# Patient Record
Sex: Female | Born: 1990 | Race: White | Hispanic: No | Marital: Married | Smoking: Former smoker
Health system: Southern US, Community
[De-identification: ages and names within clinical notes are randomized; demographics above are authoritative.]

## PROBLEM LIST (undated history)

## (undated) ENCOUNTER — Inpatient Hospital Stay (HOSPITAL_COMMUNITY): Payer: Self-pay

## (undated) DIAGNOSIS — D649 Anemia, unspecified: Secondary | ICD-10-CM

## (undated) DIAGNOSIS — R87629 Unspecified abnormal cytological findings in specimens from vagina: Secondary | ICD-10-CM

## (undated) DIAGNOSIS — N390 Urinary tract infection, site not specified: Secondary | ICD-10-CM

## (undated) DIAGNOSIS — B009 Herpesviral infection, unspecified: Secondary | ICD-10-CM

## (undated) DIAGNOSIS — O26899 Other specified pregnancy related conditions, unspecified trimester: Secondary | ICD-10-CM

## (undated) DIAGNOSIS — O039 Complete or unspecified spontaneous abortion without complication: Secondary | ICD-10-CM

## (undated) DIAGNOSIS — Z6791 Unspecified blood type, Rh negative: Secondary | ICD-10-CM

## (undated) HISTORY — DX: Complete or unspecified spontaneous abortion without complication: O03.9

## (undated) HISTORY — PX: TONSILLECTOMY: SUR1361

## (undated) HISTORY — DX: Herpesviral infection, unspecified: B00.9

## (undated) HISTORY — DX: Unspecified abnormal cytological findings in specimens from vagina: R87.629

---
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
## 2008-05-20 ENCOUNTER — Ambulatory Visit: Payer: Self-pay | Admitting: *Deleted

## 2008-05-20 ENCOUNTER — Inpatient Hospital Stay (HOSPITAL_COMMUNITY): Admission: AD | Admit: 2008-05-20 | Discharge: 2008-05-24 | Payer: Self-pay | Admitting: *Deleted

## 2008-08-12 ENCOUNTER — Inpatient Hospital Stay (HOSPITAL_COMMUNITY): Admission: AD | Admit: 2008-08-12 | Discharge: 2008-08-15 | Payer: Self-pay | Admitting: *Deleted

## 2008-08-12 ENCOUNTER — Other Ambulatory Visit: Payer: Self-pay | Admitting: *Deleted

## 2008-08-12 ENCOUNTER — Ambulatory Visit: Payer: Self-pay | Admitting: *Deleted

## 2010-06-18 LAB — URINALYSIS, ROUTINE W REFLEX MICROSCOPIC
Bilirubin Urine: NEGATIVE
Glucose, UA: NEGATIVE mg/dL
Hgb urine dipstick: NEGATIVE
Ketones, ur: NEGATIVE mg/dL
pH: 7.5 (ref 5.0–8.0)

## 2010-06-18 LAB — URINE MICROSCOPIC-ADD ON

## 2010-06-18 LAB — DIFFERENTIAL
Eosinophils Absolute: 0.1 10*3/uL (ref 0.0–0.7)
Eosinophils Relative: 1 % (ref 0–5)
Lymphs Abs: 2 10*3/uL (ref 0.7–4.0)
Monocytes Relative: 5 % (ref 3–12)

## 2010-06-18 LAB — RAPID URINE DRUG SCREEN, HOSP PERFORMED
Barbiturates: NOT DETECTED
Benzodiazepines: NOT DETECTED
Cocaine: NOT DETECTED

## 2010-06-18 LAB — CBC
HCT: 37.7 % (ref 36.0–46.0)
MCV: 79.1 fL (ref 78.0–100.0)
Platelets: 225 10*3/uL (ref 150–400)
RBC: 4.77 MIL/uL (ref 3.87–5.11)
WBC: 8.1 10*3/uL (ref 4.0–10.5)

## 2010-06-18 LAB — POCT I-STAT, CHEM 8
BUN: 12 mg/dL (ref 6–23)
Calcium, Ion: 1.23 mmol/L (ref 1.12–1.32)
Creatinine, Ser: 1 mg/dL (ref 0.4–1.2)
TCO2: 24 mmol/L (ref 0–100)

## 2010-06-21 LAB — DRUGS OF ABUSE SCREEN W/O ALC, ROUTINE URINE
Amphetamine Screen, Ur: NEGATIVE
Barbiturate Quant, Ur: NEGATIVE
Creatinine,U: 180.6 mg/dL
Marijuana Metabolite: NEGATIVE
Propoxyphene: NEGATIVE

## 2010-06-21 LAB — URINALYSIS, ROUTINE W REFLEX MICROSCOPIC
Glucose, UA: NEGATIVE mg/dL
Nitrite: NEGATIVE
Specific Gravity, Urine: 1.022 (ref 1.005–1.030)
pH: 6.5 (ref 5.0–8.0)

## 2010-06-21 LAB — CBC
HCT: 36 % (ref 36.0–46.0)
MCHC: 34.7 g/dL (ref 30.0–36.0)
MCV: 78.1 fL (ref 78.0–100.0)
RBC: 4.61 MIL/uL (ref 3.87–5.11)

## 2010-06-21 LAB — COMPREHENSIVE METABOLIC PANEL
AST: 25 U/L (ref 0–37)
BUN: 11 mg/dL (ref 6–23)
CO2: 25 mEq/L (ref 19–32)
Calcium: 9.5 mg/dL (ref 8.4–10.5)
Creatinine, Ser: 0.81 mg/dL (ref 0.4–1.2)
GFR calc Af Amer: >60 mL/min (ref >60)
GFR calc non Af Amer: >60 mL/min (ref >60)

## 2010-06-21 LAB — DIFFERENTIAL
Eosinophils Relative: 2 % (ref 0–5)
Lymphocytes Relative: 31 % (ref 12–46)
Lymphs Abs: 2.5 10*3/uL (ref 0.7–4.0)
Neutro Abs: 4.8 10*3/uL (ref 1.7–7.7)
Neutrophils Relative %: 60 % (ref 43–77)

## 2010-06-21 LAB — URINE MICROSCOPIC-ADD ON

## 2010-06-21 LAB — TSH: TSH: 2.243 u[IU]/mL (ref 0.350–4.500)

## 2010-07-24 NOTE — Discharge Summary (Signed)
[name], [name] NO.:  [id number]   MEDICAL RECORD NO.:  [id number]          PATIENT TYPE:  IPS   LOCATION:  0307                          FACILITY:  [facility]   PHYSICIAN:  Jasmine Pang, M.D. DATE OF BIRTH:  February 27, 1991   DATE OF ADMISSION:  08/12/2008  DATE OF DISCHARGE:  08/15/2008                               DISCHARGE SUMMARY   IDENTIFYING INFORMATION:  A 20 year old female, single.  This is a  voluntary admission.   HISTORY OF PRESENT ILLNESS:  This the second [facility] admission for this 34-  year-old mother of an 95-month-old daughter who took an intentional  overdose of 100 mg of Lexapro and about 25 tablets of Vistaril 25 mg.  She reports that she was agitated after 2-3 days of first arguing with  her mother, then her mother refusing to call her back and ignoring her.  She has had a conflictual relationship with her mother who she  recognizes is a substance abuser and has issues of manipulating the  relationship on a chronic basis.  Kristina [name] regrets her overdose.  Recognizes that her reaction was inappropriate.  She had several weeks  ago stopped going to counseling about the relationship with her mother  and had also stopped her Lexapro.   PAST PSYCHIATRIC HISTORY:  Second [facility] admission.  Previously here in  March 2010.  At that time, she was discharged on Lexapro 10 mg daily and  was referred for counseling.  She kept only a couple of appointments and  did not follow through.  She has a history of depression centering  around the chronically conflictual relationship with her mother.  She  also became pregnant; her boyfriend left her, and she is raising the 42-  month-old daughter with the support of her aunt and uncle with whom she  lives.  She is a Engineer, agricultural.   SOCIAL HISTORY:  Single Caucasian female, unemployed, raising an 20-month-  old daughter named Kristina [name], living with supportive aunt and uncle.   FAMILY HISTORY:  Mother with history of mood  problems and substance  abuse.   MEDICAL HISTORY:  Primary care [facility] is her OB/GYN, Dr. Tamela Oddi.  Medical problems are status post SSRI and anticholinergic  overdose.   CURRENT MEDICATIONS:  None.   DRUG ALLERGIES:  None.   PHYSICAL EXAMINATION:  GENERAL:  Physical exam done in the emergency  room as noted in the record.   DIAGNOSTIC STUDIES:  Remarkable for normal CBC and electrolytes,  acetaminophen level less than 10, salicylate level less than 4, alcohol  level less than 5 and routine urinalysis normal.  Urine drug screen was  negative for all substances.   ADMITTING MENTAL STATUS EXAMINATION:  Revealed a fully alert female in  full contact with reality, good insight, good eye contact, cooperative,  regretting her overdose, recognized that her reaction was inappropriate,  wanting to be home with her daughter, wanting to resume outpatient  counseling to cope better.  No evidence of psychosis or homicidal  thought.   ADMITTING DIAGNOSES:  AXIS I:  Depressive disorder,  not otherwise  specified.  AXIS II:  No diagnosis.  AXIS III:  Status post hydroxyzine and selective serotonin reuptake  inhibitor overdose.  AXIS IV:  Moderate chronic relationship stress.  AXIS V:  Current 54, past year 64.   COURSE OF HOSPITALIZATION:  The patient was admitted to our grief and  loss group.  We were able to hear from her family with whom our social  worker spoke.  She wants to follow up with counseling and felt that it  was not necessarily helpful to be on the Lexapro, was not sure that it  really helped her, felt that she got more benefit from the counseling  sessions.  We did restart her on some trazodone to help her sleep at  night.  She did have some post-overdose headache and was given ibuprofen  600 mg q.6 h. p.r.n. which controlled her symptoms.  By Monday, August 15, 2008, she was having no suicidal thoughts, up and about, participating  in all activities, headache  resolved, was appropriate with staff and  peers and good participation in groups, requesting to go home and follow  up with counseling.   DISCHARGE MENTAL STATUS EXAMINATION:  Fully alert, good eye contact,  good grooming.  Speech normal, giving a coherent history.  Insight  improved.  No dangerous thoughts, asking to go home to care for her  daughter.   DISCHARGE DIAGNOSES:  AXIS I:  Depressive disorder, not otherwise  specified.  AXIS II:  No diagnosis.  AXIS III:  Status post anticholinergic and selective serotonin reuptake  inhibitor overdose.  AXIS IV:  Moderate chronic relationship stressors and parenting  stressors with limited resources.  AXIS V:  Current 60, past year 74.   PLAN:  Discharge her today.  She is going home on no medications and  will follow up with [facility] [place] for counseling.  Their phone number is [phone].      Margaret A. Lorin Picket, N.P.      Jasmine Pang, M.D.  Electronically Signed    MAS/MEDQ  D:  08/15/2008  T:  08/15/2008  Job:  [id number]

## 2010-07-27 NOTE — Discharge Summary (Signed)
Kristina [name], CAUTHON NO.:  [id number]   MEDICAL RECORD NO.:  [id number]          PATIENT TYPE:  IPS   LOCATION:  0300                          FACILITY:  [facility]   PHYSICIAN:  Jasmine Pang, M.D. DATE OF BIRTH:  Jul 30, 1990   DATE OF ADMISSION:  05/20/2008  DATE OF DISCHARGE:  05/24/2008                               DISCHARGE SUMMARY   IDENTIFYING INFORMATION:  This was a 20 year old single white female who  was admitted on a voluntary basis on May 20, 2008.   HISTORY OF PRESENT ILLNESS:  The patient tried to overdose on an unknown  amount of pills 1 week ago.  She got no treatment for this.  This is the  first [facility] admission for her.  She reports being overwhelmed by multiple  stressors in her life.  She describes a lot of drama.  She states her  mother was using drugs.  So, the patient lives with her aunt.  She was  falsely accused of stealing from her step mother and accused her of  running away.  She had panic attacks 2 times a week.  She states she is  constantly anxious.  She has a 72-month-old daughter and baby's father  has abandoned home.   PAST PSYCHIATRIC HISTORY:  The patient previously took Adderall and  Ritalin.  She lost her appetite, was withdrawn though it helped with  concentration.  She states she has attempted to kill herself before.   FAMILY HISTORY:  Mother has a history of substance abuse.   ALCOHOL AND DRUG HISTORY:  The patient did cocaine for 3 months 1 year  ago.  She denies any alcohol now.  Two weeks ago, she did use THC.   MEDICAL PROBLEMS:  None.   MEDICATIONS:  Depo-Provera, due May 23, 2008.   ALLERGIES:  No known drug allergies.   PHYSICAL FINDINGS:  There were no acute physical or medical problems  noted on exam.   HOSPITAL COURSE:  Upon admission, the patient was started on Ambien 5 mg  p.o. q.h.s. p.r.n. and nicotine 14 mg patch daily per protocol.  On  May 21, 2008, the patient's CBC with differential was within  normal  limits.  Comprehensive metabolic panel was grossly within normal limits  except for an elevated glucose at 103.  TSH was within normal limits at  2.243 (0.35 to 4.5).  Urinalysis was negative for except for moderate  leukocytes, wbc's were 7-10 per high-power field.  Urine drug screen was  negative.  She was not having any symptoms of bladder infection and at  this point was not treated for one.  On May 23, 2008, she was started  on Depo-Provera, may use own supply.  In individual sessions, the  patient was friendly and cooperative.  She stated she had not been  sleeping well.  Prior to admission, she stated she has taken an overdose  for 5 times and randomly cuts herself.  On May 22, 2008, the patient  stated she had panic attack when her mother started to live, was  controlled with p.r.n.  medicines.  She states she got overwhelmed.  Lexapro 5 mg p.o. daily was begun along with Ativan 1 mg p.o. q.6 hours  p.r.n. anxiety.  She was tolerating these medications well.  She remains  concerned that she cannot handle all the stressors at home and multiple  demands and criticisms.  She worries about her mother's health.  On  May 23, 2008, sleep was good.  Mood was less depressed, less anxious.  There was no suicidal ideation.  Mother visited today and she enjoyed  this.  The patient will go home to live with her aunt and uncle.  It was  anticipated that she would be discharged tomorrow.  On May 24, 2008,  the patient's sleep was good, appetite was good.  Mood was less  depressed and less anxious.  Affect was consistent with mood.  There was  no suicidal or homicidal ideation.  No thoughts of self-injurious  behavior.  No auditory or visual hallucinations.  No paranoia or  delusions.  Thoughts were logical and goal-directed, thought content.  No predominant theme.  Cognitive was grossly intact.  Insight good,  judgment good, impulse control good.  It was felt the patient was safe   for discharge.  She was started on Vistaril 25 mg p.o. q.6 hours p.r.n.  anxiety (since she asked for medication for anxiety).   DISCHARGE DIAGNOSES:  Axis I:  Depressive disorder, not otherwise  specified.  Axis II:  None.  Axis III:  No diagnosis.  Axis IV:  Severe (family and social support issues, burden of  psychiatric illness).  Axis V:  Global assessment of functioning on discharge was 55.  GAF upon  admission was 48.  GAF highest past year was 65.   DISCHARGE PLANS:  There was no specific activity level or dietary  restrictions.   POSTHOSPITAL CARE PLANS:  The patient will go to [facility] on  May 31, 2008, at 10 a.m.  She will also go to [facility]  [place] for therapy.   DISCHARGE MEDICATIONS:  1. Lexapro was increased to 10 mg p.o. daily.  2. Ambien 5 mg at bedtime.  3. Vistaril 25 mg every 6 hours if needed for anxiety.      Jasmine Pang, M.D.  Electronically Signed     BHS/MEDQ  D:  05/25/2008  T:  05/26/2008  Job:  [id number]

## 2010-11-30 LAB — URINALYSIS, ROUTINE W REFLEX MICROSCOPIC
Bilirubin Urine: NEGATIVE
Hgb urine dipstick: NEGATIVE
Ketones, ur: NEGATIVE
Nitrite: NEGATIVE
Urobilinogen, UA: 0.2

## 2010-11-30 LAB — CBC
HCT: 34.3 — ABNORMAL LOW
Hemoglobin: 11.9 — ABNORMAL LOW
MCV: 83.6
Platelets: 196
WBC: 6.9

## 2010-11-30 LAB — RH IMMUNE GLOBULIN WORKUP (NOT WOMEN'S HOSP)
ABO/RH(D): A NEG
Antibody Screen: NEGATIVE

## 2010-11-30 LAB — WET PREP, GENITAL

## 2010-11-30 LAB — POCT PREGNANCY, URINE: Operator id: 27324

## 2010-12-06 LAB — URINALYSIS, ROUTINE W REFLEX MICROSCOPIC
Bilirubin Urine: NEGATIVE
Glucose, UA: 100 — AB
Hgb urine dipstick: NEGATIVE
Specific Gravity, Urine: 1.01
Urobilinogen, UA: 0.2

## 2010-12-06 LAB — RH IMMUNE GLOBULIN WORKUP (NOT WOMEN'S HOSP)
ABO/RH(D): A NEG
Antibody Screen: NEGATIVE

## 2010-12-06 LAB — URINE MICROSCOPIC-ADD ON

## 2010-12-10 LAB — RH IMMUNE GLOB WKUP(>/=20WKS)(NOT WOMEN'S HOSP): Fetal Screen: NEGATIVE

## 2010-12-10 LAB — CBC
HCT: 34 — ABNORMAL LOW
HCT: 25.5 — ABNORMAL LOW
Hemoglobin: 11.2 — ABNORMAL LOW
Hemoglobin: 13.2
Hemoglobin: 8.3 — ABNORMAL LOW
MCHC: 33.1
MCHC: 33.2
MCHC: 32.4
MCV: 84.7
MCV: 85.2
Platelets: 160
Platelets: 112 — ABNORMAL LOW
RBC: 4.01
RBC: 4.73
RDW: 15.8 — ABNORMAL HIGH
RDW: 16.2 — ABNORMAL HIGH
WBC: 12.1 — ABNORMAL HIGH
WBC: 9.5

## 2010-12-10 LAB — RPR: RPR Ser Ql: NONREACTIVE

## 2010-12-12 LAB — RH IMMUNE GLOBULIN WORKUP (NOT WOMEN'S HOSP)
ABO/RH(D): A NEG
Antibody Screen: POSITIVE
DAT, IgG: NEGATIVE

## 2010-12-26 LAB — POCT URINALYSIS DIP (DEVICE)
Bilirubin Urine: NEGATIVE
Glucose, UA: NEGATIVE
Hgb urine dipstick: NEGATIVE
Operator id: 239701
Specific Gravity, Urine: 1.01
Urobilinogen, UA: 0.2

## 2013-02-18 ENCOUNTER — Other Ambulatory Visit: Payer: Self-pay | Admitting: Obstetrics & Gynecology

## 2013-02-18 DIAGNOSIS — O3680X0 Pregnancy with inconclusive fetal viability, not applicable or unspecified: Secondary | ICD-10-CM

## 2013-02-22 ENCOUNTER — Ambulatory Visit (INDEPENDENT_AMBULATORY_CARE_PROVIDER_SITE_OTHER): Payer: Medicaid Other

## 2013-02-22 ENCOUNTER — Other Ambulatory Visit: Payer: Self-pay | Admitting: Obstetrics & Gynecology

## 2013-02-22 ENCOUNTER — Encounter: Payer: Self-pay | Admitting: Obstetrics & Gynecology

## 2013-02-22 DIAGNOSIS — O3680X0 Pregnancy with inconclusive fetal viability, not applicable or unspecified: Secondary | ICD-10-CM

## 2013-02-22 NOTE — Progress Notes (Signed)
U/S(10+2wks)-transvaginal u/s performed, retroverted uterus noted, single IUP with +FCA noted, FHR- 189bpm, cx long and closed, bilateral adnexa wnl with C.L. Noted on Lt-3.3 x 3.2cm, CRL c/w LMP dates

## 2013-02-24 ENCOUNTER — Other Ambulatory Visit: Payer: Self-pay | Admitting: Obstetrics & Gynecology

## 2013-02-24 DIAGNOSIS — Z36 Encounter for antenatal screening of mother: Secondary | ICD-10-CM

## 2013-03-02 ENCOUNTER — Encounter: Payer: Self-pay | Admitting: Women's Health

## 2013-03-02 ENCOUNTER — Telehealth: Payer: Self-pay | Admitting: Obstetrics & Gynecology

## 2013-03-02 ENCOUNTER — Ambulatory Visit (INDEPENDENT_AMBULATORY_CARE_PROVIDER_SITE_OTHER): Payer: Medicaid Other | Admitting: Women's Health

## 2013-03-02 VITALS — BP 128/60 | Ht 67.0 in | Wt 221.2 lb

## 2013-03-02 DIAGNOSIS — L52 Erythema nodosum: Secondary | ICD-10-CM

## 2013-03-02 DIAGNOSIS — O9989 Other specified diseases and conditions complicating pregnancy, childbirth and the puerperium: Secondary | ICD-10-CM

## 2013-03-02 NOTE — Telephone Encounter (Signed)
Pt states had raised, red knots, with heat on bilateral legs for several days. Pt states has only been seen in our office for ob u/s. Per Cyril Mourning, NP pt to be seen. Informed pt to come in now to be worked in to schedule. Pt verbalized understanding.

## 2013-03-02 NOTE — Progress Notes (Signed)
Patient ID: Kristina [name], female   DOB: [birth year], 22 y.o.   MRN: [id number]   [facility] Visit  Patient name: Kristina [name] MRN [id number]  Date of birth: 05-May-1990  CC & HPI:  Kristina [name] is a 22 y.o. G1P0 Caucasian female at [redacted]w[redacted]d presenting today w/ report of red painful bumps on bilateral shins x few weeks.  The redness increases at night. They do not itch, have not come to a head, denies fever/chills, any change in medications other than beginning diclegis for n/v, but bumps were there prior to beginning this. Has never had this before. States her family was concerned, so she saw her PCP yesterday who told her she didn't know what they were. She has not seen a dermatologist.  She is scheduled next week for her new ob visit.   Pertinent History Reviewed:  Medical & Surgical Hx:   History reviewed. No pertinent past medical history. Past Surgical History  Procedure Laterality Date  . Tonsilectomy, adenoidectomy, bilateral myringotomy and tubes     Medications: Reviewed & Updated - see associated section Social History: Reviewed -  reports that she has quit smoking. She has never used smokeless tobacco.  Objective Findings:  Vitals: BP 128/60  Ht 5\' 7"  (1.702 m)  Wt 221 lb 3.2 oz (100.336 kg)  BMI 34.64 kg/m2  LMP 12/12/2012  Physical Examination: General appearance - alert, well appearing, and in no distress Extremities: multiple large ~2cm raised tender erythematous bumps bilateral distal shins, not hot to touch, no heads. Co-exam w/ JAG, possibly erythema nodosum?   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 Margo Aye to make appt, but office closed  To elevate legs, can try zyrtec or claritin if she would like  Pt to call Dr. Margo Aye for appt  To notify us/go to hospital if areas worsen, she develops fever/chills, etc  F/U 1wk for new ob visit as scheduled   Marge Duncans CNM,  [facility] 03/02/2013 2:43 PM

## 2013-03-02 NOTE — Patient Instructions (Signed)
Erythema Nodosum Erythema nodosum is also called "painful red nodules on the legs". Symptoms can include sudden onset of fever, fatigue and joint pains. Red, deep, tender, raised, bruise-like bumps form on the shin, or sometimes on the arms or trunk. The skin over the bumps is usually shiny. The symptoms usually last 1-2 weeks. The symptoms usually improve once the cause is treated. This illness may be caused by strep or fungal infection, drug reaction, pregnancy, or other medical conditions. Treating the cause is important to early recovery. Erythema nodosum occurs at any age and in both sexes but more often in young adult women. Erythema nodosum is not a skin infection. It is a reaction to something internal. In most cases the illness and bumps go away with treatment. You should avoid any medicine that may have caused this reaction. Antihistamine drugs, anti-inflammatory drugs or cortisone medicine may be prescribed to relieve symptoms. SSKI drops ([place]) taken with juice at breakfast, lunch and dinner may have an anti-inflammatory effect and speed the healing. Bed rest and limiting vigorous exercise help to shorten the course of erythema nodosum. Elevation of the affected limb also helps in recovery. As the condition gets better, the bumps flatten and your skin may heal with temporary bruise marks. These dark marks will clear up in several months and they are a good sign that your skin is healing.  SEEK IMMEDIATE MEDICAL CARE IF:   Your condition worsens, or if you have more severe symptoms such a high fever, sore throat, or repeated vomiting. Document Released: 04/04/2004 Document Revised: 05/20/2011 Document Reviewed: 04/08/2008 [facility] Patient Information 2014 [place], [place].  Nausea & Vomiting  Have saltine crackers or pretzels by your bed and eat a few bites before you raise your head out of bed in the morning  Eat small frequent meals throughout the day instead of large meals  Drink plenty  of fluids throughout the day to stay hydrated, just don't drink a lot of fluids with your meals.  This can make your stomach fill up faster making you feel sick  Do not brush your teeth right after you eat  Products with real ginger are good for nausea, like ginger ale and ginger hard candy Make sure it says made with real ginger!  Sucking on sour candy like lemon heads is also good for nausea  If your prenatal vitamins make you nauseated, take them at night so you will sleep through the nausea  If you feel like you need medicine for the nausea & vomiting please let us know  If you are unable to keep any fluids or food down please let us know    Pregnancy - First Trimester During sexual intercourse, millions of sperm go into the vagina. Only 1 sperm will penetrate and fertilize the female egg while it is in the Fallopian tube. One week later, the fertilized egg implants into the wall of the uterus. An embryo begins to develop into a baby. At 6 to 8 weeks, the eyes and face are formed and the heartbeat can be seen on ultrasound. At the end of 12 weeks (first trimester), all the baby's organs are formed. Now that you are pregnant, you will want to do everything you can to have a healthy baby. Two of the most important things are to get good prenatal care and follow your caregiver's instructions. Prenatal care is all the medical care you receive before the baby's birth. It is given to prevent, find, and treat problems during the pregnancy  and childbirth. PRENATAL EXAMS  During prenatal visits, your weight, blood pressure, and urine are checked. This is done to make sure you are healthy and progressing normally during the pregnancy.  A pregnant woman should gain 25 to 35 pounds during the pregnancy. However, if you are overweight or underweight, your caregiver will advise you regarding your weight.  Your caregiver will ask and answer questions for you.  Blood work, cervical cultures, other  necessary tests, and a Pap test are done during your prenatal exams. These tests are done to check on your health and the probable health of your baby. Tests are strongly recommended and done for HIV with your permission. This is the virus that causes AIDS. These tests are done because medicines can be given to help prevent your baby from being born with this infection should you have been infected without knowing it. Blood work is also used to find out your blood type, previous infections, and follow your blood levels (hemoglobin).  Low hemoglobin (anemia) is common during pregnancy. Iron and vitamins are given to help prevent this. Later in the pregnancy, blood tests for diabetes will be done along with any other tests if any problems develop.  You may need other tests to make sure you and the baby are doing well. CHANGES DURING THE FIRST TRIMESTER  Your body goes through many changes during pregnancy. They vary from person to person. Talk to your caregiver about changes you notice and are concerned about. Changes can include:  Your menstrual period stops.  The egg and sperm carry the genes that determine what you look like. Genes from you and your partner are forming a baby. The female genes determine whether the baby is a boy or a girl.  Your body increases in girth and you may feel bloated.  Feeling sick to your stomach (nauseous) and throwing up (vomiting). If the vomiting is uncontrollable, call your caregiver.  Your breasts will begin to enlarge and become tender.  Your nipples may stick out more and become darker.  The need to urinate more. Painful urination may mean you have a bladder infection.  Tiring easily.  Loss of appetite.  Cravings for certain kinds of food.  At first, you may gain or lose a couple of pounds.  You may have changes in your emotions from day to day (excited to be pregnant or concerned something may go wrong with the pregnancy and baby).  You may have  more vivid and strange dreams. HOME CARE INSTRUCTIONS   It is very important to avoid all smoking, alcohol and non-prescribed drugs during your pregnancy. These affect the formation and growth of the baby. Avoid chemicals while pregnant to ensure the delivery of a healthy infant.  Start your prenatal visits by the 12th week of pregnancy. They are usually scheduled monthly at first, then more often in the last 2 months before delivery. Keep your caregiver's appointments. Follow your caregiver's instructions regarding medicine use, blood and lab tests, exercise, and diet.  During pregnancy, you are providing food for you and your baby. Eat regular, well-balanced meals. Choose foods such as meat, fish, milk and other low fat dairy products, vegetables, fruits, and whole-grain breads and cereals. Your caregiver will tell you of the ideal weight gain.  You can help morning sickness by keeping soda crackers at the bedside. Eat a couple before arising in the morning. You may want to use the crackers without salt on them.  Eating 4 to 5 small meals rather  than 3 large meals a day also may help the nausea and vomiting.  Drinking liquids between meals instead of during meals also seems to help nausea and vomiting.  A physical sexual relationship may be continued throughout pregnancy if there are no other problems. Problems may be early (premature) leaking of amniotic fluid from the membranes, vaginal bleeding, or belly (abdominal) pain.  Exercise regularly if there are no restrictions. Check with your caregiver or physical therapist if you are unsure of the safety of some of your exercises. Greater weight gain will occur in the last 2 trimesters of pregnancy. Exercising will help:  Control your weight.  Keep you in shape.  Prepare you for labor and delivery.  Help you lose your pregnancy weight after you deliver your baby.  Wear a good support or jogging bra for breast tenderness during pregnancy.  This may help if worn during sleep too.  Ask when prenatal classes are available. Begin classes when they are offered.  Do not use hot tubs, steam rooms, or saunas.  Wear your seat belt when driving. This protects you and your baby if you are in an accident.  Avoid raw meat, uncooked cheese, cat litter boxes, and soil used by cats throughout the pregnancy. These carry germs that can cause birth defects in the baby.  The first trimester is a good time to visit your dentist for your dental health. Getting your teeth cleaned is okay. Use a softer toothbrush and brush gently during pregnancy.  Ask for help if you have financial, counseling, or nutritional needs during pregnancy. Your caregiver will be able to offer counseling for these needs as well as refer you for other special needs.  Do not take any medicines or herbs unless told by your caregiver.  Inform your caregiver if there is any mental or physical domestic violence.  Make a list of emergency phone numbers of family, friends, hospital, and police and fire departments.  Write down your questions. Take them to your prenatal visit.  Do not douche.  Do not cross your legs.  If you have to stand for long periods of time, rotate you feet or take small steps in a circle.  You may have more vaginal secretions that may require a sanitary pad. Do not use tampons or scented sanitary pads. MEDICINES AND DRUG USE IN PREGNANCY  Take prenatal vitamins as directed. The vitamin should contain 1 milligram of folic acid. Keep all vitamins out of reach of children. Only a couple vitamins or tablets containing iron may be fatal to a baby or young child when ingested.  Avoid use of all medicines, including herbs, over-the-counter medicines, not prescribed or suggested by your caregiver. Only take over-the-counter or prescription medicines for pain, discomfort, or fever as directed by your caregiver. Do not use aspirin, ibuprofen, or naproxen unless  directed by your caregiver.  Let your caregiver also know about herbs you may be using.  Alcohol is related to a number of birth defects. This includes fetal alcohol syndrome. All alcohol, in any form, should be avoided completely. Smoking will cause low birth rate and premature babies.  Street or illegal drugs are very harmful to the baby. They are absolutely forbidden. A baby born to an addicted mother will be addicted at birth. The baby will go through the same withdrawal an adult does.  Let your caregiver know about any medicines that you have to take and for what reason you take them. SEEK MEDICAL CARE IF:  You have  any concerns or worries during your pregnancy. It is better to call with your questions if you feel they cannot wait, rather than worry about them. SEEK IMMEDIATE MEDICAL CARE IF:   An unexplained oral temperature above 102 F (38.9 C) develops, or as your caregiver suggests.  You have leaking of fluid from the vagina (birth canal). If leaking membranes are suspected, take your temperature and inform your caregiver of this when you call.  There is vaginal spotting or bleeding. Notify your caregiver of the amount and how many pads are used.  You develop a bad smelling vaginal discharge with a change in the color.  You continue to feel sick to your stomach (nauseated) and have no relief from remedies suggested. You vomit blood or coffee ground-like materials.  You lose more than 2 pounds of weight in 1 week.  You gain more than 2 pounds of weight in 1 week and you notice swelling of your face, hands, feet, or legs.  You gain 5 pounds or more in 1 week (even if you do not have swelling of your hands, face, legs, or feet).  You get exposed to [place] measles and have never had them.  You are exposed to fifth disease or chickenpox.  You develop belly (abdominal) pain. Round ligament discomfort is a common non-cancerous (benign) cause of abdominal pain in pregnancy. Your  caregiver still must evaluate this.  You develop headache, fever, diarrhea, pain with urination, or shortness of breath.  You fall or are in a car accident or have any kind of trauma.  There is mental or physical violence in your home. Document Released: 02/19/2001 Document Revised: 11/20/2011 Document Reviewed: 08/23/2008 [facility] Patient Information 2014 [place], [place].

## 2013-03-09 ENCOUNTER — Other Ambulatory Visit (HOSPITAL_COMMUNITY)
Admission: RE | Admit: 2013-03-09 | Discharge: 2013-03-09 | Disposition: A | Discharge status: Home / Self Care | Payer: Medicaid Other | Source: Ambulatory Visit | Attending: Obstetrics and Gynecology | Admitting: Obstetrics and Gynecology

## 2013-03-09 ENCOUNTER — Ambulatory Visit (INDEPENDENT_AMBULATORY_CARE_PROVIDER_SITE_OTHER): Payer: Medicaid Other

## 2013-03-09 ENCOUNTER — Encounter: Payer: Self-pay | Admitting: Advanced Practice Midwife

## 2013-03-09 ENCOUNTER — Other Ambulatory Visit: Payer: Self-pay | Admitting: Advanced Practice Midwife

## 2013-03-09 ENCOUNTER — Ambulatory Visit (INDEPENDENT_AMBULATORY_CARE_PROVIDER_SITE_OTHER): Payer: Medicaid Other | Admitting: Advanced Practice Midwife

## 2013-03-09 VITALS — BP 120/60 | Wt 217.0 lb

## 2013-03-09 DIAGNOSIS — Z331 Pregnant state, incidental: Secondary | ICD-10-CM

## 2013-03-09 DIAGNOSIS — Z36 Encounter for antenatal screening of mother: Secondary | ICD-10-CM

## 2013-03-09 DIAGNOSIS — Z1389 Encounter for screening for other disorder: Secondary | ICD-10-CM

## 2013-03-09 DIAGNOSIS — Z3480 Encounter for supervision of other normal pregnancy, unspecified trimester: Secondary | ICD-10-CM | POA: Insufficient documentation

## 2013-03-09 DIAGNOSIS — Z01419 Encounter for gynecological examination (general) (routine) without abnormal findings: Secondary | ICD-10-CM | POA: Insufficient documentation

## 2013-03-09 DIAGNOSIS — O360990 Maternal care for other rhesus isoimmunization, unspecified trimester, not applicable or unspecified: Secondary | ICD-10-CM

## 2013-03-09 DIAGNOSIS — Z3490 Encounter for supervision of normal pregnancy, unspecified, unspecified trimester: Secondary | ICD-10-CM

## 2013-03-09 LAB — POCT URINALYSIS DIPSTICK
Blood, UA: NEGATIVE
Glucose, UA: NEGATIVE
Leukocytes, UA: NEGATIVE
Nitrite, UA: NEGATIVE

## 2013-03-09 LAB — CBC
HCT: 38 % (ref 36.0–46.0)
MCV: 80.9 fL (ref 78.0–100.0)
Platelets: 241 10*3/uL (ref 150–400)
RBC: 4.7 MIL/uL (ref 3.87–5.11)
RDW: 16.3 % — ABNORMAL HIGH (ref 11.5–15.5)
WBC: 8.4 10*3/uL (ref 4.0–10.5)

## 2013-03-09 MED ORDER — DOXYLAMINE-PYRIDOXINE 10-10 MG PO TBEC: 2.0000 | DELAYED_RELEASE_TABLET | Freq: Every day | ORAL | Status: DC

## 2013-03-09 NOTE — Progress Notes (Signed)
  Subjective:    Kristina [name] is a G2P1001 [redacted]w[redacted]d being seen today for her first obstetrical visit.  Her obstetrical history is significant for nothing.  Pregnancy history fully reviewed. Diclegis helps.  Rx sent  Patient reports no complaints.  Filed Vitals:   03/09/13 1357  BP: 120/60  Weight: 217 lb (98.431 kg)    HISTORY: OB History  Gravida Para Term Preterm AB SAB TAB Ectopic Multiple Living  2 1 1       1     # Outcome Date GA Lbr Len/2nd Weight Sex Delivery Anes PTL Lv  2 CUR           1 TRM 12/09/07             Past Medical History  Diagnosis Date  . Medical history non-contributory    Past Surgical History  Procedure Laterality Date  . Tonsilectomy, adenoidectomy, bilateral myringotomy and tubes     Family History  Problem Relation Age of Onset  . Cancer Maternal Grandmother     breast  . Diabetes Paternal Grandfather      Exam       Pelvic Exam:    Perineum: Normal Perineum   Vulva: normal   Vagina:  normal mucosa, normal discharge, no palpable nodules   Uterus    u/s normal today.  FHR +     Cervix: normal   Adnexa: Not palpable   Urinary:  urethral meatus normal    System: Breast:  normal appearance, no masses or tenderness   Skin: normal coloration and turgor, no rashes    Neurologic: oriented, normal, normal mood   Extremities: normal strength, tone, and muscle mass.  Bilateral erythema nodosum   HEENT PERRLA   Mouth/Teeth mucous membranes moist, pharynx normal without lesions   Neck supple and no masses   Cardiovascular: regular rate and rhythm   Respiratory:  appears well, vitals normal, no respiratory distress, acyanotic, normal RR   Abdomen: soft, non-tender; bowel sounds normal; no masses,  no organomegaly          Assessment:    Pregnancy: G2P1001 Patient Active Problem List   Diagnosis Date Noted  . Pregnant 03/09/2013        Plan:  NT/IT today  Initial labs drawn. Prenatal vitamins. Problem list reviewed and  updated. Genetic Screening discussed Integrated Screen: requested.  Ultrasound discussed; fetal survey: requested.  Follow up in 4 weeks.  CRESENZO-DISHMAN,Zarie Kosiba 03/09/2013

## 2013-03-09 NOTE — Progress Notes (Addendum)
U/S(12+3wks)-single IUP with +FCA noted, FHR-178 bpm, CRL c/w dates, cx long and closed(3.3cm), bilateral adnexa wnl Lt ovary with 3.5cm simple cyst noted, NB present, NT-1.2mm

## 2013-03-10 LAB — URINE CULTURE
Colony Count: NO GROWTH
Organism ID, Bacteria: NO GROWTH

## 2013-03-10 LAB — DRUG SCREEN, URINE, NO CONFIRMATION
Barbiturate Quant, Ur: NEGATIVE
Cocaine Metabolites: NEGATIVE
Marijuana Metabolite: NEGATIVE
Opiate Screen, Urine: NEGATIVE
Phencyclidine (PCP): NEGATIVE

## 2013-03-10 LAB — RPR

## 2013-03-10 LAB — URINALYSIS, ROUTINE W REFLEX MICROSCOPIC
Bilirubin Urine: NEGATIVE
Glucose, UA: NEGATIVE mg/dL
Ketones, ur: NEGATIVE mg/dL
Specific Gravity, Urine: 1.015 (ref 1.005–1.030)
pH: 8 (ref 5.0–8.0)

## 2013-03-10 LAB — ABO AND RH: Rh Type: NEGATIVE

## 2013-03-10 LAB — HEPATITIS B SURFACE ANTIGEN: Hepatitis B Surface Ag: NEGATIVE

## 2013-03-10 LAB — HIV ANTIBODY (ROUTINE TESTING W REFLEX): HIV: NONREACTIVE

## 2013-03-12 LAB — CYSTIC FIBROSIS DIAGNOSTIC STUDY

## 2013-03-15 LAB — MATERNAL SCREEN, INTEGRATED #1

## 2013-04-06 ENCOUNTER — Other Ambulatory Visit: Payer: Self-pay | Admitting: Women's Health

## 2013-04-06 ENCOUNTER — Encounter: Payer: Self-pay | Admitting: Women's Health

## 2013-04-06 ENCOUNTER — Ambulatory Visit (INDEPENDENT_AMBULATORY_CARE_PROVIDER_SITE_OTHER): Payer: Medicaid Other | Admitting: Women's Health

## 2013-04-06 VITALS — BP 122/60 | Wt 214.8 lb

## 2013-04-06 DIAGNOSIS — Z1389 Encounter for screening for other disorder: Secondary | ICD-10-CM

## 2013-04-06 DIAGNOSIS — O360990 Maternal care for other rhesus isoimmunization, unspecified trimester, not applicable or unspecified: Secondary | ICD-10-CM

## 2013-04-06 DIAGNOSIS — Z23 Encounter for immunization: Secondary | ICD-10-CM

## 2013-04-06 DIAGNOSIS — Z331 Pregnant state, incidental: Secondary | ICD-10-CM

## 2013-04-06 DIAGNOSIS — Z6791 Unspecified blood type, Rh negative: Secondary | ICD-10-CM | POA: Insufficient documentation

## 2013-04-06 DIAGNOSIS — R87619 Unspecified abnormal cytological findings in specimens from cervix uteri: Secondary | ICD-10-CM | POA: Insufficient documentation

## 2013-04-06 DIAGNOSIS — O9989 Other specified diseases and conditions complicating pregnancy, childbirth and the puerperium: Secondary | ICD-10-CM

## 2013-04-06 DIAGNOSIS — O99891 Other specified diseases and conditions complicating pregnancy: Secondary | ICD-10-CM

## 2013-04-06 DIAGNOSIS — Z3480 Encounter for supervision of other normal pregnancy, unspecified trimester: Secondary | ICD-10-CM

## 2013-04-06 DIAGNOSIS — O26899 Other specified pregnancy related conditions, unspecified trimester: Secondary | ICD-10-CM

## 2013-04-06 LAB — POCT URINALYSIS DIPSTICK
Glucose, UA: NEGATIVE
Nitrite, UA: NEGATIVE
PROTEIN UA: NEGATIVE

## 2013-04-06 MED ORDER — INFLUENZA VAC SPLIT QUAD 0.5 ML IM SUSP
0.5000 mL | Freq: Once | INTRAMUSCULAR | Status: AC
  Administered 2013-04-06: 0.5 mL via INTRAMUSCULAR

## 2013-04-06 NOTE — Progress Notes (Signed)
Denies uc's, lof, vb, uti s/s. HAs, discussed prevention/relief measures. Had LSIL pap, needs colpo.  Reviewed warning s/s to report.  All questions answered. F/U in 4wks for anatomy u/s, colpo, and visit.  2nd IT today.

## 2013-04-06 NOTE — Patient Instructions (Addendum)
[place] Pediatricians:  [facility] [phone]            [facility] [phone]                 [facility] [phone] (usually doesn't accept new patients unless you have family there already, you are always welcome to call and ask)             [facility] ([address]) [phone]   [facility] Pediatricians:   [facility]: [phone]  [facility]: [phone]   Second Trimester of Pregnancy The second trimester is from week 13 through week 28, months 4 through 6. The second trimester is often a time when you feel your best. Your body has also adjusted to being pregnant, and you begin to feel better physically. Usually, morning sickness has lessened or quit completely, you may have more energy, and you may have an increase in appetite. The second trimester is also a time when the fetus is growing rapidly. At the end of the sixth month, the fetus is about 9 inches long and weighs about 1 pounds. You will likely begin to feel the baby move (quickening) between 18 and 20 weeks of the pregnancy. BODY CHANGES Your body goes through many changes during pregnancy. The changes vary from woman to woman.   Your weight will continue to increase. You will notice your lower abdomen bulging out.  You may begin to get stretch marks on your hips, abdomen, and breasts.  You may develop headaches that can be relieved by medicines approved by your caregiver.  You may urinate more often because the fetus is pressing on your bladder.  You may develop or continue to have heartburn as a result of your pregnancy.  You may develop constipation because certain hormones are causing the muscles that push waste through your intestines to slow down.  You may develop hemorrhoids or swollen, bulging veins (varicose veins).  You may have back pain because of the weight gain and pregnancy  hormones relaxing your joints between the bones in your pelvis and as a result of a shift in weight and the muscles that support your balance.  Your breasts will continue to grow and be tender.  Your gums may bleed and may be sensitive to brushing and flossing.  Dark spots or blotches (chloasma, mask of pregnancy) may develop on your face. This will likely fade after the baby is born.  A dark line from your belly button to the pubic area (linea nigra) may appear. This will likely fade after the baby is born. WHAT TO EXPECT AT YOUR PRENATAL VISITS During a routine prenatal visit:  You will be weighed to make sure you and the fetus are growing normally.  Your blood pressure will be taken.  Your abdomen will be measured to track your baby's growth.  The fetal heartbeat will be listened to.  Any test results from the previous visit will be discussed. Your caregiver may ask you:  How you are feeling.  If you are feeling the baby move.  If you have had any abnormal symptoms, such as leaking fluid, bleeding, severe headaches, or abdominal cramping.  If you have any questions. Other tests that may be performed during your second trimester include:  Blood tests that check for:  Low iron levels (anemia).  Gestational diabetes (between 24 and 28 weeks).  Rh antibodies.  Urine tests to check for infections, diabetes, or protein  in the urine.  An ultrasound to confirm the proper growth and development of the baby.  An amniocentesis to check for possible genetic problems.  Fetal screens for spina bifida and Down syndrome. HOME CARE INSTRUCTIONS   Avoid all smoking, herbs, alcohol, and unprescribed drugs. These chemicals affect the formation and growth of the baby.  Follow your caregiver's instructions regarding medicine use. There are medicines that are either safe or unsafe to take during pregnancy.  Exercise only as directed by your caregiver. Experiencing uterine cramps is a  good sign to stop exercising.  Continue to eat regular, healthy meals.  Wear a good support bra for breast tenderness.  Do not use hot tubs, steam rooms, or saunas.  Wear your seat belt at all times when driving.  Avoid raw meat, uncooked cheese, cat litter boxes, and soil used by cats. These carry germs that can cause birth defects in the baby.  Take your prenatal vitamins.  Try taking a stool softener (if your caregiver approves) if you develop constipation. Eat more high-fiber foods, such as fresh vegetables or fruit and whole grains. Drink plenty of fluids to keep your urine clear or pale yellow.  Take warm sitz baths to soothe any pain or discomfort caused by hemorrhoids. Use hemorrhoid cream if your caregiver approves.  If you develop varicose veins, wear support hose. Elevate your feet for 15 minutes, 3 4 times a day. Limit salt in your diet.  Avoid heavy lifting, wear low heel shoes, and practice good posture.  Rest with your legs elevated if you have leg cramps or low back pain.  Visit your dentist if you have not gone yet during your pregnancy. Use a soft toothbrush to brush your teeth and be gentle when you floss.  A sexual relationship may be continued unless your caregiver directs you otherwise.  Continue to go to all your prenatal visits as directed by your caregiver. SEEK MEDICAL CARE IF:   You have dizziness.  You have mild pelvic cramps, pelvic pressure, or nagging pain in the abdominal area.  You have persistent nausea, vomiting, or diarrhea.  You have a bad smelling vaginal discharge.  You have pain with urination. SEEK IMMEDIATE MEDICAL CARE IF:   You have a fever.  You are leaking fluid from your vagina.  You have spotting or bleeding from your vagina.  You have severe abdominal cramping or pain.  You have rapid weight gain or loss.  You have shortness of breath with chest pain.  You notice sudden or extreme swelling of your face, hands,  ankles, feet, or legs.  You have not felt your baby move in over an hour.  You have severe headaches that do not go away with medicine.  You have vision changes. Document Released: 02/19/2001 Document Revised: 10/28/2012 Document Reviewed: 04/28/2012 [facility] Patient Information 2014 [place], [place].  Colposcopy Colposcopy is a procedure to examine your cervix and vagina, or the area around the outside of your vagina, for abnormalities or signs of disease. The procedure is done using a lighted microscope called a colposcope. Tissue samples may be collected during the colposcopy if your health care provider finds any unusual cells. A colposcopy may be done if a woman has:  An abnormal Pap test. A Pap test is a medical test done to evaluate cells that are on the surface of the cervix.  A Pap test result that is suggestive of human papillomavirus (HPV). This virus can cause genital warts and is linked to the development  of cervical cancer.  A sore on her cervix and the results of a Pap test were normal.  Genital warts on the cervix or in or around the outside of the vagina.  A mother who took the drug diethylstilbestrol (DES) while pregnant.  Painful intercourse.  Vaginal bleeding, especially after sexual intercourse. LET [facility] CARE PROVIDER KNOW ABOUT:  Any allergies you have.  All medicines you are taking, including vitamins, herbs, eye drops, creams, and over-the-counter medicines.  Previous problems you or members of your family have had with the use of anesthetics.  Any blood disorders you have.  Previous surgeries you have had.  Medical conditions you have. RISKS AND COMPLICATIONS Generally, a colposcopy is a safe procedure. However, as with any procedure, complications can occur. Possible complications include:  Bleeding.  Infection.  Missed lesions. BEFORE THE PROCEDURE   Tell your health care provider if you have your menstrual period. A colposcopy  typically is not done during menstruation.  For 24 hours before the colposcopy, do not:  Douche.  Use tampons.  Use medicines, creams, or suppositories in the vagina.  Have sexual intercourse. PROCEDURE  During the procedure, you will be lying on your back with your feet in foot rests (stirrups). A warm metal or plastic instrument (speculum) will be placed in your vagina to keep it open and to allow the health care provider to see the cervix. The colposcope will be placed outside the vagina. It will be used to magnify and examine the cervix, vagina, and the area around the outside of the vagina. A small amount of liquid solution will be placed on the area that is to be viewed. This solution will make it easier to see the abnormal cells. Your health care provider will use tools to suck out mucus and cells from the canal of the cervix. Then he or she will record the location of the abnormal areas. If a biopsy is done during the procedure, a medicine will usually be given to numb the area (local anesthetic). You may feel mild pain or cramping while the biopsy is done. After the procedure, tissue samples collected during the biopsy will be sent to a lab for analysis. AFTER THE PROCEDURE  You will be given instructions on when to follow up with your health care provider for your test results. It is important to keep your appointment. Document Released: 05/18/2002 Document Revised: 10/28/2012 Document Reviewed: 09/24/2012 [facility] Patient Information 2014 [place], [place].  Migraine Headache A migraine headache is an intense, throbbing pain on one or both sides of your head. A migraine can last for 30 minutes to several hours. CAUSES  The exact cause of a migraine headache is not always known. However, a migraine may be caused when nerves in the brain become irritated and release chemicals that cause inflammation. This causes pain. Certain things may also trigger migraines, such  as:  Alcohol.  Smoking.  Stress.  Menstruation.  Aged cheeses.  Foods or drinks that contain nitrates, glutamate, aspartame, or tyramine.  Lack of sleep.  Chocolate.  Caffeine.  Hunger.  Physical exertion.  Fatigue.  Medicines used to treat chest pain (nitroglycerine), birth control pills, estrogen, and some blood pressure medicines. SIGNS AND SYMPTOMS  Pain on one or both sides of your head.  Pulsating or throbbing pain.  Severe pain that prevents daily activities.  Pain that is aggravated by any physical activity.  Nausea, vomiting, or both.  Dizziness.  Pain with exposure to bright lights, loud noises, or  activity.  General sensitivity to bright lights, loud noises, or smells. Before you get a migraine, you may get warning signs that a migraine is coming (aura). An aura may include:  Seeing flashing lights.  Seeing bright spots, halos, or zig-zag lines.  Having tunnel vision or blurred vision.  Having feelings of numbness or tingling.  Having trouble talking.  Having muscle weakness. DIAGNOSIS  A migraine headache is often diagnosed based on:  Symptoms.  Physical exam.  A CT scan or MRI of your head. These imaging tests cannot diagnose migraines, but they can help rule out other causes of headaches. TREATMENT Medicines may be given for pain and nausea. Medicines can also be given to help prevent recurrent migraines.  HOME CARE INSTRUCTIONS  Only take over-the-counter or prescription medicines for pain or discomfort as directed by your health care provider. The use of long-term narcotics is not recommended.  Lie down in a dark, quiet room when you have a migraine.  Keep a journal to find out what may trigger your migraine headaches. For example, write down:  What you eat and drink.  How much sleep you get.  Any change to your diet or medicines.  Limit alcohol consumption.  Quit smoking if you smoke.  Get 7 9 hours of sleep, or as  recommended by your health care provider.  Limit stress.  Keep lights dim if bright lights bother you and make your migraines worse. SEEK IMMEDIATE MEDICAL CARE IF:   Your migraine becomes severe.  You have a fever.  You have a stiff neck.  You have vision loss.  You have muscular weakness or loss of muscle control.  You start losing your balance or have trouble walking.  You feel faint or pass out.  You have severe symptoms that are different from your first symptoms. MAKE SURE YOU:   Understand these instructions.  Will watch your condition.  Will get help right away if you are not doing well or get worse. Document Released: 02/25/2005 Document Revised: 12/16/2012 Document Reviewed: 11/02/2012 [facility] Patient Information 2014 [place], [place].

## 2013-04-13 LAB — MATERNAL SCREEN, INTEGRATED #2
AFP MoM: 0.63
AFP, Serum: 17.1 ng/mL
Age risk Down Syndrome: 1:1000 {titer}
Calculated Gestational Age: 16.3
Crown Rump Length: 60.5 mm
ESTRIOL MOM MAT SCREEN: 0.87
Estriol, Free: 0.68 ng/mL
HCG, SERUM MAT SCREEN: 44 [IU]/mL
Inhibin A Dimeric: 207 pg/mL
Inhibin A MoM: 1.44
MSS Down Syndrome: 1:980 {titer}
NT MoM: 1.02
Nuchal Translucency: 1.42 mm
Number of fetuses: 1
PAPP-A MoM: 0.52
PAPP-A: 282 ng/mL
Rish for ONTD: <1:5000 {titer}
hCG MoM: 1.63

## 2013-04-14 ENCOUNTER — Encounter: Payer: Self-pay | Admitting: Women's Health

## 2013-04-26 ENCOUNTER — Telehealth: Payer: Self-pay

## 2013-04-26 MED ORDER — BUTALBITAL-APAP-CAFFEINE 50-325-40 MG PO TABS: 1.0000 | ORAL_TABLET | Freq: Four times a day (QID) | ORAL | Status: AC | PRN

## 2013-04-26 NOTE — Telephone Encounter (Signed)
Spoke with pt letting her know Rx for Fioricet was faxed to pharmacy. JSY

## 2013-04-26 NOTE — Telephone Encounter (Signed)
Spoke with pt. Has had a headache x 2 days. Has been taking Tylenol but it's not helping now. Only has a history of headaches during pregnancy. What do you advise? Uses [facility] in [place]. Thanks!!!

## 2013-05-04 ENCOUNTER — Other Ambulatory Visit: Payer: Medicaid Other

## 2013-05-04 ENCOUNTER — Encounter: Payer: Medicaid Other | Admitting: Advanced Practice Midwife

## 2013-05-07 ENCOUNTER — Emergency Department (HOSPITAL_COMMUNITY)
Admission: EM | Admit: 2013-05-07 | Discharge: 2013-05-08 | Disposition: A | Discharge status: Home / Self Care | Payer: Medicaid Other | Attending: Emergency Medicine | Admitting: Emergency Medicine

## 2013-05-07 ENCOUNTER — Encounter (HOSPITAL_COMMUNITY): Payer: Self-pay | Admitting: Emergency Medicine

## 2013-05-07 DIAGNOSIS — O9989 Other specified diseases and conditions complicating pregnancy, childbirth and the puerperium: Secondary | ICD-10-CM | POA: Insufficient documentation

## 2013-05-07 DIAGNOSIS — Z79899 Other long term (current) drug therapy: Secondary | ICD-10-CM | POA: Insufficient documentation

## 2013-05-07 DIAGNOSIS — O209 Hemorrhage in early pregnancy, unspecified: Secondary | ICD-10-CM | POA: Insufficient documentation

## 2013-05-07 DIAGNOSIS — R109 Unspecified abdominal pain: Secondary | ICD-10-CM | POA: Insufficient documentation

## 2013-05-07 DIAGNOSIS — N939 Abnormal uterine and vaginal bleeding, unspecified: Secondary | ICD-10-CM

## 2013-05-07 DIAGNOSIS — Z3490 Encounter for supervision of normal pregnancy, unspecified, unspecified trimester: Secondary | ICD-10-CM

## 2013-05-07 DIAGNOSIS — Z87891 Personal history of nicotine dependence: Secondary | ICD-10-CM | POA: Insufficient documentation

## 2013-05-07 MED ORDER — RHO D IMMUNE GLOBULIN 1500 UNIT/2ML IJ SOLN
300.0000 ug | Freq: Once | INTRAMUSCULAR | Status: AC
  Administered 2013-05-08: 300 ug via INTRAMUSCULAR

## 2013-05-07 NOTE — ED Notes (Signed)
PT IS [redacted] WKS PREGNANT AND STARTED CRAMPING AND BLEEDING WHICH IS BROWN IN COLOR AT AROUND 8:30PM. EDD 09-18-13

## 2013-05-07 NOTE — ED Provider Notes (Signed)
CSN: [id number]     Arrival date & time 05/07/13  2139 History  This chart was scribed for Joya Gaskins, MD by Ardelia Mems, ED Scribe. This patient was seen in room APA03/APA03 and the patient's care was started at 11:09 PM.  Chief Complaint  Patient presents with  . PREGNANT/BLEEDING     Patient is a 23 y.o. female presenting with vaginal bleeding. The history is provided by the patient. No language interpreter was used.  Vaginal Bleeding Quality: "brown, heavier than spotting" Severity:  Moderate Onset quality:  Sudden Duration:  2 hours Timing:  Constant Progression:  Unchanged Chronicity:  New Possible pregnancy: yes ([redacted] weeks pregnant)   Relieved by:  None tried Worsened by:  Nothing tried Ineffective treatments:  None tried Associated symptoms: abdominal pain   Associated symptoms: no back pain and no fever     HPI Comments: Kristina [name] is a 23 y.o. Female, who is [redacted] weeks pregnant, who presents to the Emergency Department complaining of vaginal bleeding described as "more than spotting" onset at 8:30 PM, about 2.5 hours ago. She states that the blood has been brown on color and that the bleeding has been continuous since onset. She states that she has had associated abdominal pain, described as "cramping". She states that she has not had any known complications with her pregnancy. She also reports that she has not felt any fetal activity/movements during her pregnancy. She denies fever, vomiting, coughing, back pain or any other symptoms.  No intercourse is reported No trauma/falls reported   [facility]   Past Medical History  Diagnosis Date  . Medical history non-contributory   . Pregnant    Past Surgical History  Procedure Laterality Date  . Tonsillectomy     Family History  Problem Relation Age of Onset  . Cancer Maternal Grandmother     breast  . Diabetes Paternal Grandfather    History  Substance Use Topics  . Smoking status:  Former Games developer  . Smokeless tobacco: Never Used  . Alcohol Use: No   OB History   Grav Para Term Preterm Abortions TAB SAB Ect Mult Living   2 1 1       1      Review of Systems  Constitutional: Negative for fever.  Respiratory: Negative for cough.   Gastrointestinal: Positive for abdominal pain. Negative for vomiting.  Genitourinary: Positive for vaginal bleeding.  Musculoskeletal: Negative for back pain.  All other systems reviewed and are negative.   Allergies  Review of patient's allergies indicates no known allergies.  Home Medications   Current Outpatient Rx  Name  Route  Sig  Dispense  Refill  . butalbital-acetaminophen-caffeine (FIORICET) 50-325-40 MG per tablet   Oral   Take 1 tablet by mouth every 6 (six) hours as needed for headache.   20 tablet   0   . Doxylamine-Pyridoxine (DICLEGIS) 10-10 MG TBEC   Oral   Take 2 tablets by mouth at bedtime.   60 tablet   3     May take one PO in the am and the afternoon prn na ...   . Prenatal Multivit-Min-Fe-FA (PRENATAL VITAMINS PO)   Oral   Take 1 tablet by mouth daily.          Triage Vitals: BP 133/74  Pulse 120  Temp(Src) 98.2 F (36.8 C) (Oral)  Resp 18  Ht 5\' 7"  (1.702 m)  Wt 211 lb (95.709 kg)  BMI 33.04 kg/m2  SpO2  100%  LMP 12/12/2012  Physical Exam  Nursing note and vitals reviewed. CONSTITUTIONAL: Well developed/well nourished HEAD: Normocephalic/atraumatic EYES: EOMI/PERRL ENMT: Mucous membranes moist NECK: supple no meningeal signs SPINE:entire spine nontender CV: S1/S2 noted, no murmurs/rubs/gallops noted LUNGS: Lungs are clear to auscultation bilaterally, no apparent distress ABDOMEN: soft, nontender, no rebound or guarding GU:no cva tenderness NEURO: Pt is awake/alert, moves all extremitiesx4 EXTREMITIES: pulses normal, full ROM SKIN: warm, color normal PSYCH: no abnormalities of mood noted  ED Course  Procedures  DIAGNOSTIC STUDIES: Oxygen Saturation is 100% on RA, normal by  my interpretation.    COORDINATION OF CARE: 11:14 PM- Will perform fetal monitoring. Pt advised of plan for treatment and pt agrees.  Pelvic exam- cervix closed.  Small amt of dark blood noted, no active bleeding noted, no fetal products noted.  Female Chaperone present and family present at patient request   Pt is 20 weeks by previous ultrasound D/w dr dove on call for OBGYN, since she is at 20 weeks, bleeding has slowed, cervical os closed, no contractions by monitoring, recommends rhogam (rh-) and pelvic rest and f/u with OB as outpatient and emergent transfer/monitoring is not warranted at this time.   FHT noted by doppler done by nursing   While awaiting rhogam in ED, pt did have another episode that resolved.  She denies contractions.  She is ambulatory, in no distress and she appears comfortable We discussed strict return precautions Stable for d/c home  Labs Review Labs Reviewed  RH IG WORKUP (INCLUDES ABO/RH)     MDM   Final diagnoses:  Pregnancy  Vaginal bleeding    Nursing notes including past medical history and social history reviewed and considered in documentation Labs/vital reviewed and considered Previous records reviewed and considered    I personally performed the services described in this documentation, which was scribed in my presence. The recorded information has been reviewed and is accurate.     Joya Gaskinsonald W Adeoluwa Silvers, MD 05/08/13 [phone]

## 2013-05-08 ENCOUNTER — Inpatient Hospital Stay (EMERGENCY_DEPARTMENT_HOSPITAL)
Admission: AD | Admit: 2013-05-08 | Discharge: 2013-05-08 | Disposition: A | Discharge status: Home / Self Care | Payer: Medicaid Other | Source: Ambulatory Visit | Attending: Obstetrics and Gynecology | Admitting: Obstetrics and Gynecology

## 2013-05-08 ENCOUNTER — Encounter (HOSPITAL_COMMUNITY): Payer: Self-pay

## 2013-05-08 ENCOUNTER — Encounter: Payer: Self-pay | Admitting: Women's Health

## 2013-05-08 DIAGNOSIS — O039 Complete or unspecified spontaneous abortion without complication: Secondary | ICD-10-CM | POA: Insufficient documentation

## 2013-05-08 HISTORY — DX: Unspecified blood type, rh negative: Z67.91

## 2013-05-08 HISTORY — DX: Other specified pregnancy related conditions, unspecified trimester: O26.899

## 2013-05-08 MED ORDER — IBUPROFEN 600 MG PO TABS: 600.0000 mg | ORAL_TABLET | Freq: Four times a day (QID) | ORAL | Status: DC | PRN

## 2013-05-08 MED ORDER — OXYCODONE-ACETAMINOPHEN 5-325 MG PO TABS
1.0000 | ORAL_TABLET | ORAL | Status: DC | PRN
  Administered 2013-05-08: 2 via ORAL
  Filled 2013-05-08: qty 2

## 2013-05-08 NOTE — MAU Provider Note (Signed)
History     CSN: [id number]  Arrival date and time: 05/08/13 0909   First Provider Initiated Contact with Patient 05/08/13 [phone]      Chief Complaint  Patient presents with  . 21 weeks-Delivered    HPI Kristina [name] is a 23 y.o. G45P1001 female @ [redacted]w[redacted]d by LMP which correlated exactly w/ 10.2wk u/s, who presents w/ report of delivering baby/placenta at home at 0800, she put it in a ziplock bag and brought it with her. She went to [facility] last night w/ report of cramping and brownish vb that began app 2.5hrs prior to her arrival there.  Pelvic exam there revealed closed cx, small amt dark blood, but no active bleeding, no uc's were noted on efm. Pt reported no fetal movement during pregnancy. EDP consulted w/ [facility] FP attending who recommended rhogam for rh-, pelvic rest, and f/u at [facility], and pt was d/c'd home. Pt reports nurse had hard time finding fhr, but finally found w/ doppler 'behind her femoral artery' and was 136-140.  Pt's documented hr upon arrival to ED was 120.  She states she continued to cramp during night and got really bad this am, she went to bathroom around 0800 and baby/intact membranes and placenta came out spontaneously together. Denies uti s/s, abnormal/malodorous vag d/c, vulvovaginal itching/irritation, recent illnesses.  She initiated pnc at [facility] @ 12.3wks and was seen again at 16.3wks w/ documented fhr of 160. She had a LSIL pap at initial new ob visit and was scheduled next visit for colpo. She also developed ha's that were not r/b [facility] and she was rx'd fioricet about 1.5wks ago. NT/IT neg, cx was long and closed on NT scan. She was scheduled for anatomy u/s last tues, but office was closed d/t snow, so she was rescheduled for this coming Monday. No other complications noted during pregnancy.                                                                                                                            OB History   Grav Para Term Preterm Abortions TAB SAB Ect Mult  Living   2 1 1       1       Past Medical History  Diagnosis Date  . Medical history non-contributory   . Pregnant   . Rh negative state in antepartum period     Past Surgical History  Procedure Laterality Date  . Tonsillectomy      Family History  Problem Relation Age of Onset  . Cancer Maternal Grandmother     breast  . Diabetes Paternal Grandfather     History  Substance Use Topics  . Smoking status: Former Games developer  . Smokeless tobacco: Never Used  . Alcohol Use: No    Allergies: No Known Allergies  Prescriptions prior to admission  Medication Sig Dispense Refill  . butalbital-acetaminophen-caffeine (FIORICET) 50-325-40 MG per tablet Take 1 tablet by mouth every 6 (six) hours as  needed for headache.  20 tablet  0    ROS Pertinent +/- as listed in HPI Physical Exam   Blood pressure 143/70, pulse 108, temperature 98.4 F (36.9 C), temperature source Oral, resp. rate 20, last menstrual period 12/12/2012, SpO2 100.00%.  Physical Exam  Constitutional: She is oriented to person, place, and time. She appears well-developed and well-nourished.  HENT:  Head: Normocephalic.  Cardiovascular: Normal rate and regular rhythm.   Respiratory: Effort normal and breath sounds normal.  GI: Soft.  FF u-2  Genitourinary:  Lochia normal  Musculoskeletal: Normal range of motion.  Neurological: She is alert and oriented to person, place, and time. She has normal reflexes.  Skin: Skin is warm and dry.  Psychiatric: She has a normal mood and affect. Her behavior is normal. Judgment and thought content normal.   Fetus w/in intact membranes, attached via cord to intact placenta. Membranes ruptured to evaluate fetus, brownish opaque fluid noted. No nuchal cord or knots noted. Fetus appears to be ~16wk size w/ brown skin coloration. Looks to be female: has labia majora w/ clitoral/or possible penile protrusion. No obvious external abnormalities. Cord clamped and cut. Co-exam of fetus  w/ Dr. Emelda FearFerguson.  MAU Course  Procedures PP eval Evaluation of fetus/placenta  Although [redacted]wk GA today, fetus likely passed around 16wks, shortly after 16.3wk appt. Dr. Emelda FearFerguson discussed options of hospital disposing of remains vs. funeral.  Pt decided for hospital disposal.  Placenta will be sent to pathology  Pt observed for ~4hrs from time of arrival, fundus firm, normal lochia Received percocet x 1 for report of cramps   Assessment and Plan  A:   4875w0d SAB at home of ~[redacted]wk GA fetus  Rh-, received rhogam last night at [facility]  Stable s/p 4hrs of observation P:  D/C home  Call [facility] Monday to schedule pp visit  Rx motrin  Placenta to path, fetus to morgue   Kristina DuncansBooker, Kristina [name] 05/08/2013, 10:06 AM

## 2013-05-08 NOTE — Discharge Instructions (Signed)
°  HOME CARE INSTRUCTIONS DO NOT USE TAMPONS. Do not douche or have sexual intercourse until approved by your caregiver.   If you are Rh negative and the father is Rh positive or you do not know the fathers blood type, you may receive a shot (Rh immune globulin) to help prevent abnormal antibodies that can develop and affect the baby in any future pregnancies. SEEK IMMEDIATE MEDICAL ATTENTION IF: You have severe cramps in your stomach, back, or abdomen.  You have a sudden onset of severe pain in the lower part of your abdomen.  You run an unexplained temperature of 101 F (38.3 C) or higher.  You pass large clots or tissue. Save any tissue for your caregiver to inspect.  Your bleeding increases or you become light-headed, weak, or have fainting episodes.

## 2013-05-08 NOTE — MAU Provider Note (Signed)
Attestation of Attending Supervision of Advanced Practitioner: Evaluation and management procedures were performed by the PA/NP/CNM/OB Fellow under my supervision/collaboration. Chart reviewed and agree with management and plan.  Tilda BurrowFERGUSON,Jani Ploeger V 05/08/2013 8:13 PM

## 2013-05-08 NOTE — MAU Note (Addendum)
Female fetus weighed 80 grams / 3 ounces and was 7 inches in length / 171/2 cms. Crown-rump length 13 cms.

## 2013-05-08 NOTE — Progress Notes (Signed)
AP RN unable to trace fetal heart tones. Suggested using doppler, or US to confirm fetal heart tones. No contractions seen.

## 2013-05-08 NOTE — MAU Note (Signed)
Small amount of rubra lochia noted. Patient tearful.

## 2013-05-08 NOTE — MAU Note (Signed)
Small amount of lochia rubra noted.

## 2013-05-08 NOTE — Progress Notes (Signed)
Requested central monitoring of Kristina [name] @ [facility]. She is a G2P1, 21wks [facility](EDC 7/11) complaining of vaginal bleeding.

## 2013-05-08 NOTE — Discharge Instructions (Signed)
Miscarriage A miscarriage is the sudden loss of an unborn baby (fetus) before the 20th week of pregnancy. Most miscarriages happen in the first 3 months of pregnancy. Sometimes, it happens before a woman even knows she is pregnant. A miscarriage is also called a "spontaneous miscarriage" or "early pregnancy loss." Having a miscarriage can be an emotional experience. Talk with your caregiver about any questions you may have about miscarrying, the grieving process, and your future pregnancy plans. CAUSES   Problems with the fetal chromosomes that make it impossible for the baby to develop normally. Problems with the baby's genes or chromosomes are most often the result of errors that occur, by chance, as the embryo divides and grows. The problems are not inherited from the parents.  Infection of the cervix or uterus.   Hormone problems.   Problems with the cervix, such as having an incompetent cervix. This is when the tissue in the cervix is not strong enough to hold the pregnancy.   Problems with the uterus, such as an abnormally shaped uterus, uterine fibroids, or congenital abnormalities.   Certain medical conditions.   Smoking, drinking alcohol, or taking illegal drugs.   Trauma.  Often, the cause of a miscarriage is unknown.  SYMPTOMS   Vaginal bleeding or spotting, with or without cramps or pain.  Pain or cramping in the abdomen or lower back.  Passing fluid, tissue, or blood clots from the vagina. DIAGNOSIS  Your caregiver will perform a physical exam. You may also have an ultrasound to confirm the miscarriage. Blood or urine tests may also be ordered. TREATMENT   Sometimes, treatment is not necessary if you naturally pass all the fetal tissue that was in the uterus. If some of the fetus or placenta remains in the body (incomplete miscarriage), tissue left behind may become infected and must be removed. Usually, a dilation and curettage (D and C) procedure is performed.  During a D and C procedure, the cervix is widened (dilated) and any remaining fetal or placental tissue is gently removed from the uterus.  Antibiotic medicines are prescribed if there is an infection. Other medicines may be given to reduce the size of the uterus (contract) if there is a lot of bleeding.  If you have Rh negative blood and your baby was Rh positive, you will need a Rh immunoglobulin shot. This shot will protect any future baby from having Rh blood problems in future pregnancies. HOME CARE INSTRUCTIONS   Your caregiver may order bed rest or may allow you to continue light activity. Resume activity as directed by your caregiver.  Have someone help with home and family responsibilities during this time.   Keep track of the number of sanitary pads you use each day and how soaked (saturated) they are. Write down this information.   Do not use tampons. Do not douche or have sexual intercourse until approved by your caregiver.   Only take over-the-counter or prescription medicines for pain or discomfort as directed by your caregiver.   Do not take aspirin. Aspirin can cause bleeding.   Keep all follow-up appointments with your caregiver.   If you or your partner have problems with grieving, talk to your caregiver or seek counseling to help cope with the pregnancy loss. Allow enough time to grieve before trying to get pregnant again.  SEEK IMMEDIATE MEDICAL CARE IF:   You have severe cramps or pain in your back or abdomen.  You have a fever.  You pass large blood clots (walnut-sized   or larger) ortissue from your vagina. Save any tissue for your caregiver to inspect.   Your bleeding increases.   You have a thick, bad-smelling vaginal discharge.  You become lightheaded, weak, or you faint.   You have chills.  MAKE SURE YOU:  Understand these instructions.  Will watch your condition.  Will get help right away if you are not doing well or get  worse. Document Released: 08/21/2000 Document Revised: 06/22/2012 Document Reviewed: 04/16/2011 ExitCare Patient Information 2014 [employer].  

## 2013-05-08 NOTE — MAU Note (Signed)
Pt states went to [facility] last pm, told cervix was closed, sent home. Came in to MAU with fetus/placenta. Delivered at home in toilet around 0800. Not cramping at present.

## 2013-05-10 ENCOUNTER — Ambulatory Visit: Payer: Medicaid Other | Admitting: Women's Health

## 2013-05-10 ENCOUNTER — Other Ambulatory Visit: Payer: Medicaid Other

## 2013-05-10 ENCOUNTER — Encounter: Payer: Medicaid Other | Admitting: Women's Health

## 2013-05-10 LAB — RH IG WORKUP (INCLUDES ABO/RH)
ABO/RH(D): A NEG
Antibody Screen: NEGATIVE
Fetal Screen: NEGATIVE
Gestational Age(Wks): 20
Unit division: 0

## 2013-05-12 ENCOUNTER — Encounter: Payer: Self-pay | Admitting: Women's Health

## 2013-05-12 ENCOUNTER — Other Ambulatory Visit: Payer: Medicaid Other

## 2013-05-12 ENCOUNTER — Ambulatory Visit (INDEPENDENT_AMBULATORY_CARE_PROVIDER_SITE_OTHER): Payer: Medicaid Other | Admitting: Women's Health

## 2013-05-12 VITALS — BP 130/80 | Ht 67.0 in | Wt 216.0 lb

## 2013-05-12 DIAGNOSIS — O039 Complete or unspecified spontaneous abortion without complication: Secondary | ICD-10-CM

## 2013-05-12 NOTE — Progress Notes (Signed)
Patient ID: Kristina [name], female   DOB: August 29, 1990, 23 y.o.   MRN: [id number]   [facility]  Patient name: Kristina [name] MRN [id number]  Date of birth: August 29, 1990  CC & HPI:  Kristina [name] is a 23 y.o. 422P1011 Caucasian female presenting today for f/u after SAB on Saturday. She was at 21.[redacted]wk GA, but delivered a 16wk size fetus that appeared to have been deceased for a few weeks.  She is teary, talking openly about circumstances of birth, grieving appropriately. Reports she has a lot of family support. Feels she is doing ok w/ her grieving process right now. Declines counseling. Reports normal cramping and lochia.  Did have a LSIL pap and needs colposcopy.   Pertinent History Reviewed:  Medical & Surgical Hx:   Past Medical History  Diagnosis Date  . Medical history non-contributory   . Pregnant   . Rh negative state in antepartum period    Past Surgical History  Procedure Laterality Date  . Tonsillectomy     Medications: Reviewed & Updated - see associated section Social History: Reviewed -  reports that she has quit smoking. She has never used smokeless tobacco.  Objective Findings:  Vitals: BP 130/80  Ht 5\' 7"  (1.702 m)  Wt 216 lb (97.977 kg)  BMI 33.82 kg/m2  LMP 12/12/2012  Breastfeeding? Unknown  Physical Examination: General appearance - oriented to person, place, and time and crying Pelvic: deferred, no complaints  Path report:   Received in formalin is an intact fetus and placenta. The fetus weighs 87 grams, and measures 13.2 cm crown to rump and 17.9 cm crown to heel. The foot length measures 2.0 cm, corresponding with an appropriate [redacted] week gestational age. All limbs and digits are present in the correct anatomical position. The eyes, nares and palliate are grossly unremarkable. The ears appear slightly lowered. A 3.7 cm in length umbilical cord is attached to the fetus. The fetus is tan gray with moderate skin slippage. The external and internal  genitalia are grossly female. The internal organs are present and in the appropriate positions. The brain is present with two hemispheres. Also received is a placenta, measuring 8.7 x 8.2 x 1.6 cm. The placenta weighs 72 grams. The umbilical cord is trivascular, tan pink and measures 12.5 cm in length x 0.5 cm in diameter. The membranes are marginally inserted, tan with an abundant amount of tan brown, focally hemorrhagic roughened tissue attached to the maternal surface. The fetal surface is tan pink, smooth, with a normal vascular pattern. The umbilical cord inserts 1.5 cm to the closest placental margin. The marginal surface is tan pink with ill defined cotyledons. The cut surface is tan pink and spongy, and no lesions are grossly identified. Assessment & Plan:  A:   F/U after SAB @ 21wks of 16wk size fetus    P:  Offered referral to counseling if needed for grieving process, declines at present time, will call back if she    changes her mind   Discussed contraception when she decides to become sexually active again, thinking about nuva ring- will    discuss further at next visit. Does eventually want to become pregnant again, but not anytime soon   F/U 7wks for colpo w/ JVF   Marge DuncansBooker, Shawndra Clute Randall CNM, [facility]NP-BC 05/12/2013 10:48 AM

## 2013-05-12 NOTE — Patient Instructions (Signed)
Ethinyl Estradiol; Etonogestrel vaginal ring- Nuva Ring What is this medicine? ETHINYL ESTRADIOL; ETONOGESTREL (ETH in il es tra DYE ole; et oh noe JES trel) vaginal ring is a flexible, vaginal ring used as a contraceptive (birth control method). This medicine combines two types of female hormones, an estrogen and a progestin. This ring is used to prevent ovulation and pregnancy. Each ring is effective for one month. This medicine may be used for other purposes; ask your health care provider or pharmacist if you have questions. COMMON BRAND NAME(S): NuvaRing What should I tell my health care provider before I take this medicine? They need to know if you have or ever had any of these conditions: -abnormal vaginal bleeding -blood vessel disease or blood clots -breast, cervical, endometrial, ovarian, liver, or uterine cancer -diabetes -gallbladder disease -heart disease or recent heart attack -high blood pressure -high cholesterol -kidney disease -liver disease -migraine headaches -stroke -systemic lupus erythematosus (SLE) -tobacco smoker -an unusual or allergic reaction to estrogens, progestins, other medicines, foods, dyes, or preservatives -pregnant or trying to get pregnant -breast-feeding How should I use this medicine? Insert the ring into your vagina as directed. Follow the directions on the prescription label. The ring will remain place for 3 weeks and is then removed for a 1-week break. A new ring is inserted 1 week after the last ring was removed, on the same day of the week. Do not use more often than directed. A patient package insert for the product will be given with each prescription and refill. Read this sheet carefully each time. The sheet may change frequently. Contact your pediatrician regarding the use of this medicine in children. Special care may be needed. This medicine has been used in female children who have started having menstrual periods. Overdosage: If you  think you have taken too much of this medicine contact a poison control center or emergency room at once. NOTE: This medicine is only for you. Do not share this medicine with others. What if I miss a dose? You will need to replace your vaginal ring once a month as directed. If the ring should slip out, or if you leave it in longer or shorter than you should, contact your health care professional for advice. What may interact with this medicine? -acetaminophen -antibiotics or medicines for infections, especially rifampin, rifabutin, rifapentine, and griseofulvin, and possibly penicillins or tetracyclines -aprepitant -ascorbic acid (vitamin C) -atorvastatin -barbiturate medicines, such as phenobarbital -bosentan -carbamazepine -caffeine -clofibrate -cyclosporine -dantrolene -doxercalciferol -felbamate -grapefruit juice -hydrocortisone -medicines for anxiety or sleeping problems, such as diazepam or temazepam -medicines for diabetes, including pioglitazone -modafinil -mycophenolate -nefazodone -oxcarbazepine -phenytoin -prednisolone -ritonavir or other medicines for HIV infection or AIDS -rosuvastatin -selegiline -soy isoflavones supplements -St. John's wort -tamoxifen or raloxifene -theophylline -thyroid hormones -topiramate -warfarin This list may not describe all possible interactions. Give your health care provider a list of all the medicines, herbs, non-prescription drugs, or dietary supplements you use. Also tell them if you smoke, drink alcohol, or use illegal drugs. Some items may interact with your medicine. What should I watch for while using this medicine? Visit your doctor or health care professional for regular checks on your progress. You will need a regular breast and pelvic exam and Pap smear while on this medicine. Use an additional method of contraception during the first cycle that you use this ring. If you have any reason to think you are pregnant, stop  using this medicine right away and contact your doctor or health care   professional. If you are using this medicine for hormone related problems, it may take several cycles of use to see improvement in your condition. Smoking increases the risk of getting a blood clot or having a stroke while you are using hormonal birth control, especially if you are more than 25 years old. You are strongly advised not to smoke. This medicine can make your body retain fluid, making your fingers, hands, or ankles swell. Your blood pressure can go up. Contact your doctor or health care professional if you feel you are retaining fluid. This medicine can make you more sensitive to the sun. Keep out of the sun. If you cannot avoid being in the sun, wear protective clothing and use sunscreen. Do not use sun lamps or tanning beds/booths. If you wear contact lenses and notice visual changes, or if the lenses begin to feel uncomfortable, consult your eye care specialist. In some women, tenderness, swelling, or minor bleeding of the gums may occur. Notify your dentist if this happens. Brushing and flossing your teeth regularly may help limit this. See your dentist regularly and inform your dentist of the medicines you are taking. If you are going to have elective surgery, you may need to stop using this medicine before the surgery. Consult your health care professional for advice. This medicine does not protect you against HIV infection (AIDS) or any other sexually transmitted diseases. What side effects may I notice from receiving this medicine? Side effects that you should report to your doctor or health care professional as soon as possible: -breast tissue changes or discharge -changes in vaginal bleeding during your period or between your periods -chest pain -coughing up blood -dizziness or fainting spells -headaches or migraines -leg, arm or groin pain -severe or sudden headaches -stomach pain (severe) -sudden  shortness of breath -sudden loss of coordination, especially on one side of the body -speech problems -symptoms of vaginal infection like itching, irritation or unusual discharge -tenderness in the upper abdomen -vomiting -weakness or numbness in the arms or legs, especially on one side of the body -yellowing of the eyes or skin Side effects that usually do not require medical attention (report to your doctor or health care professional if they continue or are bothersome): -breakthrough bleeding and spotting that continues beyond the 3 initial cycles of pills -breast tenderness -mood changes, anxiety, depression, frustration, anger, or emotional outbursts -increased sensitivity to sun or ultraviolet light -nausea -skin rash, acne, or brown spots on the skin -weight gain (slight) This list may not describe all possible side effects. Call your doctor for medical advice about side effects. You may report side effects to FDA at 1-800-FDA-1088. Where should I keep my medicine? Keep out of the reach of children. Store at room temperature between 15 and 30 degrees C (59 and 86 degrees F) for up to 4 months. The product will expire after 4 months. Protect from light. Throw away any unused medicine after the expiration date. NOTE: This sheet is a summary. It may not cover all possible information. If you have questions about this medicine, talk to your doctor, pharmacist, or health care provider.  2014, Elsevier/Gold Standard. (2008-02-11 12:03:58)

## 2013-05-20 ENCOUNTER — Inpatient Hospital Stay (HOSPITAL_COMMUNITY)
Admission: AD | Admit: 2013-05-20 | Discharge: 2013-05-21 | Disposition: A | Discharge status: Home / Self Care | Payer: Medicaid Other | Source: Ambulatory Visit | Attending: Obstetrics & Gynecology | Admitting: Obstetrics & Gynecology

## 2013-05-20 ENCOUNTER — Encounter (HOSPITAL_COMMUNITY): Payer: Self-pay | Admitting: *Deleted

## 2013-05-20 DIAGNOSIS — N854 Malposition of uterus: Secondary | ICD-10-CM | POA: Insufficient documentation

## 2013-05-20 DIAGNOSIS — O036 Delayed or excessive hemorrhage following complete or unspecified spontaneous abortion: Secondary | ICD-10-CM

## 2013-05-20 LAB — CBC
HCT: 37.3 % (ref 36.0–46.0)
HEMOGLOBIN: 12.2 g/dL (ref 12.0–15.0)
MCH: 27.7 pg (ref 26.0–34.0)
MCHC: 32.7 g/dL (ref 30.0–36.0)
MCV: 84.6 fL (ref 78.0–100.0)
PLATELETS: 250 10*3/uL (ref 150–400)
RBC: 4.41 MIL/uL (ref 3.87–5.11)
RDW: 13.8 % (ref 11.5–15.5)
WBC: 9.3 10*3/uL (ref 4.0–10.5)

## 2013-05-20 LAB — HCG, QUANTITATIVE, PREGNANCY: hCG, Beta Chain, Quant, S: 2 m[IU]/mL (ref <5)

## 2013-05-20 LAB — URINALYSIS, ROUTINE W REFLEX MICROSCOPIC
BILIRUBIN URINE: NEGATIVE
Glucose, UA: NEGATIVE mg/dL
Ketones, ur: NEGATIVE mg/dL
Leukocytes, UA: NEGATIVE
Nitrite: NEGATIVE
Protein, ur: 30 mg/dL — AB
UROBILINOGEN UA: 1 mg/dL (ref 0.0–1.0)
pH: 6 (ref 5.0–8.0)

## 2013-05-20 LAB — URINE MICROSCOPIC-ADD ON

## 2013-05-20 NOTE — MAU Provider Note (Signed)
Chief Complaint: No chief complaint on file.   First Provider Initiated Contact with Patient 05/21/13 0027      SUBJECTIVE HPI: Kristina [name] is a 23 y.o. G2P1011 12 day status post SAB of approximately 116 week-size fetus who presents with bleeding and cramping. States she has  had a small amount of intermittent brown bleeding since soon after delivery that increased and became dark red since yesterday. Bleeding is now as heavy as the beginning of her period. Also reports mild cramping 4/10 on pain scale. Denies fever, chills, passage of tissue, urinary complaints, GI complaints or abdominal pain.   Past Medical History  Diagnosis Date  . Medical history non-contributory   . Pregnant   . Rh negative state in antepartum period    OB History  Gravida Para Term Preterm AB SAB TAB Ectopic Multiple Living  2 1 1  1 1    1     # Outcome Date GA Lbr Len/2nd Weight Sex Delivery Anes PTL Lv  2 SAB 05/08/13 546w0d  0.085 kg (3 oz)   None       Comments: Delivered at home at 8550w0d, but fetus had been deceased for a few weeks/appeared to be 16-17wk size  1 TRM 12/09/07 832w6d  3.459 kg (7 lb 10 oz) F SVD EPI N Y     Past Surgical History  Procedure Laterality Date  . Tonsillectomy     History   Social History  . Marital Status: Single    Spouse Name: N/A    Number of Children: N/A  . Years of Education: N/A   Occupational History  . Not on file.   Social History Main Topics  . Smoking status: Former Games developermoker  . Smokeless tobacco: Never Used  . Alcohol Use: No  . Drug Use: No  . Sexual Activity: Yes    Birth Control/ Protection: None   Other Topics Concern  . Not on file   Social History Narrative  . No narrative on file   No current facility-administered medications on file prior to encounter.   Current Outpatient Prescriptions on File Prior to Encounter  Medication Sig Dispense Refill  . butalbital-acetaminophen-caffeine (FIORICET) 50-325-40 MG per tablet Take 1 tablet by mouth  every 6 (six) hours as needed for headache.  20 tablet  0   No Known Allergies  ROS: Pertinent items in HPI  OBJECTIVE Blood pressure 110/71, pulse 81, temperature 98 F (36.7 C), temperature source Oral, resp. rate 20, height 5\' 6"  (1.676 m), weight 96.843 kg (213 lb 8 oz), unknown if currently breastfeeding. GENERAL: Well-developed, well-nourished female in no acute distress.  HEENT: Normocephalic HEART: normal rate RESP: normal effort ABDOMEN: Soft, non-tender. Fundus not palpable. Positive bowel sounds. No CVA tenderness.  EXTREMITIES: Nontender, no edema NEURO: Alert and oriented SPECULUM EXAM: NEFG,  small amount of dark, red bleeding with small clots. No tissue seen, cervix clean BIMANUAL: cervix  closed; uterus  slightly enlarged , no adnexal tenderness or masses. No cervical motion tenderness.   LAB RESULTS Results for orders placed during the hospital encounter of 05/20/13 (from the past 24 hour(s))  URINALYSIS, ROUTINE W REFLEX MICROSCOPIC     Status: Abnormal   Collection Time    05/20/13  7:56 PM      Result Value Ref Range   Color, Urine RED (*) YELLOW   APPearance CLOUDY (*) CLEAR   Specific Gravity, Urine >1.030 (*) 1.005 - 1.030   pH 6.0  5.0 - 8.0  Glucose, UA NEGATIVE  NEGATIVE mg/dL   Hgb urine dipstick LARGE (*) NEGATIVE   Bilirubin Urine NEGATIVE  NEGATIVE   Ketones, ur NEGATIVE  NEGATIVE mg/dL   Protein, ur 30 (*) NEGATIVE mg/dL   Urobilinogen, UA 1.0  0.0 - 1.0 mg/dL   Nitrite NEGATIVE  NEGATIVE   Leukocytes, UA NEGATIVE  NEGATIVE  URINE MICROSCOPIC-ADD ON     Status: Abnormal   Collection Time    05/20/13  7:56 PM      Result Value Ref Range   Squamous Epithelial / LPF RARE  RARE   WBC, UA 0-2  <3 WBC/hpf   RBC / HPF TOO NUMEROUS TO COUNT  <3 RBC/hpf   Bacteria, UA FEW (*) RARE   Urine-Other MUCOUS PRESENT    HCG, QUANTITATIVE, PREGNANCY     Status: None   Collection Time    05/20/13  9:12 PM      Result Value Ref Range   hCG, Beta Chain,  Quant, S 2  <5 mIU/mL  CBC     Status: None   Collection Time    05/20/13 10:12 PM      Result Value Ref Range   WBC 9.3  4.0 - 10.5 K/uL   RBC 4.41  3.87 - 5.11 MIL/uL   Hemoglobin 12.2  12.0 - 15.0 g/dL   HCT 45.4  09.8 - 11.9 %   MCV 84.6  78.0 - 100.0 fL   MCH 27.7  26.0 - 34.0 pg   MCHC 32.7  30.0 - 36.0 g/dL   RDW 14.7  82.9 - 56.2 %   Platelets 250  150 - 400 K/uL   Suspect contaminated urine specimen. Will culture.  IMAGING US Transvaginal Non-ob  05/21/2013   CLINICAL DATA:  Bleeding, 12 days status post second trimester spontaneous abortion.  EXAM: TRANSABDOMINAL AND TRANSVAGINAL ULTRASOUND OF PELVIS  TECHNIQUE: Both transabdominal and transvaginal ultrasound examinations of the pelvis were performed. Transabdominal technique was performed for global imaging of the pelvis including uterus, ovaries, adnexal regions, and pelvic cul-de-sac. It was necessary to proceed with endovaginal exam following the transabdominal exam to visualize the endometrium and adnexa.  COMPARISON:  None  FINDINGS: Uterus  Measurements: Retroverted, measuring 9.4 x 5.4 x 8.0 cm. No fibroids or other mass visualized.  Endometrium  Thickness: Thickened and heterogeneous, measuring 13 mm. Increased vascularity.  Right ovary  Measurements: 5.0 x 2.5 x 3.3 cm. Normal appearance/no adnexal mass.  Left ovary  Measurements: 5.7 x 4.2 x 3.7 cm. Anechoic/physiologic cyst measuring 4.5 x 3.3 x 2.9 cm  Other findings  Trace free fluid  IMPRESSION: Thickened endometrium with increased vascularity. Retained products of conception not excluded.   Electronically Signed   By: Jearld Lesch M.D.   On: 05/21/2013 01:46   US Pelvis Complete  05/21/2013   CLINICAL DATA:  Bleeding, 12 days status post second trimester spontaneous abortion.  EXAM: TRANSABDOMINAL AND TRANSVAGINAL ULTRASOUND OF PELVIS  TECHNIQUE: Both transabdominal and transvaginal ultrasound examinations of the pelvis were performed. Transabdominal technique was  performed for global imaging of the pelvis including uterus, ovaries, adnexal regions, and pelvic cul-de-sac. It was necessary to proceed with endovaginal exam following the transabdominal exam to visualize the endometrium and adnexa.  COMPARISON:  None  FINDINGS: Uterus  Measurements: Retroverted, measuring 9.4 x 5.4 x 8.0 cm. No fibroids or other mass visualized.  Endometrium  Thickness: Thickened and heterogeneous, measuring 13 mm. Increased vascularity.  Right ovary  Measurements: 5.0 x 2.5 x 3.3 cm.  Normal appearance/no adnexal mass.  Left ovary  Measurements: 5.7 x 4.2 x 3.7 cm. Anechoic/physiologic cyst measuring 4.5 x 3.3 x 2.9 cm  Other findings  Trace free fluid  IMPRESSION: Thickened endometrium with increased vascularity. Retained products of conception not excluded.   Electronically Signed   By: Jearld Lesch M.D.   On: 05/21/2013 01:46   MAU COURSE  ASSESSMENT 1. Spontaneous abortion complicated by delayed or excessive hemorrhage   PLAN Discharge home in stable condition per consult with Dr.Anyanwu. Bleeding and infection precautions. Urine culture pending.     Follow-up Information   Follow up with [facility]. (As needed if no improvement in 2-3 days or if symptoms worsen)    Contact information:   [address] [place] [address] [phone]      Follow up with [facility]. (As needed in emergencies)    Contact information:   [address] [id number] [place] [address] [phone]       Medication List         butalbital-acetaminophen-caffeine 50-325-40 MG per tablet  Commonly known as:  FIORICET  Take 1 tablet by mouth every 6 (six) hours as needed for headache.     flintstones complete 60 MG chewable tablet  Chew 2 tablets by mouth daily.     ibuprofen 600 MG tablet  Commonly known as:  ADVIL,MOTRIN  Take 1 tablet (600 mg total) by mouth every 6 (six) hours as needed for mild  pain, moderate pain or cramping.     methylergonovine 0.2 MG tablet  Commonly known as:  METHERGINE  Take 1 tablet (0.2 mg total) by mouth 4 (four) times daily.       [place], CNM 05/21/2013  2:47 AM

## 2013-05-20 NOTE — MAU Note (Signed)
PT SAYS SHE HAD SAB ON 2-28 AND  HAD BLEEDING  THEN IT STOPPED-  TODAY BLEEDING INCREASED SOME- RED. Marland Kitchen.   CRAMPING STARTED TODAY-  LESS NOW-  NO MEDS FOR PAIN.   ON PAD IN TRIAGE - SMALL AMT DARK RED.     SAW  KIM Grace BushyBOOKER IN OFFICE-   LAST WEEK .

## 2013-05-21 ENCOUNTER — Inpatient Hospital Stay (HOSPITAL_COMMUNITY): Payer: Medicaid Other

## 2013-05-21 DIAGNOSIS — O036 Delayed or excessive hemorrhage following complete or unspecified spontaneous abortion: Secondary | ICD-10-CM

## 2013-05-21 MED ORDER — METHYLERGONOVINE MALEATE 0.2 MG PO TABS
0.2000 mg | ORAL_TABLET | Freq: Once | ORAL | Status: AC
  Administered 2013-05-21: 0.2 mg via ORAL
  Filled 2013-05-21: qty 1

## 2013-05-21 MED ORDER — METHYLERGONOVINE MALEATE 0.2 MG PO TABS: 0.2000 mg | ORAL_TABLET | Freq: Four times a day (QID) | ORAL | Status: DC

## 2013-05-21 MED ORDER — KETOROLAC TROMETHAMINE 60 MG/2ML IM SOLN
60.0000 mg | Freq: Once | INTRAMUSCULAR | Status: AC | PRN
  Administered 2013-05-21: 60 mg via INTRAMUSCULAR
  Filled 2013-05-21: qty 2

## 2013-05-21 MED ORDER — IBUPROFEN 600 MG PO TABS: 600.0000 mg | ORAL_TABLET | Freq: Four times a day (QID) | ORAL | Status: DC | PRN

## 2013-05-21 MED ORDER — IBUPROFEN 600 MG PO TABS
600.0000 mg | ORAL_TABLET | Freq: Once | ORAL | Status: DC
  Filled 2013-05-21: qty 1

## 2013-05-21 NOTE — Discharge Instructions (Signed)
Incomplete Miscarriage A miscarriage is the sudden loss of an unborn baby (fetus) before the 20th week of pregnancy. In an incomplete miscarriage, parts of the fetus or placenta (afterbirth) remain in the body.  Having a miscarriage can be an emotional experience. Talk with your health care provider about any questions you may have about miscarrying, the grieving process, and your future pregnancy plans. CAUSES   Problems with the fetal chromosomes that make it impossible for the baby to develop normally. Problems with the baby's genes or chromosomes are most often the result of errors that occur by chance as the embryo divides and grows. The problems are not inherited from the parents.  Infection of the cervix or uterus.  Hormone problems.  Problems with the cervix, such as having an incompetent cervix. This is when the tissue in the cervix is not strong enough to hold the pregnancy.  Problems with the uterus, such as an abnormally shaped uterus, uterine fibroids, or congenital abnormalities.  Certain medical conditions.  Smoking, drinking alcohol, or taking illegal drugs.  Trauma. SYMPTOMS   Vaginal bleeding or spotting, with or without cramps or pain.  Pain or cramping in the abdomen or lower back.  Passing fluid, tissue, or blood clots from the vagina. DIAGNOSIS  Your health care provider will perform a physical exam. You may also have an ultrasound to confirm the miscarriage. Blood or urine tests may also be ordered. TREATMENT   You may be prescribed medicine to help pass the remaining tissue.  A dilation and curettage (D&C) procedure may performed. During a D&C procedure, the cervix is widened (dilated) and any remaining fetal or placental tissue is gently removed from the uterus.  Antibiotic medicines are prescribed if there is an infection. Other medicines may be given to reduce the size of the uterus (contract) if there is a lot of bleeding.  If you have Rh negative  blood and your baby was Rh positive, you will need an Rho(D) immune globulin shot. This shot will protect any future baby from having Rh blood problems in future pregnancies.  You may be confined to bed rest. This means you should stay in bed and only get up to use the bathroom. HOME CARE INSTRUCTIONS   Rest as directed by your health care provider.  Restrict activity as directed by your health care provider. You may be allowed to continue light activity if curettage was not done but you require further treatment.  Keep track of the number of pads you use each day. Keep track of how soaked (saturated) they are. Record this information.  Do not  use tampons.  Do not douche or have sexual intercourse until approved by your health care provider.  Keep all follow-up appointments for re-evaluation and continuing management.  Only take over-the-counter or prescription medicines for pain, fever, or discomfort as directed by your health care provider.  Take antibiotic medicine as directed by your health care provider. Make sure you finish it even if you start to feel better. SEEK IMMEDIATE MEDICAL CARE IF:   You experience severe cramps in your stomach, back, or abdomen.  You have an unexplained temperature (make sure to record these temperatures).  You pass large clots or tissue (save these for your health care provider to inspect).  Your bleeding increases.  You become light-headed, weak, or have fainting episodes. MAKE SURE YOU:   Understand these instructions.  Will watch your condition.  Will get help right away if you are not doing well or  get worse. Document Released: 02/25/2005 Document Revised: 12/16/2012 Document Reviewed: 09/24/2012 [facility] Patient Information 2014 [place], [place].

## 2013-05-21 NOTE — MAU Provider Note (Signed)
Attestation of Attending Supervision of Advanced Practitioner (PA/CNM/NP): Evaluation and management procedures were performed by the Advanced Practitioner under my supervision and collaboration.  I have reviewed the Advanced Practitioner's note and chart, and I agree with the management and plan.  Foday Cone, MD, FACOG Attending Obstetrician & Gynecologist Faculty Practice, [facility]  

## 2013-05-21 NOTE — MAU Note (Signed)
PT HAS VERY SMALL AMT  DARK RED VAG BLEEDING-  STRINGY.

## 2013-05-22 LAB — URINE CULTURE: Colony Count: 2000

## 2013-06-07 ENCOUNTER — Telehealth: Payer: Self-pay | Admitting: Obstetrics and Gynecology

## 2013-06-07 NOTE — Telephone Encounter (Signed)
[facility]. Aram BeechamCynthia states spoke with pt by phone today and pt states not depressed but feeling sad about SAB. Aram BeechamCynthia is to follow up with pt but requesting our office to do postpartum depression evaluation at her 06/30/2013 appt, also states will call our office back if she feels pt needs to be seen sooner.

## 2013-06-30 ENCOUNTER — Ambulatory Visit (INDEPENDENT_AMBULATORY_CARE_PROVIDER_SITE_OTHER): Payer: Medicaid Other | Admitting: Obstetrics and Gynecology

## 2013-06-30 ENCOUNTER — Encounter: Payer: Self-pay | Admitting: Obstetrics and Gynecology

## 2013-06-30 ENCOUNTER — Other Ambulatory Visit: Payer: Self-pay | Admitting: Obstetrics and Gynecology

## 2013-06-30 VITALS — BP 120/82 | Ht 67.0 in | Wt 222.0 lb

## 2013-06-30 DIAGNOSIS — N870 Mild cervical dysplasia: Secondary | ICD-10-CM

## 2013-06-30 DIAGNOSIS — R87612 Low grade squamous intraepithelial lesion on cytologic smear of cervix (LGSIL): Secondary | ICD-10-CM

## 2013-06-30 DIAGNOSIS — Z3200 Encounter for pregnancy test, result unknown: Secondary | ICD-10-CM

## 2013-06-30 DIAGNOSIS — Z3202 Encounter for pregnancy test, result negative: Secondary | ICD-10-CM

## 2013-06-30 LAB — POCT URINE PREGNANCY: Preg Test, Ur: NEGATIVE

## 2013-06-30 NOTE — Patient Instructions (Signed)
COLPOSCOPY POST-PROCEDURE INSTRUCTIONS You may take Ibuprofen, Aleve or Tylenol for cramping if needed. If Monsel's solution was used, you will have a black discharge Light bleeding is normal.  If bleeding is heavier than your period, please call. Put nothing in your vagina until the bleeding or discharge stops (usually 2 or3 days). We will call you within one week with biopsy results or discuss the results at your follow-up appointment if needed.  Colposcopy Care After Colposcopy is a procedure in which a special tool is used to magnify the surface of the cervix. A tissue sample (biopsy) may also be taken. This sample will be looked at for cervical cancer or other problems. After the test:  You may have some cramping.  Lie down for a few minutes if you feel lightheaded.   You may have some bleeding which should stop in a few days. HOME CARE  Do not have sex or use tampons for 2 to 3 days or as told.  Only take medicine as told by your doctor.  Continue to take your birth control pills as usual. Finding out the results of your test Ask when your test results will be ready. Make sure you get your test results. GET HELP RIGHT AWAY IF:  You are bleeding a lot or are passing blood clots.  You develop a fever of 102 F (38.9 C) or higher.  You have abnormal vaginal discharge.  You have cramps that do not go away with medicine.  You feel lightheaded, dizzy, or pass out (faint). MAKE SURE YOU:   Understand these instructions.  Will watch your condition.  Will get help right away if you are not doing well or get worse. Document Released: 08/14/2007 Document Revised: 05/20/2011 Document Reviewed: 09/24/2012 [facility] Patient Information 2014 [place], [place].

## 2013-06-30 NOTE — Progress Notes (Signed)
This chart was scribed by Bennett Scrapehristina Taylor, Medical Scribe, for Dr. Christin BachJohn Cherokee Boccio on 06/30/13 at 2:34 PM. This chart was reviewed by Dr. Christin BachJohn Garnett Rekowski and is accurate.   Patient ID: Kristina HiddenJanie K [name], female   DOB: [birth year], 23 y.o.   MRN: [id number]  Kristina HiddenJanie K [name] 23 y.o. [id number] here for colposcopy for low-grade squamous intraepithelial neoplasia (LGSIL - encompassing HPV,mild dysplasia,CIN I) pap smear on 03/09/13. Been with sexual partner for the past year. Discussed role for HPV in cervical dysplasia, need for surveillance.  Patient given informed consent, signed copy in the chart, time out was performed.  Placed in lithotomy position. Cervix viewed with speculum and colposcope after application of acetic acid.   Colposcopy adequate? Yes  visible lesion(s) at 6 o'clock and 3 o'clock, acetowhite; biopsies obtained at 6 o'clock and anterior lip  ECC specimen obtained. All specimens were labelled and sent to pathology.  Colposcopy IMPRESSION: CIN I  Patient was given post procedure instructions. Will follow up pathology and manage accordingly.  Routine preventative health maintenance measures emphasized.

## 2013-07-16 ENCOUNTER — Telehealth: Payer: Self-pay | Admitting: *Deleted

## 2013-07-16 NOTE — Telephone Encounter (Signed)
Pt informed of abnormal cervical biopsy (CIN I) will f/u with pap and HPV in 1 year per Dr. Emelda FearFerguson. Pt verbalized understanding.

## 2013-07-16 NOTE — Telephone Encounter (Signed)
Message copied by Criss AlvinePULLIAM, Ebonique Hallstrom G on Fri Jul 16, 2013  8:57 AM ------      Message from: Tilda BurrowFERGUSON, JOHN V      Created: Fri Jul 09, 2013  5:07 PM       Cin -I as discussed at time of colposcopy. Advise pap with HPV testing in 1 year. ------

## 2013-10-28 ENCOUNTER — Other Ambulatory Visit: Payer: Self-pay | Admitting: Obstetrics and Gynecology

## 2013-10-28 DIAGNOSIS — O3680X0 Pregnancy with inconclusive fetal viability, not applicable or unspecified: Secondary | ICD-10-CM

## 2013-11-01 ENCOUNTER — Ambulatory Visit (INDEPENDENT_AMBULATORY_CARE_PROVIDER_SITE_OTHER): Payer: Medicaid Other

## 2013-11-01 ENCOUNTER — Other Ambulatory Visit: Payer: Medicaid Other

## 2013-11-01 DIAGNOSIS — O3680X0 Pregnancy with inconclusive fetal viability, not applicable or unspecified: Secondary | ICD-10-CM

## 2013-11-01 NOTE — Progress Notes (Signed)
U/S(7+4wks)-single IUP with +FCA noted FHR- 158 bpm, cx appears closed, bilateral adnexa appears WNL with C.L. Noted on LT, no free fluid noted

## 2013-11-09 ENCOUNTER — Encounter: Payer: Self-pay | Admitting: Women's Health

## 2013-11-09 ENCOUNTER — Ambulatory Visit (INDEPENDENT_AMBULATORY_CARE_PROVIDER_SITE_OTHER): Payer: Medicaid Other | Admitting: Women's Health

## 2013-11-09 VITALS — BP 124/70 | Wt 238.0 lb

## 2013-11-09 DIAGNOSIS — Z3480 Encounter for supervision of other normal pregnancy, unspecified trimester: Secondary | ICD-10-CM | POA: Insufficient documentation

## 2013-11-09 DIAGNOSIS — Z1159 Encounter for screening for other viral diseases: Secondary | ICD-10-CM

## 2013-11-09 DIAGNOSIS — Z0189 Encounter for other specified special examinations: Secondary | ICD-10-CM

## 2013-11-09 DIAGNOSIS — O26899 Other specified pregnancy related conditions, unspecified trimester: Secondary | ICD-10-CM

## 2013-11-09 DIAGNOSIS — Z0184 Encounter for antibody response examination: Secondary | ICD-10-CM

## 2013-11-09 DIAGNOSIS — Z1389 Encounter for screening for other disorder: Secondary | ICD-10-CM

## 2013-11-09 DIAGNOSIS — Z36 Encounter for antenatal screening of mother: Secondary | ICD-10-CM

## 2013-11-09 DIAGNOSIS — R87612 Low grade squamous intraepithelial lesion on cytologic smear of cervix (LGSIL): Secondary | ICD-10-CM

## 2013-11-09 DIAGNOSIS — Z3481 Encounter for supervision of other normal pregnancy, first trimester: Secondary | ICD-10-CM

## 2013-11-09 DIAGNOSIS — Z6791 Unspecified blood type, Rh negative: Secondary | ICD-10-CM | POA: Insufficient documentation

## 2013-11-09 DIAGNOSIS — Z331 Pregnant state, incidental: Secondary | ICD-10-CM

## 2013-11-09 DIAGNOSIS — Z1371 Encounter for nonprocreative screening for genetic disease carrier status: Secondary | ICD-10-CM

## 2013-11-09 LAB — POCT URINALYSIS DIPSTICK
Blood, UA: NEGATIVE
GLUCOSE UA: NEGATIVE
Ketones, UA: NEGATIVE
Leukocytes, UA: NEGATIVE
Nitrite, UA: NEGATIVE
Protein, UA: NEGATIVE

## 2013-11-09 LAB — CBC
HEMATOCRIT: 37.7 % (ref 36.0–46.0)
HEMOGLOBIN: 12.4 g/dL (ref 12.0–15.0)
MCH: 25.7 pg — ABNORMAL LOW (ref 26.0–34.0)
MCHC: 32.9 g/dL (ref 30.0–36.0)
MCV: 78.1 fL (ref 78.0–100.0)
Platelets: 300 10*3/uL (ref 150–400)
RBC: 4.83 MIL/uL (ref 3.87–5.11)
RDW: 16.1 % — ABNORMAL HIGH (ref 11.5–15.5)
WBC: 8.2 10*3/uL (ref 4.0–10.5)

## 2013-11-09 NOTE — Patient Instructions (Signed)
First Trimester of Pregnancy The first trimester of pregnancy is from week 1 until the end of week 12 (months 1 through 3). A week after a sperm fertilizes an egg, the egg will implant on the wall of the uterus. This embryo will begin to develop into a baby. Genes from you and your partner are forming the baby. The female genes determine whether the baby is a boy or a girl. At 6-8 weeks, the eyes and face are formed, and the heartbeat can be seen on ultrasound. At the end of 12 weeks, all the baby's organs are formed.  Now that you are pregnant, you will want to do everything you can to have a healthy baby. Two of the most important things are to get good prenatal care and to follow your health care provider's instructions. Prenatal care is all the medical care you receive before the baby's birth. This care will help prevent, find, and treat any problems during the pregnancy and childbirth. BODY CHANGES Your body goes through many changes during pregnancy. The changes vary from woman to woman.   You may gain or lose a couple of pounds at first.  You may feel sick to your stomach (nauseous) and throw up (vomit). If the vomiting is uncontrollable, call your health care provider.  You may tire easily.  You may develop headaches that can be relieved by medicines approved by your health care provider.  You may urinate more often. Painful urination may mean you have a bladder infection.  You may develop heartburn as a result of your pregnancy.  You may develop constipation because certain hormones are causing the muscles that push waste through your intestines to slow down.  You may develop hemorrhoids or swollen, bulging veins (varicose veins).  Your breasts may begin to grow larger and become tender. Your nipples may stick out more, and the tissue that surrounds them (areola) may become darker.  Your gums may bleed and may be sensitive to brushing and flossing.  Dark spots or blotches (chloasma,  mask of pregnancy) may develop on your face. This will likely fade after the baby is born.  Your menstrual periods will stop.  You may have a loss of appetite.  You may develop cravings for certain kinds of food.  You may have changes in your emotions from day to day, such as being excited to be pregnant or being concerned that something may go wrong with the pregnancy and baby.  You may have more vivid and strange dreams.  You may have changes in your hair. These can include thickening of your hair, rapid growth, and changes in texture. Some women also have hair loss during or after pregnancy, or hair that feels dry or thin. Your hair will most likely return to normal after your baby is born. WHAT TO EXPECT AT YOUR PRENATAL VISITS During a routine prenatal visit:  You will be weighed to make sure you and the baby are growing normally.  Your blood pressure will be taken.  Your abdomen will be measured to track your baby's growth.  The fetal heartbeat will be listened to starting around week 10 or 12 of your pregnancy.  Test results from any previous visits will be discussed. Your health care provider may ask you:  How you are feeling.  If you are feeling the baby move.  If you have had any abnormal symptoms, such as leaking fluid, bleeding, severe headaches, or abdominal cramping.  If you have any questions. Other tests   that may be performed during your first trimester include:  Blood tests to find your blood type and to check for the presence of any previous infections. They will also be used to check for low iron levels (anemia) and Rh antibodies. Later in the pregnancy, blood tests for diabetes will be done along with other tests if problems develop.  Urine tests to check for infections, diabetes, or protein in the urine.  An ultrasound to confirm the proper growth and development of the baby.  An amniocentesis to check for possible genetic problems.  Fetal screens for  spina bifida and Down syndrome.  You may need other tests to make sure you and the baby are doing well. HOME CARE INSTRUCTIONS  Medicines  Follow your health care provider's instructions regarding medicine use. Specific medicines may be either safe or unsafe to take during pregnancy.  Take your prenatal vitamins as directed.  If you develop constipation, try taking a stool softener if your health care provider approves. Diet  Eat regular, well-balanced meals. Choose a variety of foods, such as meat or vegetable-based protein, fish, milk and low-fat dairy products, vegetables, fruits, and whole grain breads and cereals. Your health care provider will help you determine the amount of weight gain that is right for you.  Avoid raw meat and uncooked cheese. These carry germs that can cause birth defects in the baby.  Eating four or five small meals rather than three large meals a day may help relieve nausea and vomiting. If you start to feel nauseous, eating a few soda crackers can be helpful. Drinking liquids between meals instead of during meals also seems to help nausea and vomiting.  If you develop constipation, eat more high-fiber foods, such as fresh vegetables or fruit and whole grains. Drink enough fluids to keep your urine clear or pale yellow. Activity and Exercise  Exercise only as directed by your health care provider. Exercising will help you:  Control your weight.  Stay in shape.  Be prepared for labor and delivery.  Experiencing pain or cramping in the lower abdomen or low back is a good sign that you should stop exercising. Check with your health care provider before continuing normal exercises.  Try to avoid standing for long periods of time. Move your legs often if you must stand in one place for a long time.  Avoid heavy lifting.  Wear low-heeled shoes, and practice good posture.  You may continue to have sex unless your health care provider directs you  otherwise. Relief of Pain or Discomfort  Wear a good support bra for breast tenderness.   Take warm sitz baths to soothe any pain or discomfort caused by hemorrhoids. Use hemorrhoid cream if your health care provider approves.   Rest with your legs elevated if you have leg cramps or low back pain.  If you develop varicose veins in your legs, wear support hose. Elevate your feet for 15 minutes, 3-4 times a day. Limit salt in your diet. Prenatal Care  Schedule your prenatal visits by the twelfth week of pregnancy. They are usually scheduled monthly at first, then more often in the last 2 months before delivery.  Write down your questions. Take them to your prenatal visits.  Keep all your prenatal visits as directed by your health care provider. Safety  Wear your seat belt at all times when driving.  Make a list of emergency phone numbers, including numbers for family, friends, the hospital, and police and fire departments. General Tips    Ask your health care provider for a referral to a local prenatal education class. Begin classes no later than at the beginning of month 6 of your pregnancy.  Ask for help if you have counseling or nutritional needs during pregnancy. Your health care provider can offer advice or refer you to specialists for help with various needs.  Do not use hot tubs, steam rooms, or saunas.  Do not douche or use tampons or scented sanitary pads.  Do not cross your legs for long periods of time.  Avoid cat litter boxes and soil used by cats. These carry germs that can cause birth defects in the baby and possibly loss of the fetus by miscarriage or stillbirth.  Avoid all smoking, herbs, alcohol, and medicines not prescribed by your health care provider. Chemicals in these affect the formation and growth of the baby.  Schedule a dentist appointment. At home, brush your teeth with a soft toothbrush and be gentle when you floss. SEEK MEDICAL CARE IF:   You have  dizziness.  You have mild pelvic cramps, pelvic pressure, or nagging pain in the abdominal area.  You have persistent nausea, vomiting, or diarrhea.  You have a bad smelling vaginal discharge.  You have pain with urination.  You notice increased swelling in your face, hands, legs, or ankles. SEEK IMMEDIATE MEDICAL CARE IF:   You have a fever.  You are leaking fluid from your vagina.  You have spotting or bleeding from your vagina.  You have severe abdominal cramping or pain.  You have rapid weight gain or loss.  You vomit blood or material that looks like coffee grounds.  You are exposed to German measles and have never had them.  You are exposed to fifth disease or chickenpox.  You develop a severe headache.  You have shortness of breath.  You have any kind of trauma, such as from a fall or a car accident. Document Released: 02/19/2001 Document Revised: 07/12/2013 Document Reviewed: 01/05/2013 [employer] Patient Information 2015 [employer]. This information is not intended to replace advice given to you by your health care provider. Make sure you discuss any questions you have with your health care provider.  

## 2013-11-09 NOTE — Progress Notes (Signed)
  Subjective:  Kristina [name] is a 23 y.o. G65P1011 Caucasian female at [redacted]w[redacted]d by LMP c/w 7wk u/s, being seen today for her first obstetrical visit.  Her obstetrical history is significant for term uncomplicated SVD in 2009, then delivery at 21.0wks of 16-17wk size fetus 05/08/13- thought to be early second trimester loss- not stillbirth or cervical incompetence.  Pregnancy history fully reviewed. Getting married 08/06/14!  Patient reports no complaints. Denies vb, cramping, uti s/s, abnormal/malodorous vag d/c, or vulvovaginal itching/irritation.  BP 124/70  Wt 238 lb (107.956 kg)  LMP 09/09/2013  HISTORY: OB History  Gravida Para Term Preterm AB SAB TAB Ectopic Multiple Living  # Outcome Date GA Lbr Len/2nd Weight Sex Delivery Anes PTL Lv  3 CUR           2 SAB 05/08/13 [redacted]w[redacted]d  3 oz (0.085 kg)   None       Comments: Delivered at home at [redacted]w[redacted]d, but fetus had been deceased for a few weeks/appeared to be 16-17wk size  1 TRM 12/09/07 [redacted]w[redacted]d  7 lb 10 oz (3.459 kg) F SVD EPI N Y     Past Medical History  Diagnosis Date  . Medical history non-contributory   . Pregnant   . Rh negative state in antepartum period   . Vaginal Pap smear, abnormal    Past Surgical History  Procedure Laterality Date  . Tonsillectomy     Family History  Problem Relation Age of Onset  . Cancer Maternal Grandmother     breast  . Diabetes Paternal Grandfather     Exam   System:     General: Well developed & nourished, no acute distress   Skin: Warm & dry, normal coloration and turgor, no rashes   Neurologic: Alert & oriented, normal mood   Cardiovascular: Regular rate & rhythm   Respiratory: Effort & rate normal, LCTAB, acyanotic   Abdomen: Soft, non tender   Extremities: normal strength, tone   Pelvic Exam:    Perineum: Normal perineum   Vulva: Normal, no lesions   Vagina:  Normal mucosa, normal discharge   Cervix: Normal, bulbous, appears closed   Uterus: Normal size/shape/contour  for GA   Thin prep pap smear- needs after 03/09/14 (LSIL/HPV w/ colpo & bx confirming CIN-I 03/09/13) FHR: 174 via informal transabdominal u/s   Assessment:   Pregnancy: G3P1011 Patient Active Problem List   Diagnosis Date Noted  . Supervision of other normal pregnancy 11/09/2013    Priority: High  . SAB (spontaneous abortion) 05/08/2013    Priority: High  . Abnormal Pap smear of cervix, LSIL 04/06/2013    Priority: High    [redacted]w[redacted]d G3P1011 New OB visit 21wk delivery of 16-17wk size fetus in Feb 2015- thought to be early 2nd trimester loss, not stillbirth and not r/t cervical insufficiency    Plan:  Initial labs drawn Continue prenatal vitamins Problem list reviewed and updated Reviewed n/v relief measures and warning s/s to report Reviewed recommended weight gain based on pre-gravid BMI Encouraged well-balanced diet Genetic Screening discussed Integrated Screen: requested Cystic fibrosis screening discussed requested Ultrasound discussed; fetal survey: requested Follow up in 3 weeks for 1st it/nt and visit [facility] completed  Marge Duncans CNM, [facility] 11/09/2013 11:38 AM

## 2013-11-10 ENCOUNTER — Encounter: Payer: Self-pay | Admitting: Women's Health

## 2013-11-10 LAB — RUBELLA SCREEN: RUBELLA: 3.37 {index} — AB (ref <0.90)

## 2013-11-10 LAB — ABO AND RH: RH TYPE: NEGATIVE

## 2013-11-10 LAB — VARICELLA ZOSTER ANTIBODY, IGG: Varicella IgG: 442.5 Index — ABNORMAL HIGH (ref <135.00)

## 2013-11-10 LAB — OXYCODONE SCREEN, UA, RFLX CONFIRM: Oxycodone Screen, Ur: NEGATIVE ng/mL

## 2013-11-10 LAB — DRUG SCREEN, URINE, NO CONFIRMATION
Amphetamine Screen, Ur: NEGATIVE
BENZODIAZEPINES.: NEGATIVE
Barbiturate Quant, Ur: NEGATIVE
COCAINE METABOLITES: NEGATIVE
CREATININE, U: 122.3 mg/dL
Marijuana Metabolite: NEGATIVE
Methadone: NEGATIVE
Opiate Screen, Urine: NEGATIVE
PROPOXYPHENE: NEGATIVE
Phencyclidine (PCP): NEGATIVE

## 2013-11-10 LAB — URINALYSIS, ROUTINE W REFLEX MICROSCOPIC
Bilirubin Urine: NEGATIVE
Glucose, UA: NEGATIVE mg/dL
Hgb urine dipstick: NEGATIVE
Ketones, ur: NEGATIVE mg/dL
LEUKOCYTES UA: NEGATIVE
NITRITE: NEGATIVE
PROTEIN: NEGATIVE mg/dL
SPECIFIC GRAVITY, URINE: 1.013 (ref 1.005–1.030)
Urobilinogen, UA: 1 mg/dL (ref 0.0–1.0)
pH: 7.5 (ref 5.0–8.0)

## 2013-11-10 LAB — GC/CHLAMYDIA PROBE AMP
CT Probe RNA: NEGATIVE
GC Probe RNA: NEGATIVE

## 2013-11-10 LAB — RPR

## 2013-11-10 LAB — HEPATITIS B SURFACE ANTIGEN: HEP B S AG: NEGATIVE

## 2013-11-10 LAB — HIV ANTIBODY (ROUTINE TESTING W REFLEX): HIV: NONREACTIVE

## 2013-11-10 LAB — ANTIBODY SCREEN: Antibody Screen: NEGATIVE

## 2013-11-11 LAB — URINE CULTURE
COLONY COUNT: NO GROWTH
ORGANISM ID, BACTERIA: NO GROWTH

## 2013-11-11 LAB — CYSTIC FIBROSIS DIAGNOSTIC STUDY

## 2013-11-13 ENCOUNTER — Encounter: Payer: Self-pay | Admitting: Women's Health

## 2013-12-01 ENCOUNTER — Other Ambulatory Visit: Payer: Medicaid Other

## 2013-12-01 ENCOUNTER — Encounter: Payer: Self-pay | Admitting: Women's Health

## 2013-12-01 ENCOUNTER — Ambulatory Visit (INDEPENDENT_AMBULATORY_CARE_PROVIDER_SITE_OTHER): Payer: Medicaid Other | Admitting: Women's Health

## 2013-12-01 ENCOUNTER — Encounter: Payer: Medicaid Other | Admitting: Women's Health

## 2013-12-01 ENCOUNTER — Ambulatory Visit (INDEPENDENT_AMBULATORY_CARE_PROVIDER_SITE_OTHER): Payer: Medicaid Other

## 2013-12-01 VITALS — BP 136/76 | Wt 232.0 lb

## 2013-12-01 DIAGNOSIS — Z36 Encounter for antenatal screening of mother: Secondary | ICD-10-CM

## 2013-12-01 DIAGNOSIS — Z3481 Encounter for supervision of other normal pregnancy, first trimester: Secondary | ICD-10-CM

## 2013-12-01 DIAGNOSIS — Z331 Pregnant state, incidental: Secondary | ICD-10-CM

## 2013-12-01 DIAGNOSIS — Z1389 Encounter for screening for other disorder: Secondary | ICD-10-CM

## 2013-12-01 DIAGNOSIS — Z3480 Encounter for supervision of other normal pregnancy, unspecified trimester: Secondary | ICD-10-CM

## 2013-12-01 LAB — POCT URINALYSIS DIPSTICK
GLUCOSE UA: NEGATIVE
KETONES UA: NEGATIVE
Leukocytes, UA: NEGATIVE
Nitrite, UA: NEGATIVE
Protein, UA: NEGATIVE
RBC UA: NEGATIVE

## 2013-12-01 MED ORDER — CONCEPT DHA 53.5-38-1 MG PO CAPS: 1.0000 | ORAL_CAPSULE | Freq: Every day | ORAL | Status: DC

## 2013-12-01 NOTE — Progress Notes (Signed)
U/S(11+6wks)-single active fetus, CRL c/w dates, NT -1.13mm, NB present, cx appears closed, posterior Gr 0 placenta, Lt ovary with simple cyst=3.9 x 3.8cm, Rt ovary appears WNL, FHR-168 bpm

## 2013-12-01 NOTE — Progress Notes (Signed)
Low-risk OB appointment G3P1011 [redacted]w[redacted]d Estimated Date of Delivery: 06/16/14 BP 136/76  Wt 232 lb (105.235 kg)  LMP 09/09/2013  BP, weight, and urine reviewed.  Refer to obstetrical flow sheet for FH & FHR.  No fm yet. Denies cramping, lof, vb, or uti s/s. Wants rx for small pnv- she's not doing well w/ flinstones anymore. Rx concept dha.  Nausea, never started diclegis b/c she was scared to take any meds. Able to eat and keep foods down most of time. Reassured diclegis cat A.  Reviewed warning s/s to report. Plan:  Continue routine obstetrical care  F/U in 4wks for OB appointment and 2nd IT 1st IT/NT today

## 2013-12-01 NOTE — Patient Instructions (Signed)

## 2013-12-02 ENCOUNTER — Telehealth: Payer: Self-pay | Admitting: Adult Health

## 2013-12-02 NOTE — Telephone Encounter (Signed)
Pt c/o "stopped up nose, cough, sore throat, no fever." Pt informed per Cathie Beams, CNM can take OTC Robitussin, tylenol, gargle with warm salt water if no improvement call office back. Pt verbalized understanding.

## 2013-12-07 LAB — MATERNAL SCREEN, INTEGRATED #1

## 2013-12-22 ENCOUNTER — Telehealth: Payer: Self-pay | Admitting: Advanced Practice Midwife

## 2013-12-22 DIAGNOSIS — Z3481 Encounter for supervision of other normal pregnancy, first trimester: Secondary | ICD-10-CM

## 2013-12-22 NOTE — Telephone Encounter (Signed)
Spoke with pt and she stated that she was going to see her PCP tomorrow morning.

## 2013-12-23 ENCOUNTER — Encounter: Payer: Self-pay | Admitting: Women's Health

## 2013-12-24 ENCOUNTER — Telehealth: Payer: Self-pay | Admitting: *Deleted

## 2013-12-24 DIAGNOSIS — Z3481 Encounter for supervision of other normal pregnancy, first trimester: Secondary | ICD-10-CM

## 2013-12-24 MED ORDER — OSELTAMIVIR PHOSPHATE 75 MG PO CAPS: 75.0000 mg | ORAL_CAPSULE | Freq: Two times a day (BID) | ORAL | Status: DC

## 2013-12-24 NOTE — Telephone Encounter (Signed)
Pt called stating that she started having fever, chills and body aches last night. Pt denied any urinary frequency or burning with urination. Temperature was 101 last checked. I spoke with Dr Emelda FearFerguson and he advised to send Tamiflu 75mg  1 capsule BID for 5 days to the pt's pharmacy. Pt was advised of this and to push her fluids and rest. Pt verbalized understanding.

## 2013-12-29 ENCOUNTER — Encounter: Payer: Self-pay | Admitting: Advanced Practice Midwife

## 2013-12-29 ENCOUNTER — Ambulatory Visit (INDEPENDENT_AMBULATORY_CARE_PROVIDER_SITE_OTHER): Payer: Medicaid Other | Admitting: Advanced Practice Midwife

## 2013-12-29 ENCOUNTER — Other Ambulatory Visit: Payer: Self-pay | Admitting: Advanced Practice Midwife

## 2013-12-29 ENCOUNTER — Ambulatory Visit (INDEPENDENT_AMBULATORY_CARE_PROVIDER_SITE_OTHER): Payer: Medicaid Other

## 2013-12-29 VITALS — BP 110/60 | Wt 226.0 lb

## 2013-12-29 DIAGNOSIS — Z331 Pregnant state, incidental: Secondary | ICD-10-CM

## 2013-12-29 DIAGNOSIS — Z1389 Encounter for screening for other disorder: Secondary | ICD-10-CM

## 2013-12-29 DIAGNOSIS — O09892 Supervision of other high risk pregnancies, second trimester: Secondary | ICD-10-CM

## 2013-12-29 DIAGNOSIS — Z3492 Encounter for supervision of normal pregnancy, unspecified, second trimester: Secondary | ICD-10-CM

## 2013-12-29 DIAGNOSIS — O09212 Supervision of pregnancy with history of pre-term labor, second trimester: Principal | ICD-10-CM

## 2013-12-29 DIAGNOSIS — O039 Complete or unspecified spontaneous abortion without complication: Secondary | ICD-10-CM

## 2013-12-29 DIAGNOSIS — Z3482 Encounter for supervision of other normal pregnancy, second trimester: Secondary | ICD-10-CM

## 2013-12-29 DIAGNOSIS — Z3682 Encounter for antenatal screening for nuchal translucency: Secondary | ICD-10-CM

## 2013-12-29 DIAGNOSIS — Z23 Encounter for immunization: Secondary | ICD-10-CM

## 2013-12-29 NOTE — Progress Notes (Signed)
[id number] Estimated Date of Delivery: 06/16/14  Blood pressure 110/60, weight 226 lb (102.513 kg), last menstrual period 09/09/2013.   BP weight and urine results all reviewed and noted.  Please refer to the obstetrical flow sheet for the fundal height and fetal heart rate documentation:  Patient reports good fetal movement, denies any bleeding and no rupture of membranes symptoms or regular contractions. Patient still has nausea and some vomiting.  Forgets to take Diclegis sometimes. Doesn't want any other antiemetic, plans to do better with Diclegis.  Ensure/Boost or similar recommended TVUS measured cx d/t hx of 21 week SAB ? Incompetent cx.  Case reviewed with JVF.  Will monitor cx length q 2 weeks until 24 weeks All questions were answered.  Plan:  Continued routine obstetrical care, Flu shot/2nd IT today Follow up in 3 weeks for OB appointment,

## 2013-12-29 NOTE — Progress Notes (Signed)
U/S(15+6wks)-vtx active fetus, FHR- 151 bpm, posterior Gr 0 placenta, Lt ovary simple cyst remains=4.3x 3.2cm, no free fluid noted within the pelvis, Cervix-3.1cm closed no funneling noted with fundal pressure applied=3.0cm

## 2014-01-01 LAB — MATERNAL SCREEN, INTEGRATED #2
AFP MOM MAT SCREEN: 0.7
AFP, SERUM MAT SCREEN: 18.2 ng/mL
Age risk Down Syndrome: 1:1000 {titer}
CROWN RUMP LENGTH MAT SCREEN 2: 60.3 mm
Calculated Gestational Age: 16.3
Estriol Mom: 0.91
Estriol, Free: 0.7 ng/mL
HCG, SERUM MAT SCREEN: 57.6 [IU]/mL
Inhibin A Dimeric: 128 pg/mL
Inhibin A MoM: 0.91
NT MOM MAT SCREEN: 0.97
NUCHAL TRANSLUCENCY MAT SCREEN 2: 1.35 mm
NUMBER OF FETUSES MAT SCREEN 2: 1
PAPP-A MAT SCREEN: 358 ng/mL
PAPP-A MOM MAT SCREEN: 0.95
Rish for ONTD: <1:5000 {titer}
hCG MoM: 2.19

## 2014-01-03 ENCOUNTER — Encounter: Payer: Medicaid Other | Admitting: Obstetrics and Gynecology

## 2014-01-03 ENCOUNTER — Telehealth: Payer: Self-pay | Admitting: Obstetrics and Gynecology

## 2014-01-03 DIAGNOSIS — Z3481 Encounter for supervision of other normal pregnancy, first trimester: Secondary | ICD-10-CM

## 2014-01-03 NOTE — Telephone Encounter (Signed)
Pt states that she thinks she has pulled a muscle in the middle of her back. Pt states that it hurts to bend over, do her hair or anything. Pt was given an appointment today to see Dr Emelda FearFerguson. Pt stated that she would have to call me back and let me know for sure.

## 2014-01-05 ENCOUNTER — Encounter: Payer: Self-pay | Admitting: Advanced Practice Midwife

## 2014-01-10 ENCOUNTER — Encounter: Payer: Self-pay | Admitting: Advanced Practice Midwife

## 2014-01-12 ENCOUNTER — Other Ambulatory Visit: Payer: Self-pay | Admitting: Advanced Practice Midwife

## 2014-01-12 ENCOUNTER — Ambulatory Visit (INDEPENDENT_AMBULATORY_CARE_PROVIDER_SITE_OTHER): Payer: Medicaid Other

## 2014-01-12 ENCOUNTER — Ambulatory Visit (INDEPENDENT_AMBULATORY_CARE_PROVIDER_SITE_OTHER): Payer: Medicaid Other | Admitting: Advanced Practice Midwife

## 2014-01-12 VITALS — BP 110/58 | Wt 224.0 lb

## 2014-01-12 DIAGNOSIS — O09212 Supervision of pregnancy with history of pre-term labor, second trimester: Secondary | ICD-10-CM

## 2014-01-12 DIAGNOSIS — Z331 Pregnant state, incidental: Secondary | ICD-10-CM

## 2014-01-12 DIAGNOSIS — O09292 Supervision of pregnancy with other poor reproductive or obstetric history, second trimester: Secondary | ICD-10-CM

## 2014-01-12 DIAGNOSIS — Z3492 Encounter for supervision of normal pregnancy, unspecified, second trimester: Secondary | ICD-10-CM

## 2014-01-12 DIAGNOSIS — Z1389 Encounter for screening for other disorder: Secondary | ICD-10-CM

## 2014-01-12 DIAGNOSIS — Z3482 Encounter for supervision of other normal pregnancy, second trimester: Secondary | ICD-10-CM

## 2014-01-12 LAB — POCT URINALYSIS DIPSTICK
Glucose, UA: NEGATIVE
Glucose, UA: NEGATIVE
KETONES UA: NEGATIVE
Leukocytes, UA: NEGATIVE
NITRITE UA: NEGATIVE
PROTEIN UA: NEGATIVE
Protein, UA: NEGATIVE
RBC UA: NEGATIVE

## 2014-01-12 NOTE — Progress Notes (Signed)
U/S(17+6wks)-active fetus, meas c/w dates, fluid wnl, posterior Gr 0 placenta, Lt ovary with simple 4.6cm cyst remains, no free fluid noted within the pelvis, no major abnl noted although unable to clearly identify cardiac OFT's, cx-3.1cm no funneling noted (measured vaginally)

## 2014-01-12 NOTE — Progress Notes (Signed)
[id number] 7840w6d Estimated Date of Delivery: 06/16/14  Blood pressure 110/58, weight 224 lb (101.606 kg), last menstrual period 09/09/2013.   BP weight and urine results all reviewed and noted.  Please refer to the obstetrical flow sheet for the fundal height and fetal heart rate documentation: Had cx length and anatomy scan today.  All normal, couldn't see outflow tracts.  Patient reports good fetal movement, denies any bleeding and no rupture of membranes symptoms or regular contractions. Patient is without complaints. All questions were answered.  Plan:  Continued routine obstetrical care, continue cx length q 2 weeks d/t hx 2nd trimester loss  Follow up in 2 weeks for OB appointment, possibly rescan for OFT

## 2014-01-16 LAB — US OB DETAIL + 14 WK

## 2014-01-26 ENCOUNTER — Ambulatory Visit (INDEPENDENT_AMBULATORY_CARE_PROVIDER_SITE_OTHER): Payer: Medicaid Other

## 2014-01-26 ENCOUNTER — Other Ambulatory Visit: Payer: Self-pay | Admitting: Advanced Practice Midwife

## 2014-01-26 ENCOUNTER — Ambulatory Visit (INDEPENDENT_AMBULATORY_CARE_PROVIDER_SITE_OTHER): Payer: Medicaid Other | Admitting: Advanced Practice Midwife

## 2014-01-26 VITALS — BP 130/58 | Wt 226.0 lb

## 2014-01-26 DIAGNOSIS — Z3482 Encounter for supervision of other normal pregnancy, second trimester: Secondary | ICD-10-CM

## 2014-01-26 DIAGNOSIS — O09292 Supervision of pregnancy with other poor reproductive or obstetric history, second trimester: Secondary | ICD-10-CM

## 2014-01-26 DIAGNOSIS — Z1389 Encounter for screening for other disorder: Secondary | ICD-10-CM

## 2014-01-26 DIAGNOSIS — Z331 Pregnant state, incidental: Secondary | ICD-10-CM

## 2014-01-26 DIAGNOSIS — Z3492 Encounter for supervision of normal pregnancy, unspecified, second trimester: Secondary | ICD-10-CM

## 2014-01-26 DIAGNOSIS — O039 Complete or unspecified spontaneous abortion without complication: Secondary | ICD-10-CM

## 2014-01-26 DIAGNOSIS — O09299 Supervision of pregnancy with other poor reproductive or obstetric history, unspecified trimester: Secondary | ICD-10-CM | POA: Insufficient documentation

## 2014-01-26 DIAGNOSIS — Z3481 Encounter for supervision of other normal pregnancy, first trimester: Secondary | ICD-10-CM

## 2014-01-26 LAB — POCT URINALYSIS DIPSTICK
Blood, UA: NEGATIVE
GLUCOSE UA: NEGATIVE
Ketones, UA: NEGATIVE
Leukocytes, UA: NEGATIVE
Nitrite, UA: NEGATIVE
PROTEIN UA: NEGATIVE

## 2014-01-26 NOTE — Progress Notes (Signed)
U/S(19+6wks)-cervical length- vtx active fetus, FHR-148 bpm, female fetus, posterior Gr 0 placenta, cx long and closed 3.6cm no change or funneling noted with fundal pressure applied (3.6cm)

## 2014-01-26 NOTE — Progress Notes (Signed)
[id number] 4165w6d Estimated Date of Delivery: 06/16/14  Last menstrual period 09/09/2013.   BP weight and urine results all reviewed and noted.  Please refer to the obstetrical flow sheet for the fundal height and fetal heart rate documentation: Cx length today 3.6 cms/no funneling  Patient reports good fetal movement, denies any bleeding and no rupture of membranes symptoms or regular contractions. Patient is without complaints. All questions were answered.  Plan:  Continued routine obstetrical care, continue 2 week cx length until 24 weeks  Follow up in 2 weeks for OB appointment, cervical length us

## 2014-02-09 ENCOUNTER — Other Ambulatory Visit: Payer: Self-pay | Admitting: Advanced Practice Midwife

## 2014-02-09 ENCOUNTER — Ambulatory Visit (INDEPENDENT_AMBULATORY_CARE_PROVIDER_SITE_OTHER): Payer: Medicaid Other

## 2014-02-09 ENCOUNTER — Ambulatory Visit (INDEPENDENT_AMBULATORY_CARE_PROVIDER_SITE_OTHER): Payer: Medicaid Other | Admitting: Women's Health

## 2014-02-09 ENCOUNTER — Encounter: Payer: Self-pay | Admitting: Women's Health

## 2014-02-09 VITALS — BP 120/64 | Wt 229.0 lb

## 2014-02-09 DIAGNOSIS — O09212 Supervision of pregnancy with history of pre-term labor, second trimester: Secondary | ICD-10-CM

## 2014-02-09 DIAGNOSIS — O09292 Supervision of pregnancy with other poor reproductive or obstetric history, second trimester: Secondary | ICD-10-CM

## 2014-02-09 DIAGNOSIS — Z1389 Encounter for screening for other disorder: Secondary | ICD-10-CM

## 2014-02-09 DIAGNOSIS — Z3482 Encounter for supervision of other normal pregnancy, second trimester: Secondary | ICD-10-CM

## 2014-02-09 DIAGNOSIS — Z3492 Encounter for supervision of normal pregnancy, unspecified, second trimester: Secondary | ICD-10-CM

## 2014-02-09 DIAGNOSIS — O09892 Supervision of other high risk pregnancies, second trimester: Secondary | ICD-10-CM

## 2014-02-09 DIAGNOSIS — Z331 Pregnant state, incidental: Secondary | ICD-10-CM

## 2014-02-09 LAB — POCT URINALYSIS DIPSTICK
Glucose, UA: NEGATIVE
Ketones, UA: NEGATIVE
LEUKOCYTES UA: NEGATIVE
NITRITE UA: NEGATIVE
Protein, UA: NEGATIVE
RBC UA: NEGATIVE

## 2014-02-09 NOTE — Progress Notes (Signed)
Low-risk OB appointment G3P1011 3251w6d Estimated Date of Delivery: 06/16/14 BP 120/64 mmHg  Wt 229 lb (103.874 kg)  LMP 09/09/2013  BP, weight, and urine reviewed.  Refer to obstetrical flow sheet for FH & FHR.  Reports good fm.  Denies regular uc's, lof, vb, or uti s/s. No complaints.  Reviewed today's u/s CL 3.9cm. Discussed ptl s/s, fm.  Plan:  Continue routine obstetrical care  F/U in 2wks for OB appointment and CL u/s

## 2014-02-09 NOTE — Patient Instructions (Addendum)
Call the office [phone]) or go to [facility] if:  You begin to have strong, frequent contractions  Your water breaks.  Sometimes it is a big gush of fluid, sometimes it is just a trickle that keeps getting your panties wet or running down your legs  You have vaginal bleeding.  It is normal to have a small amount of spotting if your cervix was checked.   [place] Pediatricians:  [facility] [phone]            [facility] [phone]                 [facility] [phone] (usually doesn't accept new patients unless you have family there already, you are always welcome to call and ask)             [facility] ([address]) [phone]   [facility] Pediatricians:   [facility]: [phone]  [facility]: [phone]   Second Trimester of Pregnancy The second trimester is from week 13 through week 28, months 4 through 6. The second trimester is often a time when you feel your best. Your body has also adjusted to being pregnant, and you begin to feel better physically. Usually, morning sickness has lessened or quit completely, you may have more energy, and you may have an increase in appetite. The second trimester is also a time when the fetus is growing rapidly. At the end of the sixth month, the fetus is about 9 inches long and weighs about 1 pounds. You will likely begin to feel the baby move (quickening) between 18 and 20 weeks of the pregnancy. BODY CHANGES Your body goes through many changes during pregnancy. The changes vary from woman to woman.  5. Your weight will continue to increase. You will notice your lower abdomen bulging out. 6. You may begin to get stretch marks on your hips, abdomen, and breasts. 7. You may develop headaches that can be relieved by medicines approved by your health care provider. 8. You may urinate more often because the fetus is  pressing on your bladder. 9. You may develop or continue to have heartburn as a result of your pregnancy. 10. You may develop constipation because certain hormones are causing the muscles that push waste through your intestines to slow down. 11. You may develop hemorrhoids or swollen, bulging veins (varicose veins). 12. You may have back pain because of the weight gain and pregnancy hormones relaxing your joints between the bones in your pelvis and as a result of a shift in weight and the muscles that support your balance. 13. Your breasts will continue to grow and be tender. 14. Your gums may bleed and may be sensitive to brushing and flossing. 15. Dark spots or blotches (chloasma, mask of pregnancy) may develop on your face. This will likely fade after the baby is born. 16. A dark line from your belly button to the pubic area (linea nigra) may appear. This will likely fade after the baby is born. 17. You may have changes in your hair. These can include thickening of your hair, rapid growth, and changes in texture. Some women also have hair loss during or after pregnancy, or hair that feels dry or thin. Your hair will most likely return to normal after your baby is born. WHAT TO EXPECT AT YOUR PRENATAL VISITS During a routine prenatal visit: 2. You will be weighed to make sure you and the fetus are  growing normally. 3. Your blood pressure will be taken. 4. Your abdomen will be measured to track your baby's growth. 5. The fetal heartbeat will be listened to. 6. Any test results from the previous visit will be discussed. Your health care provider may ask you: 2. How you are feeling. 3. If you are feeling the baby move. 4. If you have had any abnormal symptoms, such as leaking fluid, bleeding, severe headaches, or abdominal cramping. 5. If you have any questions. Other tests that may be performed during your second trimester include: 2. Blood tests that check for: 1. Low iron levels  (anemia). 2. Gestational diabetes (between 24 and 28 weeks). 3. Rh antibodies. 3. Urine tests to check for infections, diabetes, or protein in the urine. 4. An ultrasound to confirm the proper growth and development of the baby. 5. An amniocentesis to check for possible genetic problems. 6. Fetal screens for spina bifida and Down syndrome. HOME CARE INSTRUCTIONS  3. Avoid all smoking, herbs, alcohol, and unprescribed drugs. These chemicals affect the formation and growth of the baby. 4. Follow your health care provider's instructions regarding medicine use. There are medicines that are either safe or unsafe to take during pregnancy. 5. Exercise only as directed by your health care provider. Experiencing uterine cramps is a good sign to stop exercising. 6. Continue to eat regular, healthy meals. 7. Wear a good support bra for breast tenderness. 8. Do not use hot tubs, steam rooms, or saunas. 9. Wear your seat belt at all times when driving. 10. Avoid raw meat, uncooked cheese, cat litter boxes, and soil used by cats. These carry germs that can cause birth defects in the baby. 11. Take your prenatal vitamins. 12. Try taking a stool softener (if your health care provider approves) if you develop constipation. Eat more high-fiber foods, such as fresh vegetables or fruit and whole grains. Drink plenty of fluids to keep your urine clear or pale yellow. 13. Take warm sitz baths to soothe any pain or discomfort caused by hemorrhoids. Use hemorrhoid cream if your health care provider approves. 14. If you develop varicose veins, wear support hose. Elevate your feet for 15 minutes, 3-4 times a day. Limit salt in your diet. 15. Avoid heavy lifting, wear low heel shoes, and practice good posture. 16. Rest with your legs elevated if you have leg cramps or low back pain. 17. Visit your dentist if you have not gone yet during your pregnancy. Use a soft toothbrush to brush your teeth and be gentle when you  floss. 18. A sexual relationship may be continued unless your health care provider directs you otherwise. 19. Continue to go to all your prenatal visits as directed by your health care provider. SEEK MEDICAL CARE IF:   You have dizziness.  You have mild pelvic cramps, pelvic pressure, or nagging pain in the abdominal area.  You have persistent nausea, vomiting, or diarrhea.  You have a bad smelling vaginal discharge.  You have pain with urination. SEEK IMMEDIATE MEDICAL CARE IF:   You have a fever.  You are leaking fluid from your vagina.  You have spotting or bleeding from your vagina.  You have severe abdominal cramping or pain.  You have rapid weight gain or loss.  You have shortness of breath with chest pain.  You notice sudden or extreme swelling of your face, hands, ankles, feet, or legs.  You have not felt your baby move in over an hour.  You have severe headaches that do  not go away with medicine.  You have vision changes. Document Released: 02/19/2001 Document Revised: 03/02/2013 Document Reviewed: 04/28/2012 [facility] Patient Information 2015 [employer] [place]. This information is not intended to replace advice given to you by your health care provider. Make sure you discuss any questions you have with your health care provider.

## 2014-02-09 NOTE — Progress Notes (Signed)
Single, active female fetus at 21+[redacted] weeks GA in a cephalic presentation. FHR 150 bpm. Cervix measured transvaginally and is closed with a length of 3.9cm.  Posterior Gr 0 placenta.  Fluid is within normal limits with a SVP of 4.85cm.  Right ovary is within normal limits.  Left ovary demonstrates a persistent simple cyst measuring 4.5 cm today. Outflow tracts visualized today and appear normal.  EFW of 463g (1lb) is consistent with dating.

## 2014-02-23 ENCOUNTER — Ambulatory Visit (INDEPENDENT_AMBULATORY_CARE_PROVIDER_SITE_OTHER): Payer: Medicaid Other | Admitting: Advanced Practice Midwife

## 2014-02-23 ENCOUNTER — Other Ambulatory Visit: Payer: Self-pay | Admitting: Advanced Practice Midwife

## 2014-02-23 ENCOUNTER — Other Ambulatory Visit: Payer: Self-pay | Admitting: Women's Health

## 2014-02-23 ENCOUNTER — Ambulatory Visit (INDEPENDENT_AMBULATORY_CARE_PROVIDER_SITE_OTHER): Payer: Medicaid Other

## 2014-02-23 ENCOUNTER — Encounter: Payer: Self-pay | Admitting: Advanced Practice Midwife

## 2014-02-23 VITALS — BP 132/60 | Wt 230.0 lb

## 2014-02-23 DIAGNOSIS — O3482 Maternal care for other abnormalities of pelvic organs, second trimester: Secondary | ICD-10-CM

## 2014-02-23 DIAGNOSIS — N83209 Unspecified ovarian cyst, unspecified side: Secondary | ICD-10-CM

## 2014-02-23 DIAGNOSIS — O09292 Supervision of pregnancy with other poor reproductive or obstetric history, second trimester: Secondary | ICD-10-CM

## 2014-02-23 DIAGNOSIS — O26892 Other specified pregnancy related conditions, second trimester: Secondary | ICD-10-CM

## 2014-02-23 DIAGNOSIS — R102 Pelvic and perineal pain: Secondary | ICD-10-CM

## 2014-02-23 DIAGNOSIS — Z1389 Encounter for screening for other disorder: Secondary | ICD-10-CM

## 2014-02-23 DIAGNOSIS — O09892 Supervision of other high risk pregnancies, second trimester: Secondary | ICD-10-CM

## 2014-02-23 DIAGNOSIS — N949 Unspecified condition associated with female genital organs and menstrual cycle: Secondary | ICD-10-CM

## 2014-02-23 DIAGNOSIS — Z3492 Encounter for supervision of normal pregnancy, unspecified, second trimester: Secondary | ICD-10-CM

## 2014-02-23 DIAGNOSIS — Z3482 Encounter for supervision of other normal pregnancy, second trimester: Secondary | ICD-10-CM

## 2014-02-23 DIAGNOSIS — N8320 Unspecified ovarian cysts: Secondary | ICD-10-CM

## 2014-02-23 DIAGNOSIS — O09212 Supervision of pregnancy with history of pre-term labor, second trimester: Secondary | ICD-10-CM

## 2014-02-23 DIAGNOSIS — Z331 Pregnant state, incidental: Secondary | ICD-10-CM

## 2014-02-23 LAB — US OB FOLLOW UP

## 2014-02-23 LAB — POCT URINALYSIS DIPSTICK
GLUCOSE UA: NEGATIVE
Ketones, UA: NEGATIVE
Leukocytes, UA: NEGATIVE
Nitrite, UA: NEGATIVE
Protein, UA: NEGATIVE
RBC UA: NEGATIVE

## 2014-02-23 LAB — US OB LIMITED

## 2014-02-23 LAB — US OB TRANSVAGINAL

## 2014-02-23 NOTE — Progress Notes (Signed)
[id number] 3570w6d Estimated Date of Delivery: 06/16/14  Blood pressure 132/60, weight 230 lb (104.327 kg), last menstrual period 09/09/2013.   BP weight and urine results all reviewed and noted.  Please refer to the obstetrical flow sheet for the fundal height and fetal heart rate documentation: Cx length 3.5cms.  Patient reports good fetal movement, denies any bleeding and no rupture of membranes symptoms or regular contractions. Patient is without complaints. All questions were answered.  Plan:  Continued routine obstetrical care, no more cx length us  Follow up in 3 weeks for OB appointment, PN2

## 2014-02-23 NOTE — Patient Instructions (Signed)

## 2014-02-23 NOTE — Progress Notes (Signed)
U/S(23+6wks)- vtx active fetus, FHR- 157 bpm, Lt simple ovarian cyst remains with slight increase in size noted=5.1 x 3.7 x 3.5cm, no free fluid noted, Cx appears closed (3.5cm) no change noted with fundal pressure applied, female fetus

## 2014-03-11 NOTE — L&D Delivery Note (Signed)
Delivery Note At 9:59 PM a viable and healthy female was delivered via Vaginal, Spontaneous Delivery (Presentation: Right Occiput Anterior).  APGAR: 8, 9; weight  .   Placenta status: Intact, Spontaneous.  Cord: 3 vessels with the following complications: Knot.    Anesthesia: Epidural  Episiotomy: None Lacerations: None Suture Repair: n/a Est. Blood Loss (mL): 300  Mom to postpartum.  Baby to Couplet care / Skin to Skin. Vigorous female infant delivered on the first push over intact perineum. Immediate cry. Placed on maternal abdomen. Cord clamped and cut. Placenta delivered quickly without complications. Knot in distal portion of cord noted with good blood flow throughout it as evidenced by pulsating cord.  Beverely Lowdamo, Elena 06/10/2014, 10:51 PM   Patient is a G3P1011 at 6232w1d who was admitted w/ SOL, uncomplicated prenatal course.  She progressed with augmentation via Pitocin.  I was gloved and present immediately after delivery.  Second stage of labor progressed, baby delivered after one contraction.    Complications: none  Lacerations: none   Faiz Weber, CNM 11:19 PM

## 2014-03-16 ENCOUNTER — Encounter: Payer: Self-pay | Admitting: Women's Health

## 2014-03-16 ENCOUNTER — Ambulatory Visit (INDEPENDENT_AMBULATORY_CARE_PROVIDER_SITE_OTHER): Payer: Medicaid Other | Admitting: Women's Health

## 2014-03-16 ENCOUNTER — Other Ambulatory Visit: Payer: Medicaid Other

## 2014-03-16 ENCOUNTER — Other Ambulatory Visit (HOSPITAL_COMMUNITY)
Admission: RE | Admit: 2014-03-16 | Discharge: 2014-03-16 | Disposition: A | Discharge status: Home / Self Care | Payer: Medicaid Other | Source: Ambulatory Visit | Attending: Obstetrics & Gynecology | Admitting: Obstetrics & Gynecology

## 2014-03-16 VITALS — BP 116/62 | Wt 230.0 lb

## 2014-03-16 DIAGNOSIS — Z331 Pregnant state, incidental: Secondary | ICD-10-CM

## 2014-03-16 DIAGNOSIS — Z3482 Encounter for supervision of other normal pregnancy, second trimester: Secondary | ICD-10-CM

## 2014-03-16 DIAGNOSIS — R87612 Low grade squamous intraepithelial lesion on cytologic smear of cervix (LGSIL): Secondary | ICD-10-CM

## 2014-03-16 DIAGNOSIS — Z113 Encounter for screening for infections with a predominantly sexual mode of transmission: Secondary | ICD-10-CM

## 2014-03-16 DIAGNOSIS — Z131 Encounter for screening for diabetes mellitus: Secondary | ICD-10-CM

## 2014-03-16 DIAGNOSIS — Z124 Encounter for screening for malignant neoplasm of cervix: Secondary | ICD-10-CM

## 2014-03-16 DIAGNOSIS — Z114 Encounter for screening for human immunodeficiency virus [HIV]: Secondary | ICD-10-CM

## 2014-03-16 DIAGNOSIS — Z01419 Encounter for gynecological examination (general) (routine) without abnormal findings: Secondary | ICD-10-CM | POA: Diagnosis present

## 2014-03-16 DIAGNOSIS — Z0184 Encounter for antibody response examination: Secondary | ICD-10-CM

## 2014-03-16 DIAGNOSIS — Z1389 Encounter for screening for other disorder: Secondary | ICD-10-CM

## 2014-03-16 DIAGNOSIS — R87619 Unspecified abnormal cytological findings in specimens from cervix uteri: Secondary | ICD-10-CM

## 2014-03-16 LAB — CBC
HCT: 29.6 % — ABNORMAL LOW (ref 36.0–46.0)
Hemoglobin: 9.4 g/dL — ABNORMAL LOW (ref 12.0–15.0)
MCH: 24.6 pg — AB (ref 26.0–34.0)
MCHC: 31.8 g/dL (ref 30.0–36.0)
MCV: 77.5 fL — ABNORMAL LOW (ref 78.0–100.0)
MPV: 9.7 fL (ref 8.6–12.4)
Platelets: 213 10*3/uL (ref 150–400)
RBC: 3.82 MIL/uL — ABNORMAL LOW (ref 3.87–5.11)
RDW: 15.8 % — ABNORMAL HIGH (ref 11.5–15.5)
WBC: 7.2 10*3/uL (ref 4.0–10.5)

## 2014-03-16 LAB — POCT URINALYSIS DIPSTICK
Glucose, UA: NEGATIVE
Ketones, UA: NEGATIVE
Leukocytes, UA: NEGATIVE
NITRITE UA: NEGATIVE
Protein, UA: NEGATIVE
RBC UA: NEGATIVE

## 2014-03-16 NOTE — Progress Notes (Signed)
Low-risk OB appointment G3P1011 2585w6d Estimated Date of Delivery: 06/16/14 BP 116/62 mmHg  Wt 230 lb (104.327 kg)  LMP 09/09/2013  BP, weight, and urine reviewed.  Refer to obstetrical flow sheet for FH & FHR.  Reports good fm.  Denies regular uc's, lof, vb, or uti s/s. No complaints. Thin prep pap obtained. Cx visually closed.  Reviewed ptl s/s, fkc. Recommended Tdap at HD/PCP per CDC guidelines.  Plan:  Continue routine obstetrical care  F/U in 4wks for OB appointment  PN2 today

## 2014-03-16 NOTE — Patient Instructions (Signed)

## 2014-03-17 LAB — ANTIBODY SCREEN: Antibody Screen: NEGATIVE

## 2014-03-17 LAB — GLUCOSE TOLERANCE, 2 HOURS W/ 1HR
GLUCOSE, 2 HOUR: 89 mg/dL (ref 70–139)
Glucose, 1 hour: 115 mg/dL (ref 70–170)
Glucose, Fasting: 88 mg/dL (ref 70–99)

## 2014-03-17 LAB — RPR

## 2014-03-17 LAB — HIV ANTIBODY (ROUTINE TESTING W REFLEX): HIV 1&2 Ab, 4th Generation: NONREACTIVE

## 2014-03-18 LAB — HSV 2 ANTIBODY, IGG: HSV 2 Glycoprotein G Ab, IgG: <0.1 IV

## 2014-03-18 LAB — CYTOLOGY - PAP

## 2014-03-21 ENCOUNTER — Encounter: Payer: Self-pay | Admitting: Women's Health

## 2014-03-21 ENCOUNTER — Other Ambulatory Visit: Payer: Self-pay | Admitting: Women's Health

## 2014-03-21 DIAGNOSIS — O99013 Anemia complicating pregnancy, third trimester: Secondary | ICD-10-CM | POA: Insufficient documentation

## 2014-03-21 DIAGNOSIS — Z3482 Encounter for supervision of other normal pregnancy, second trimester: Secondary | ICD-10-CM

## 2014-03-21 MED ORDER — FERROUS SULFATE 325 (65 FE) MG PO TABS: 325.0000 mg | ORAL_TABLET | Freq: Two times a day (BID) | ORAL | Status: DC

## 2014-03-28 ENCOUNTER — Encounter: Payer: Self-pay | Admitting: Women's Health

## 2014-04-13 ENCOUNTER — Ambulatory Visit (INDEPENDENT_AMBULATORY_CARE_PROVIDER_SITE_OTHER): Payer: Medicaid Other | Admitting: Advanced Practice Midwife

## 2014-04-13 ENCOUNTER — Encounter: Payer: Self-pay | Admitting: Advanced Practice Midwife

## 2014-04-13 VITALS — BP 100/60 | Wt 233.0 lb

## 2014-04-13 DIAGNOSIS — O360931 Maternal care for other rhesus isoimmunization, third trimester, fetus 1: Secondary | ICD-10-CM

## 2014-04-13 DIAGNOSIS — Z6791 Unspecified blood type, Rh negative: Secondary | ICD-10-CM

## 2014-04-13 DIAGNOSIS — Z331 Pregnant state, incidental: Secondary | ICD-10-CM

## 2014-04-13 DIAGNOSIS — Z1389 Encounter for screening for other disorder: Secondary | ICD-10-CM

## 2014-04-13 LAB — POCT URINALYSIS DIPSTICK
Blood, UA: NEGATIVE
Glucose, UA: NEGATIVE
Ketones, UA: NEGATIVE
Nitrite, UA: NEGATIVE
Protein, UA: NEGATIVE

## 2014-04-13 MED ORDER — RHO D IMMUNE GLOBULIN 1500 UNIT/2ML IJ SOSY
300.0000 ug | PREFILLED_SYRINGE | Freq: Once | INTRAMUSCULAR | Status: AC
  Administered 2014-04-13: 300 ug via INTRAMUSCULAR

## 2014-04-13 NOTE — Addendum Note (Signed)
Addended by: Criss AlvinePULLIAM, Gradyn Shein G on: 04/13/2014 11:29 AM   Modules accepted: Orders

## 2014-04-13 NOTE — Progress Notes (Signed)
[id number] 4047w6d Estimated Date of Delivery: 06/16/14  Blood pressure 100/60, weight 105.688 kg (233 lb), last menstrual period 09/09/2013.   BP weight and urine results all reviewed and noted.  Please refer to the obstetrical flow sheet for the fundal height and fetal heart rate documentation:  Patient reports good fetal movement, denies any bleeding and no rupture of membranes symptoms or regular contractions. Patient is without complaints. All questions were answered.  Plan:  Continued routine obstetrical care, Rhogam today L deltoid  Follow up in 2 weeks for OB appointment,

## 2014-04-26 ENCOUNTER — Encounter: Payer: Medicaid Other | Admitting: Women's Health

## 2014-05-02 ENCOUNTER — Ambulatory Visit (INDEPENDENT_AMBULATORY_CARE_PROVIDER_SITE_OTHER): Payer: Medicaid Other | Admitting: Women's Health

## 2014-05-02 ENCOUNTER — Encounter: Payer: Self-pay | Admitting: Women's Health

## 2014-05-02 VITALS — BP 124/70 | Wt 232.0 lb

## 2014-05-02 DIAGNOSIS — O9989 Other specified diseases and conditions complicating pregnancy, childbirth and the puerperium: Secondary | ICD-10-CM

## 2014-05-02 DIAGNOSIS — Z1389 Encounter for screening for other disorder: Secondary | ICD-10-CM

## 2014-05-02 DIAGNOSIS — O99013 Anemia complicating pregnancy, third trimester: Secondary | ICD-10-CM

## 2014-05-02 DIAGNOSIS — Z331 Pregnant state, incidental: Secondary | ICD-10-CM

## 2014-05-02 DIAGNOSIS — O26813 Pregnancy related exhaustion and fatigue, third trimester: Secondary | ICD-10-CM

## 2014-05-02 DIAGNOSIS — O360131 Maternal care for anti-D [Rh] antibodies, third trimester, fetus 1: Secondary | ICD-10-CM

## 2014-05-02 DIAGNOSIS — Z3493 Encounter for supervision of normal pregnancy, unspecified, third trimester: Secondary | ICD-10-CM

## 2014-05-02 DIAGNOSIS — R0602 Shortness of breath: Secondary | ICD-10-CM

## 2014-05-02 LAB — POCT URINALYSIS DIPSTICK
Blood, UA: NEGATIVE
Glucose, UA: NEGATIVE
Ketones, UA: NEGATIVE
Leukocytes, UA: NEGATIVE
Nitrite, UA: NEGATIVE
PROTEIN UA: NEGATIVE

## 2014-05-02 NOTE — Progress Notes (Signed)
Low-risk OB appointment G3P1011 6549w4d Estimated Date of Delivery: 06/16/14 BP 124/70 mmHg  Wt 232 lb (105.235 kg)  LMP 09/09/2013  BP, weight, and urine reviewed.  Refer to obstetrical flow sheet for FH & FHR.  Reports good fm.  Denies regular uc's, lof, vb, or uti s/s. Taking fe as directed, does have occ sob. Will recheck cbc today. Has darkened area on neck over thyroid, appears to be normal hyperpigmentation changes w/ pregnancy, d/t location, will check tsh. Has occ episodes of feeling like she can't catch breath, usually while at rest. No other associated symptoms. HRRR, LCTAB. If begins to happen more frequently, or begins to have any other sx to let us know/seek care.  Reviewed ptl s/s, fkc. Plan:  Continue routine obstetrical care  F/U in 2wks for OB appointment

## 2014-05-02 NOTE — Patient Instructions (Signed)

## 2014-05-03 ENCOUNTER — Encounter: Payer: Self-pay | Admitting: Women's Health

## 2014-05-03 LAB — TSH: TSH: 2.84 u[IU]/mL (ref 0.450–4.500)

## 2014-05-03 LAB — CBC
HCT: 33.9 % — ABNORMAL LOW (ref 34.0–46.6)
Hemoglobin: 11 g/dL — ABNORMAL LOW (ref 11.1–15.9)
MCH: 25.9 pg — ABNORMAL LOW (ref 26.6–33.0)
MCHC: 32.4 g/dL (ref 31.5–35.7)
MCV: 80 fL (ref 79–97)
Platelets: 178 10*3/uL (ref 150–379)
RBC: 4.25 x10E6/uL (ref 3.77–5.28)
RDW: 19.4 % — ABNORMAL HIGH (ref 12.3–15.4)
WBC: 6.2 10*3/uL (ref 3.4–10.8)

## 2014-05-16 ENCOUNTER — Encounter (HOSPITAL_COMMUNITY): Payer: Self-pay | Admitting: *Deleted

## 2014-05-16 ENCOUNTER — Telehealth: Payer: Self-pay | Admitting: Advanced Practice Midwife

## 2014-05-16 ENCOUNTER — Inpatient Hospital Stay (HOSPITAL_COMMUNITY)
Admission: AD | Admit: 2014-05-16 | Discharge: 2014-05-16 | Disposition: A | Discharge status: Home / Self Care | Payer: Medicaid Other | Source: Ambulatory Visit | Attending: Obstetrics & Gynecology | Admitting: Obstetrics & Gynecology

## 2014-05-16 DIAGNOSIS — Z3A35 35 weeks gestation of pregnancy: Secondary | ICD-10-CM | POA: Insufficient documentation

## 2014-05-16 DIAGNOSIS — K529 Noninfective gastroenteritis and colitis, unspecified: Secondary | ICD-10-CM | POA: Diagnosis not present

## 2014-05-16 DIAGNOSIS — Z87891 Personal history of nicotine dependence: Secondary | ICD-10-CM | POA: Insufficient documentation

## 2014-05-16 DIAGNOSIS — O212 Late vomiting of pregnancy: Secondary | ICD-10-CM | POA: Diagnosis present

## 2014-05-16 DIAGNOSIS — O9989 Other specified diseases and conditions complicating pregnancy, childbirth and the puerperium: Secondary | ICD-10-CM | POA: Insufficient documentation

## 2014-05-16 LAB — URINALYSIS, ROUTINE W REFLEX MICROSCOPIC
Glucose, UA: NEGATIVE mg/dL
Ketones, ur: >80 mg/dL — AB
LEUKOCYTES UA: NEGATIVE
Nitrite: NEGATIVE
PH: 6.5 (ref 5.0–8.0)
Protein, ur: NEGATIVE mg/dL
SPECIFIC GRAVITY, URINE: 1.02 (ref 1.005–1.030)
Urobilinogen, UA: 2 mg/dL — ABNORMAL HIGH (ref 0.0–1.0)

## 2014-05-16 LAB — URINE MICROSCOPIC-ADD ON

## 2014-05-16 MED ORDER — ONDANSETRON 8 MG PO TBDP
8.0000 mg | ORAL_TABLET | Freq: Three times a day (TID) | ORAL | Status: DC | PRN
Start: 2014-05-16 — End: 2014-06-12

## 2014-05-16 MED ORDER — ONDANSETRON 8 MG PO TBDP
8.0000 mg | ORAL_TABLET | Freq: Three times a day (TID) | ORAL | Status: DC | PRN
  Administered 2014-05-16: 8 mg via ORAL
  Filled 2014-05-16: qty 1

## 2014-05-16 NOTE — MAU Provider Note (Signed)
S: states feels better wants to go home. O: po's taken well and retaining, no diarrhea at present time A: gastroenteritis at 35.6 wks geastation P: D/C home with Rx for zofran.

## 2014-05-16 NOTE — MAU Note (Addendum)
Nausea and vomiting started 1000. Diarrhea X 2. Stomach hurting. States child has been sick.

## 2014-05-16 NOTE — MAU Provider Note (Signed)
History   G3P1101 @ 35.3 wks in with nausea, vomiting and diarrhea sice 1000 this morning. States daughter has had GI bug and she caught it from her. Gets care at [facility].  CSN: [id number]  Arrival date and time: 05/16/14 1711   First Provider Initiated Contact with Patient 05/16/14 1727      Chief Complaint  Patient presents with  . Emesis  . Diarrhea   HPI  OB History    Gravida Para Term Preterm AB TAB SAB Ectopic Multiple Living   Past Medical History  Diagnosis Date  . Medical history non-contributory   . Pregnant   . Rh negative state in antepartum period   . Vaginal Pap smear, abnormal     Past Surgical History  Procedure Laterality Date  . Tonsillectomy      Family History  Problem Relation Age of Onset  . Cancer Maternal Grandmother     breast  . Diabetes Paternal Grandfather     History  Substance Use Topics  . Smoking status: Former Games developer  . Smokeless tobacco: Never Used  . Alcohol Use: No    Allergies: No Known Allergies  Prescriptions prior to admission  Medication Sig Dispense Refill Last Dose  . acetaminophen (TYLENOL) 325 MG tablet Take 650 mg by mouth every 6 (six) hours as needed.   Taking  . Doxylamine-Pyridoxine 10-10 MG TBEC Take by mouth.   Not Taking  . ferrous sulfate 325 (65 FE) MG tablet Take 1 tablet (325 mg total) by mouth 2 (two) times daily with a meal. 60 tablet 3 Taking  . flintstones complete (FLINTSTONES) 60 MG chewable tablet Chew 2 tablets by mouth daily.   Taking  . ibuprofen (ADVIL,MOTRIN) 600 MG tablet Take 1 tablet (600 mg total) by mouth every 6 (six) hours as needed for mild pain, moderate pain or cramping. (Patient not taking: Reported on 02/09/2014) 30 tablet 1 Not Taking  . oseltamivir (TAMIFLU) 75 MG capsule Take 1 capsule (75 mg total) by mouth 2 (two) times daily. (Patient not taking: Reported on 02/09/2014) 5 capsule 0 Not Taking  . Prenat-FeFum-FePo-FA-Omega 3 (CONCEPT DHA) 53.5-38-1  MG CAPS Take 1 capsule by mouth daily. (Patient not taking: Reported on 02/09/2014) 30 capsule 11 Not Taking    Review of Systems  Constitutional: Positive for malaise/fatigue.  HENT: Negative.   Eyes: Negative.   Respiratory: Negative.   Cardiovascular: Negative.   Gastrointestinal: Positive for nausea, vomiting and diarrhea.  Genitourinary: Negative.   Musculoskeletal: Negative.   Skin: Negative.   Neurological: Negative.   Endo/Heme/Allergies: Negative.   Psychiatric/Behavioral: Negative.    Physical Exam   Blood pressure 120/71, pulse 119, temperature 98.4 F (36.9 C), temperature source Oral, resp. rate 18, last menstrual period 09/09/2013.  Physical Exam  Constitutional: She is oriented to person, place, and time. She appears well-developed and well-nourished.  HENT:  Head: Normocephalic.  Eyes: Pupils are equal, round, and reactive to light.  Cardiovascular: Normal rate, regular rhythm, normal heart sounds and intact distal pulses.   Respiratory: Effort normal and breath sounds normal.  GI: Soft. Bowel sounds are normal.  Musculoskeletal: Normal range of motion.  Neurological: She is alert and oriented to person, place, and time. She has normal reflexes.  Skin: Skin is warm and dry.  Psychiatric: She has a normal mood and affect. Her behavior is normal. Judgment and thought content normal.    MAU Course  Procedures  MDM Gastroenteritis  Assessment and Plan  Zofran ODT and introduce po fluids if tolerates  Will D/C home.  [name], Kyliah Deanda DARLENE 05/16/2014, 5:39 PM

## 2014-05-17 ENCOUNTER — Encounter: Payer: Medicaid Other | Admitting: Women's Health

## 2014-05-17 NOTE — Telephone Encounter (Signed)
Called pt and she states that after calling here yesterday she went on to the ED and was evaluated and is better today.

## 2014-05-19 ENCOUNTER — Ambulatory Visit (INDEPENDENT_AMBULATORY_CARE_PROVIDER_SITE_OTHER): Payer: Medicaid Other | Admitting: Obstetrics & Gynecology

## 2014-05-19 ENCOUNTER — Encounter: Payer: Self-pay | Admitting: Obstetrics & Gynecology

## 2014-05-19 VITALS — BP 120/80 | HR 76 | Wt 232.0 lb

## 2014-05-19 DIAGNOSIS — Z331 Pregnant state, incidental: Secondary | ICD-10-CM

## 2014-05-19 DIAGNOSIS — Z1389 Encounter for screening for other disorder: Secondary | ICD-10-CM

## 2014-05-19 DIAGNOSIS — Z3493 Encounter for supervision of normal pregnancy, unspecified, third trimester: Secondary | ICD-10-CM

## 2014-05-19 LAB — POCT URINALYSIS DIPSTICK
GLUCOSE UA: NEGATIVE
Ketones, UA: NEGATIVE
LEUKOCYTES UA: NEGATIVE
Nitrite, UA: NEGATIVE

## 2014-05-19 NOTE — Progress Notes (Signed)
[id number] 9458w0d Estimated Date of Delivery: 06/16/14  Blood pressure 120/80, pulse 76, weight 232 lb (105.235 kg), last menstrual period 09/09/2013.   BP weight and urine results all reviewed and noted.  Please refer to the obstetrical flow sheet for the fundal height and fetal heart rate documentation:  Patient reports good fetal movement, denies any bleeding and no rupture of membranes symptoms or regular contractions. Patient is without complaints. All questions were answered.  Plan:  Continued routine obstetrical care,   Follow up in 1 weeks for OB appointment,

## 2014-05-25 ENCOUNTER — Ambulatory Visit (INDEPENDENT_AMBULATORY_CARE_PROVIDER_SITE_OTHER): Payer: Medicaid Other | Admitting: Women's Health

## 2014-05-25 ENCOUNTER — Encounter: Payer: Self-pay | Admitting: Women's Health

## 2014-05-25 VITALS — BP 132/64 | HR 84 | Wt 232.0 lb

## 2014-05-25 DIAGNOSIS — Z3493 Encounter for supervision of normal pregnancy, unspecified, third trimester: Secondary | ICD-10-CM

## 2014-05-25 DIAGNOSIS — L292 Pruritus vulvae: Secondary | ICD-10-CM | POA: Diagnosis not present

## 2014-05-25 DIAGNOSIS — Z3685 Encounter for antenatal screening for Streptococcus B: Secondary | ICD-10-CM

## 2014-05-25 DIAGNOSIS — Z331 Pregnant state, incidental: Secondary | ICD-10-CM

## 2014-05-25 DIAGNOSIS — Z118 Encounter for screening for other infectious and parasitic diseases: Secondary | ICD-10-CM

## 2014-05-25 DIAGNOSIS — Z3483 Encounter for supervision of other normal pregnancy, third trimester: Secondary | ICD-10-CM

## 2014-05-25 DIAGNOSIS — Z1389 Encounter for screening for other disorder: Secondary | ICD-10-CM

## 2014-05-25 LAB — POCT WET PREP (WET MOUNT): Clue Cells Wet Prep Whiff POC: NEGATIVE

## 2014-05-25 LAB — POCT URINALYSIS DIPSTICK
GLUCOSE UA: NEGATIVE
Ketones, UA: NEGATIVE
Leukocytes, UA: NEGATIVE
Nitrite, UA: NEGATIVE
Protein, UA: NEGATIVE

## 2014-05-25 LAB — OB RESULTS CONSOLE GBS: GBS: NEGATIVE

## 2014-05-25 LAB — OB RESULTS CONSOLE GC/CHLAMYDIA
Chlamydia: NEGATIVE
GC PROBE AMP, GENITAL: NEGATIVE

## 2014-05-25 MED ORDER — TERCONAZOLE 0.4 % VA CREA: 1.0000 | TOPICAL_CREAM | Freq: Every day | VAGINAL | Status: DC

## 2014-05-25 NOTE — Progress Notes (Signed)
Low-risk OB appointment G3P1011 4752w6d Estimated Date of Delivery: 06/16/14 BP 132/64 mmHg  Pulse 84  Wt 232 lb (105.235 kg)  LMP 09/09/2013  BP, weight, and urine reviewed.  Refer to obstetrical flow sheet for FH & FHR.  Reports good fm.  Denies regular uc's, lof, vb, or uti s/s. Vulvar itching w/ white nonodorous d/c x 1wk.  Spec exam: cx visually closed, small amount thick white nondorous d/c, gbs collected, wet prep: few wbc's otherwise normal, will treat for yeast- rx terazole 7 SVE per request: 1.5/50/-2, vtx Reviewed labor s/s, fkc. Plan:  Continue routine obstetrical care  F/U in 1wk for OB appointment

## 2014-05-25 NOTE — Patient Instructions (Signed)
Call the office ([phone]) or go to [facility] if:  You begin to have strong, frequent contractions  Your water breaks.  Sometimes it is a big gush of fluid, sometimes it is just a trickle that keeps getting your panties wet or running down your legs  You have vaginal bleeding.  It is normal to have a small amount of spotting if your cervix was checked.   You don't feel your baby moving like normal.  If you don't, get you something to eat and drink and lay down and focus on feeling your baby move.  You should feel at least 10 movements in 2 hours.  If you don't, you should call the office or go to [facility].    Braxton Hicks Contractions Contractions of the uterus can occur throughout pregnancy. Contractions are not always a sign that you are in labor.  WHAT ARE BRAXTON HICKS CONTRACTIONS?  Contractions that occur before labor are called Braxton Hicks contractions, or false labor. Toward the end of pregnancy (32-34 weeks), these contractions can develop more often and may become more forceful. This is not true labor because these contractions do not result in opening (dilatation) and thinning of the cervix. They are sometimes difficult to tell apart from true labor because these contractions can be forceful and people have different pain tolerances. You should not feel embarrassed if you go to the hospital with false labor. Sometimes, the only way to tell if you are in true labor is for your health care provider to look for changes in the cervix. If there are no prenatal problems or other health problems associated with the pregnancy, it is completely safe to be sent home with false labor and await the onset of true labor. HOW CAN YOU TELL THE DIFFERENCE BETWEEN TRUE AND FALSE LABOR? False Labor  The contractions of false labor are usually shorter and not as hard as those of true labor.   The contractions are usually irregular.   The contractions are often felt in the front of  the lower abdomen and in the groin.   The contractions may go away when you walk around or change positions while lying down.   The contractions get weaker and are shorter lasting as time goes on.   The contractions do not usually become progressively stronger, regular, and closer together as with true labor.  True Labor  Contractions in true labor last 30-70 seconds, become very regular, usually become more intense, and increase in frequency.   The contractions do not go away with walking.   The discomfort is usually felt in the top of the uterus and spreads to the lower abdomen and low back.   True labor can be determined by your health care provider with an exam. This will show that the cervix is dilating and getting thinner.  WHAT TO REMEMBER  Keep up with your usual exercises and follow other instructions given by your health care provider.   Take medicines as directed by your health care provider.   Keep your regular prenatal appointments.   Eat and drink lightly if you think you are going into labor.   If Braxton Hicks contractions are making you uncomfortable:   Change your position from lying down or resting to walking, or from walking to resting.   Sit and rest in a tub of warm water.   Drink 2-3 glasses of water. Dehydration may cause these contractions.   Do slow and deep breathing several times an hour.    WHEN SHOULD I SEEK IMMEDIATE MEDICAL CARE? Seek immediate medical care if:  Your contractions become stronger, more regular, and closer together.   You have fluid leaking or gushing from your vagina.   You have a fever.   You pass blood-tinged mucus.   You have vaginal bleeding.   You have continuous abdominal pain.   You have low back pain that you never had before.   You feel your baby's head pushing down and causing pelvic pressure.   Your baby is not moving as much as it used to.  Document Released: 02/25/2005 Document  Revised: 03/02/2013 Document Reviewed: 12/07/2012 Exit[employer] Patient Information 2015 [employer]. This information is not intended to replace advice given to you by your health care provider. Make sure you discuss any questions you have with your health care provider.  

## 2014-05-26 LAB — GC/CHLAMYDIA PROBE AMP
Chlamydia trachomatis, NAA: NEGATIVE
NEISSERIA GONORRHOEAE BY PCR: NEGATIVE

## 2014-05-29 LAB — CULTURE, BETA STREP (GROUP B ONLY): STREP GP B CULTURE: NEGATIVE

## 2014-06-01 ENCOUNTER — Ambulatory Visit (INDEPENDENT_AMBULATORY_CARE_PROVIDER_SITE_OTHER): Payer: Medicaid Other | Admitting: Women's Health

## 2014-06-01 ENCOUNTER — Encounter: Payer: Medicaid Other | Admitting: Women's Health

## 2014-06-01 VITALS — BP 108/64 | HR 84 | Wt 235.0 lb

## 2014-06-01 DIAGNOSIS — Z3483 Encounter for supervision of other normal pregnancy, third trimester: Secondary | ICD-10-CM

## 2014-06-01 DIAGNOSIS — Z331 Pregnant state, incidental: Secondary | ICD-10-CM

## 2014-06-01 DIAGNOSIS — Z3493 Encounter for supervision of normal pregnancy, unspecified, third trimester: Secondary | ICD-10-CM

## 2014-06-01 DIAGNOSIS — Z1389 Encounter for screening for other disorder: Secondary | ICD-10-CM

## 2014-06-01 LAB — POCT URINALYSIS DIPSTICK
Blood, UA: 1
GLUCOSE UA: NEGATIVE
Ketones, UA: NEGATIVE
LEUKOCYTES UA: NEGATIVE
Nitrite, UA: NEGATIVE
Protein, UA: NEGATIVE

## 2014-06-01 NOTE — Progress Notes (Signed)
Low-risk OB appointment G3P1011 2745w6d Estimated Date of Delivery: 06/16/14 BP 108/64 mmHg  Pulse 84  Wt 235 lb (106.595 kg)  LMP 09/09/2013  BP, weight, and urine reviewed.  Refer to obstetrical flow sheet for FH & FHR.  Reports good fm.  Denies regular uc's, lof, vb, or uti s/s. No complaints. Feels like baby has dropped.  Declines SVE Reviewed labor s/s, fkc, gbs-. Plan:  Continue routine obstetrical care  F/U in 1wk for OB appointment

## 2014-06-01 NOTE — Patient Instructions (Signed)
Call the office ([phone]) or go to [facility] if:  You begin to have strong, frequent contractions  Your water breaks.  Sometimes it is a big gush of fluid, sometimes it is just a trickle that keeps getting your panties wet or running down your legs  You have vaginal bleeding.  It is normal to have a small amount of spotting if your cervix was checked.   You don't feel your baby moving like normal.  If you don't, get you something to eat and drink and lay down and focus on feeling your baby move.  You should feel at least 10 movements in 2 hours.  If you don't, you should call the office or go to [facility].    Braxton Hicks Contractions Contractions of the uterus can occur throughout pregnancy. Contractions are not always a sign that you are in labor.  WHAT ARE BRAXTON HICKS CONTRACTIONS?  Contractions that occur before labor are called Braxton Hicks contractions, or false labor. Toward the end of pregnancy (32-34 weeks), these contractions can develop more often and may become more forceful. This is not true labor because these contractions do not result in opening (dilatation) and thinning of the cervix. They are sometimes difficult to tell apart from true labor because these contractions can be forceful and people have different pain tolerances. You should not feel embarrassed if you go to the hospital with false labor. Sometimes, the only way to tell if you are in true labor is for your health care provider to look for changes in the cervix. If there are no prenatal problems or other health problems associated with the pregnancy, it is completely safe to be sent home with false labor and await the onset of true labor. HOW CAN YOU TELL THE DIFFERENCE BETWEEN TRUE AND FALSE LABOR? False Labor  The contractions of false labor are usually shorter and not as hard as those of true labor.   The contractions are usually irregular.   The contractions are often felt in the front of  the lower abdomen and in the groin.   The contractions may go away when you walk around or change positions while lying down.   The contractions get weaker and are shorter lasting as time goes on.   The contractions do not usually become progressively stronger, regular, and closer together as with true labor.  True Labor  Contractions in true labor last 30-70 seconds, become very regular, usually become more intense, and increase in frequency.   The contractions do not go away with walking.   The discomfort is usually felt in the top of the uterus and spreads to the lower abdomen and low back.   True labor can be determined by your health care provider with an exam. This will show that the cervix is dilating and getting thinner.  WHAT TO REMEMBER  Keep up with your usual exercises and follow other instructions given by your health care provider.   Take medicines as directed by your health care provider.   Keep your regular prenatal appointments.   Eat and drink lightly if you think you are going into labor.   If Braxton Hicks contractions are making you uncomfortable:   Change your position from lying down or resting to walking, or from walking to resting.   Sit and rest in a tub of warm water.   Drink 2-3 glasses of water. Dehydration may cause these contractions.   Do slow and deep breathing several times an hour.    WHEN SHOULD I SEEK IMMEDIATE MEDICAL CARE? Seek immediate medical care if:  Your contractions become stronger, more regular, and closer together.   You have fluid leaking or gushing from your vagina.   You have a fever.   You pass blood-tinged mucus.   You have vaginal bleeding.   You have continuous abdominal pain.   You have low back pain that you never had before.   You feel your baby's head pushing down and causing pelvic pressure.   Your baby is not moving as much as it used to.  Document Released: 02/25/2005 Document  Revised: 03/02/2013 Document Reviewed: 12/07/2012 Exit[employer] Patient Information 2015 [employer]. This information is not intended to replace advice given to you by your health care provider. Make sure you discuss any questions you have with your health care provider.  

## 2014-06-07 ENCOUNTER — Encounter: Payer: Medicaid Other | Admitting: Women's Health

## 2014-06-08 ENCOUNTER — Ambulatory Visit (INDEPENDENT_AMBULATORY_CARE_PROVIDER_SITE_OTHER): Payer: Medicaid Other | Admitting: Women's Health

## 2014-06-08 VITALS — BP 120/68 | HR 91 | Wt 234.0 lb

## 2014-06-08 DIAGNOSIS — Z3483 Encounter for supervision of other normal pregnancy, third trimester: Secondary | ICD-10-CM | POA: Diagnosis not present

## 2014-06-08 DIAGNOSIS — Z1389 Encounter for screening for other disorder: Secondary | ICD-10-CM

## 2014-06-08 DIAGNOSIS — Z331 Pregnant state, incidental: Secondary | ICD-10-CM

## 2014-06-08 LAB — POCT URINALYSIS DIPSTICK
GLUCOSE UA: NEGATIVE
Leukocytes, UA: NEGATIVE
Nitrite, UA: NEGATIVE

## 2014-06-08 NOTE — Progress Notes (Signed)
Low-risk OB appointment G3P1011 731w6d Estimated Date of Delivery: 06/16/14 BP 120/68 mmHg  Pulse 91  Wt 234 lb (106.142 kg)  LMP 09/09/2013  BP, weight, and urine reviewed.  Refer to obstetrical flow sheet for FH & FHR.  Reports good fm.  Denies regular uc's, lof, vb, or uti s/s. Pelvic discomfort.  SVE per request: 1.5/50/-2, vtx Reviewed labor s/s, fkc. Plan:  Continue routine obstetrical care  F/U in 1wk for OB appointment

## 2014-06-08 NOTE — Patient Instructions (Signed)
Call the office ([phone]) or go to [facility] if:  You begin to have strong, frequent contractions  Your water breaks.  Sometimes it is a big gush of fluid, sometimes it is just a trickle that keeps getting your panties wet or running down your legs  You have vaginal bleeding.  It is normal to have a small amount of spotting if your cervix was checked.   You don't feel your baby moving like normal.  If you don't, get you something to eat and drink and lay down and focus on feeling your baby move.  You should feel at least 10 movements in 2 hours.  If you don't, you should call the office or go to [facility].    Braxton Hicks Contractions Contractions of the uterus can occur throughout pregnancy. Contractions are not always a sign that you are in labor.  WHAT ARE BRAXTON HICKS CONTRACTIONS?  Contractions that occur before labor are called Braxton Hicks contractions, or false labor. Toward the end of pregnancy (32-34 weeks), these contractions can develop more often and may become more forceful. This is not true labor because these contractions do not result in opening (dilatation) and thinning of the cervix. They are sometimes difficult to tell apart from true labor because these contractions can be forceful and people have different pain tolerances. You should not feel embarrassed if you go to the hospital with false labor. Sometimes, the only way to tell if you are in true labor is for your health care provider to look for changes in the cervix. If there are no prenatal problems or other health problems associated with the pregnancy, it is completely safe to be sent home with false labor and await the onset of true labor. HOW CAN YOU TELL THE DIFFERENCE BETWEEN TRUE AND FALSE LABOR? False Labor  The contractions of false labor are usually shorter and not as hard as those of true labor.   The contractions are usually irregular.   The contractions are often felt in the front of  the lower abdomen and in the groin.   The contractions may go away when you walk around or change positions while lying down.   The contractions get weaker and are shorter lasting as time goes on.   The contractions do not usually become progressively stronger, regular, and closer together as with true labor.  True Labor  Contractions in true labor last 30-70 seconds, become very regular, usually become more intense, and increase in frequency.   The contractions do not go away with walking.   The discomfort is usually felt in the top of the uterus and spreads to the lower abdomen and low back.   True labor can be determined by your health care provider with an exam. This will show that the cervix is dilating and getting thinner.  WHAT TO REMEMBER  Keep up with your usual exercises and follow other instructions given by your health care provider.   Take medicines as directed by your health care provider.   Keep your regular prenatal appointments.   Eat and drink lightly if you think you are going into labor.   If Braxton Hicks contractions are making you uncomfortable:   Change your position from lying down or resting to walking, or from walking to resting.   Sit and rest in a tub of warm water.   Drink 2-3 glasses of water. Dehydration may cause these contractions.   Do slow and deep breathing several times an hour.    WHEN SHOULD I SEEK IMMEDIATE MEDICAL CARE? Seek immediate medical care if:  Your contractions become stronger, more regular, and closer together.   You have fluid leaking or gushing from your vagina.   You have a fever.   You pass blood-tinged mucus.   You have vaginal bleeding.   You have continuous abdominal pain.   You have low back pain that you never had before.   You feel your baby's head pushing down and causing pelvic pressure.   Your baby is not moving as much as it used to.  Document Released: 02/25/2005 Document  Revised: 03/02/2013 Document Reviewed: 12/07/2012 Exit[employer] Patient Information 2015 [employer]. This information is not intended to replace advice given to you by your health care provider. Make sure you discuss any questions you have with your health care provider.  

## 2014-06-10 ENCOUNTER — Inpatient Hospital Stay (HOSPITAL_COMMUNITY): Payer: Medicaid Other | Admitting: Anesthesiology

## 2014-06-10 ENCOUNTER — Inpatient Hospital Stay (HOSPITAL_COMMUNITY)
Admission: AD | Admit: 2014-06-10 | Discharge: 2014-06-12 | DRG: 775 | Disposition: A | Discharge status: Home / Self Care | Payer: Medicaid Other | Source: Ambulatory Visit | Attending: Obstetrics & Gynecology | Admitting: Obstetrics & Gynecology

## 2014-06-10 ENCOUNTER — Encounter (HOSPITAL_COMMUNITY): Payer: Self-pay | Admitting: *Deleted

## 2014-06-10 ENCOUNTER — Telehealth: Payer: Self-pay | Admitting: *Deleted

## 2014-06-10 DIAGNOSIS — Z3A39 39 weeks gestation of pregnancy: Secondary | ICD-10-CM | POA: Diagnosis present

## 2014-06-10 DIAGNOSIS — D649 Anemia, unspecified: Secondary | ICD-10-CM | POA: Diagnosis present

## 2014-06-10 DIAGNOSIS — Z833 Family history of diabetes mellitus: Secondary | ICD-10-CM

## 2014-06-10 DIAGNOSIS — IMO0001 Reserved for inherently not codable concepts without codable children: Secondary | ICD-10-CM

## 2014-06-10 DIAGNOSIS — Z87891 Personal history of nicotine dependence: Secondary | ICD-10-CM | POA: Diagnosis not present

## 2014-06-10 DIAGNOSIS — O9902 Anemia complicating childbirth: Secondary | ICD-10-CM | POA: Diagnosis present

## 2014-06-10 DIAGNOSIS — O26893 Other specified pregnancy related conditions, third trimester: Secondary | ICD-10-CM | POA: Diagnosis present

## 2014-06-10 DIAGNOSIS — Z6791 Unspecified blood type, Rh negative: Secondary | ICD-10-CM | POA: Diagnosis not present

## 2014-06-10 DIAGNOSIS — O9989 Other specified diseases and conditions complicating pregnancy, childbirth and the puerperium: Secondary | ICD-10-CM | POA: Diagnosis present

## 2014-06-10 LAB — CBC
HCT: 37.1 % (ref 36.0–46.0)
Hemoglobin: 12.1 g/dL (ref 12.0–15.0)
MCH: 26.6 pg (ref 26.0–34.0)
MCHC: 32.6 g/dL (ref 30.0–36.0)
MCV: 81.5 fL (ref 78.0–100.0)
Platelets: 168 10*3/uL (ref 150–400)
RBC: 4.55 MIL/uL (ref 3.87–5.11)
RDW: 17.6 % — ABNORMAL HIGH (ref 11.5–15.5)
WBC: 7.2 10*3/uL (ref 4.0–10.5)

## 2014-06-10 LAB — POCT FERN TEST

## 2014-06-10 MED ORDER — BUPIVACAINE HCL (PF) 0.25 % IJ SOLN
INTRAMUSCULAR | Status: DC | PRN
  Administered 2014-06-10: 4 mL
  Administered 2014-06-10: 8 mL via EPIDURAL

## 2014-06-10 MED ORDER — LACTATED RINGERS IV SOLN: 500.0000 mL | INTRAVENOUS | Status: DC | PRN

## 2014-06-10 MED ORDER — DIPHENHYDRAMINE HCL 50 MG/ML IJ SOLN
12.5000 mg | INTRAMUSCULAR | Status: DC | PRN
Start: 2014-06-10 — End: 2014-06-11

## 2014-06-10 MED ORDER — OXYTOCIN 40 UNITS IN LACTATED RINGERS INFUSION - SIMPLE MED
1.0000 m[IU]/min | INTRAVENOUS | Status: DC
  Administered 2014-06-10: 2 m[IU]/min via INTRAVENOUS
  Filled 2014-06-10: qty 1000

## 2014-06-10 MED ORDER — PHENYLEPHRINE 40 MCG/ML (10ML) SYRINGE FOR IV PUSH (FOR BLOOD PRESSURE SUPPORT)
80.0000 ug | PREFILLED_SYRINGE | INTRAVENOUS | Status: DC | PRN
  Filled 2014-06-10 (×2): qty 20
  Filled 2014-06-10: qty 2

## 2014-06-10 MED ORDER — LACTATED RINGERS IV SOLN
INTRAVENOUS | Status: DC
  Administered 2014-06-10: 125 mL/h via INTRAVENOUS
  Administered 2014-06-10 (×2): via INTRAVENOUS

## 2014-06-10 MED ORDER — ACETAMINOPHEN 325 MG PO TABS: 650.0000 mg | ORAL_TABLET | ORAL | Status: DC | PRN

## 2014-06-10 MED ORDER — FLEET ENEMA 7-19 GM/118ML RE ENEM: 1.0000 | ENEMA | RECTAL | Status: DC | PRN

## 2014-06-10 MED ORDER — LIDOCAINE HCL (PF) 1 % IJ SOLN
INTRAMUSCULAR | Status: DC | PRN
  Administered 2014-06-10: 4 mL
  Administered 2014-06-10: 6 mL

## 2014-06-10 MED ORDER — ONDANSETRON HCL 4 MG/2ML IJ SOLN
4.0000 mg | Freq: Four times a day (QID) | INTRAMUSCULAR | Status: DC | PRN
  Administered 2014-06-10: 4 mg via INTRAVENOUS
  Filled 2014-06-10: qty 2

## 2014-06-10 MED ORDER — OXYCODONE-ACETAMINOPHEN 5-325 MG PO TABS
1.0000 | ORAL_TABLET | ORAL | Status: DC | PRN
  Administered 2014-06-11 – 2014-06-12 (×3): 1 via ORAL
  Filled 2014-06-10 (×3): qty 1

## 2014-06-10 MED ORDER — BUTORPHANOL TARTRATE 1 MG/ML IJ SOLN
2.0000 mg | INTRAMUSCULAR | Status: DC | PRN
  Administered 2014-06-10: 2 mg via INTRAVENOUS
  Filled 2014-06-10: qty 2

## 2014-06-10 MED ORDER — LIDOCAINE HCL (PF) 1 % IJ SOLN
30.0000 mL | INTRAMUSCULAR | Status: DC | PRN
  Filled 2014-06-10: qty 30

## 2014-06-10 MED ORDER — FENTANYL CITRATE 0.05 MG/ML IJ SOLN
INTRAMUSCULAR | Status: AC
  Filled 2014-06-10: qty 2

## 2014-06-10 MED ORDER — OXYCODONE-ACETAMINOPHEN 5-325 MG PO TABS: 1.0000 | ORAL_TABLET | ORAL | Status: DC | PRN

## 2014-06-10 MED ORDER — TERBUTALINE SULFATE 1 MG/ML IJ SOLN: 0.2500 mg | Freq: Once | INTRAMUSCULAR | Status: AC | PRN

## 2014-06-10 MED ORDER — EPHEDRINE 5 MG/ML INJ
10.0000 mg | INTRAVENOUS | Status: DC | PRN
  Filled 2014-06-10: qty 2

## 2014-06-10 MED ORDER — IBUPROFEN 600 MG PO TABS
600.0000 mg | ORAL_TABLET | Freq: Four times a day (QID) | ORAL | Status: DC
  Administered 2014-06-11 – 2014-06-12 (×6): 600 mg via ORAL
  Filled 2014-06-10 (×6): qty 1

## 2014-06-10 MED ORDER — SODIUM BICARBONATE 8.4 % IV SOLN
INTRAVENOUS | Status: DC | PRN
  Administered 2014-06-10: 3 mL via EPIDURAL

## 2014-06-10 MED ORDER — CITRIC ACID-SODIUM CITRATE 334-500 MG/5ML PO SOLN: 30.0000 mL | ORAL | Status: DC | PRN

## 2014-06-10 MED ORDER — LACTATED RINGERS IV SOLN: 500.0000 mL | Freq: Once | INTRAVENOUS | Status: DC

## 2014-06-10 MED ORDER — OXYCODONE-ACETAMINOPHEN 5-325 MG PO TABS: 2.0000 | ORAL_TABLET | ORAL | Status: DC | PRN

## 2014-06-10 MED ORDER — FENTANYL CITRATE 0.05 MG/ML IJ SOLN
INTRAMUSCULAR | Status: DC | PRN
  Administered 2014-06-10: 100 ug via EPIDURAL

## 2014-06-10 MED ORDER — OXYTOCIN BOLUS FROM INFUSION
500.0000 mL | INTRAVENOUS | Status: DC
Start: 2014-06-10 — End: 2014-06-11

## 2014-06-10 MED ORDER — OXYTOCIN 40 UNITS IN LACTATED RINGERS INFUSION - SIMPLE MED: 62.5000 mL/h | INTRAVENOUS | Status: DC

## 2014-06-10 MED ORDER — PHENYLEPHRINE 40 MCG/ML (10ML) SYRINGE FOR IV PUSH (FOR BLOOD PRESSURE SUPPORT)
80.0000 ug | PREFILLED_SYRINGE | INTRAVENOUS | Status: DC | PRN
  Filled 2014-06-10: qty 2

## 2014-06-10 MED ORDER — FENTANYL 2.5 MCG/ML BUPIVACAINE 1/10 % EPIDURAL INFUSION (WH - ANES)
14.0000 mL/h | INTRAMUSCULAR | Status: DC | PRN
  Administered 2014-06-10 (×2): 14 mL/h via EPIDURAL
  Filled 2014-06-10 (×2): qty 125

## 2014-06-10 NOTE — MAU Note (Signed)
Pt states here for SROM at 0830, clear fluid.

## 2014-06-10 NOTE — H&P (Signed)
LABOR ADMISSION HISTORY AND PHYSICAL  Kristina [name] is a 24 y.o. female G3P1011 with IUP at 5770w1d by U/S  presenting for SROM, clear fluid @ 830am. She reports +FMs, no VB, no blurry vision, headaches or peripheral edema, and RUQ pain.  She plans on breast feeding. She request Mirena IUDfor birth control.    Estimated Date of Delivery: 06/16/14   Prenatal History/Complications:  Past Medical History: Past Medical History  Diagnosis Date  . Medical history non-contributory   . Pregnant   . Rh negative state in antepartum period   . Vaginal Pap smear, abnormal     Past Surgical History: Past Surgical History  Procedure Laterality Date  . Tonsillectomy      Obstetrical History: OB History    Gravida Para Term Preterm AB TAB SAB Ectopic Multiple Living   3 1 1  1  1   1       Social History: History   Social History  . Marital Status: Single    Spouse Name: N/A  . Number of Children: N/A  . Years of Education: N/A   Social History Main Topics  . Smoking status: Former Games developermoker  . Smokeless tobacco: Never Used  . Alcohol Use: No  . Drug Use: No  . Sexual Activity: Yes    Birth Control/ Protection: None   Other Topics Concern  . None   Social History Narrative    Family History: Family History  Problem Relation Age of Onset  . Cancer Maternal Grandmother     breast  . Diabetes Paternal Grandfather     Allergies: No Known Allergies  Prescriptions prior to admission  Medication Sig Dispense Refill Last Dose  . ferrous sulfate 325 (65 FE) MG tablet Take 1 tablet (325 mg total) by mouth 2 (two) times daily with a meal. 60 tablet 3 06/09/2014 at Unknown time  . flintstones complete (FLINTSTONES) 60 MG chewable tablet Chew 2 tablets by mouth daily.   06/09/2014 at Unknown time  . acetaminophen (TYLENOL) 325 MG tablet Take 650 mg by mouth every 6 (six) hours as needed for headache.    06/03/2014  . ibuprofen (ADVIL,MOTRIN) 600 MG tablet Take 1 tablet (600 mg total) by  mouth every 6 (six) hours as needed for mild pain, moderate pain or cramping. (Patient not taking: Reported on 02/09/2014) 30 tablet 1 Not Taking  . ondansetron (ZOFRAN-ODT) 8 MG disintegrating tablet Take 1 tablet (8 mg total) by mouth every 8 (eight) hours as needed for nausea or vomiting. (Patient not taking: Reported on 05/19/2014) 20 tablet 0 Not Taking  . oseltamivir (TAMIFLU) 75 MG capsule Take 1 capsule (75 mg total) by mouth 2 (two) times daily. (Patient not taking: Reported on 02/09/2014) 5 capsule 0 Not Taking  . Prenat-FeFum-FePo-FA-Omega 3 (CONCEPT DHA) 53.5-38-1 MG CAPS Take 1 capsule by mouth daily. (Patient not taking: Reported on 06/08/2014) 30 capsule 11 Not Taking  . terconazole (TERAZOL 7) 0.4 % vaginal cream Place 1 applicator vaginally at bedtime. (Patient not taking: Reported on 06/08/2014) 45 g 0 Not Taking     Review of Systems   All systems reviewed and negative except as stated in HPI  Blood pressure 128/89, pulse 86, temperature 97.8 F (36.6 C), temperature source Oral, resp. rate 18, height 5\' 8"  (1.727 m), weight 106.142 kg (234 lb), last menstrual period 09/09/2013. General appearance: alert and cooperative Lungs: clear to auscultation bilaterally Heart: regular rate and rhythm Abdomen: soft, non-tender; bowel sounds normal Pelvic: wnl Extremities: Denna HaggardHomans  sign is negative, no sign of DVT DTR's +2 Presentation: cephalic Fetal monitoringBaseline: 125 bpm, Variability: Good {> 6 bpm) and Accelerations: Reactive Uterine activity: irregular, mild  Dilation: 2.5 Effacement (%): 50 Station: -2 Exam by:: Doreatha Massed, RNC  [facility] Prenatal Labs  Dating [place] Blood type: A/NEG/-- (09/01 1139)   Genetic Screen 1 Screen:  wnl  AFP:     Quad:     NIPS: Antibody:NEG (01/06 0906)  Anatomic US wnl Rubella: 3.37 (09/01 1139)  GTT Early:               Third trimester:  RPR: NON REAC (01/06 0906)   Flu vaccine  HBsAg: NEGATIVE (09/01 1139)   TDaP vaccine                                                Rhogam: HIV: NONREACTIVE (01/06 0906)   GBS          neg                                    (For PCN allergy, check sensitivities) GBS: Negative  Contraception Mirena Pap:  Baby Food Breastfeeding   Circumcision Yes   Pediatrician Procedure [facility]   Support Person Husband    Results for orders placed or performed during the hospital encounter of 06/10/14 (from the past 24 hour(s))  Fern Test     Status: Abnormal   Collection Time: 06/10/14  9:29 AM  Result Value Ref Range   POCT Fern Test    CBC     Status: Abnormal   Collection Time: 06/10/14 10:30 AM  Result Value Ref Range   WBC 7.2 4.0 - 10.5 K/uL   RBC 4.55 3.87 - 5.11 MIL/uL   Hemoglobin 12.1 12.0 - 15.0 g/dL   HCT 40.9 81.1 - 91.4 %   MCV 81.5 78.0 - 100.0 fL   MCH 26.6 26.0 - 34.0 pg   MCHC 32.6 30.0 - 36.0 g/dL   RDW 78.2 (H) 95.6 - 21.3 %   Platelets 168 150 - 400 K/uL     Prenatal labs: ABO, Rh: A/NEG/-- (09/01 1139) Antibody: NEG (01/06 0906) Rubella:   RPR: NON REAC (01/06 0906)  HBsAg: NEGATIVE (09/01 1139)  HIV: NONREACTIVE (01/06 0906)  GBS:   negative   Prenatal Transfer Tool  Maternal Diabetes: No Genetic Screening: Normal Maternal Ultrasounds/Referrals: Normal Fetal Ultrasounds or other Referrals:  None Maternal Substance Abuse:  No Significant Maternal Medications:  None Significant Maternal Lab Results: None  Results for orders placed or performed during the hospital encounter of 06/10/14 (from the past 24 hour(s))  Fern Test   Collection Time: 06/10/14  9:29 AM  Result Value Ref Range   POCT Fern Test    CBC   Collection Time: 06/10/14 10:30 AM  Result Value Ref Range   WBC 7.2 4.0 - 10.5 K/uL   RBC 4.55 3.87 - 5.11 MIL/uL   Hemoglobin 12.1 12.0 - 15.0 g/dL   HCT 08.6 57.8 - 46.9 %   MCV 81.5 78.0 - 100.0 fL   MCH 26.6 26.0 - 34.0 pg   MCHC 32.6 30.0 - 36.0 g/dL   RDW 62.9 (H) 52.8 - 41.3 %   Platelets 168 150 - 400 K/uL  Patient Active Problem List   Diagnosis Date Noted  . Active labor at term 06/10/2014  . Anemia affecting pregnancy in third trimester, antepartum 03/21/2014  . History of early 2nd trimester loss, currently pregnant 01/26/2014  . Supervision of other normal pregnancy 11/09/2013  . Rh negative state in antepartum period 11/09/2013  . Abnormal Pap smear of cervix, LSIL 04/06/2013    Assessment: SHIRLEE WHITMIRE is a 24 y.o. G3P1011 at [redacted]w[redacted]d here for SROM   Plan:  IV pain meds for pain relief Augment with Pitocin Anticipate Vaginal Delivery   Illene Bolus CNM  06/10/2014, 11:03 AM

## 2014-06-10 NOTE — Progress Notes (Signed)
LABOR PROGRESS NOTE  Despina HiddenJanie K Reid is a 24 y.o. G3P1011 at 2840w1d admitted for SROM.  Subjective: Patient resting comfortably on epidural.  No concerns at this time.  Objective: BP 123/76 mmHg  Pulse 97  Temp(Src) 97.7 F (36.5 C) (Oral)  Resp 18  Ht 5\' 8"  (1.727 m)  Wt 234 lb (106.142 kg)  BMI 35.59 kg/m2  SpO2 100%  LMP 09/09/2013 or  Filed Vitals:   06/10/14 1755 06/10/14 1800 06/10/14 1805 06/10/14 1810  BP:  123/76    Pulse: 72 76 96 97  Temp:  97.7 F (36.5 C)    TempSrc:  Oral    Resp:  18    Height:      Weight:      SpO2: 100% 100% 100% 100%   Fetal monitoringBaseline: 125 bpm, Variability: Good {> 6 bpm), Accelerations: Reactive and early Decelerations: Variable: mild Uterine activity Frequency: Every 2-3 minutes  Dilation: 4.5 Effacement (%): 70 Station: -1 Presentation: Vertex Exam by:: Doreatha MassedF. Morris, RNC  Labs: Lab Results  Component Value Date   WBC 7.2 06/10/2014   HGB 12.1 06/10/2014   HCT 37.1 06/10/2014   MCV 81.5 06/10/2014   PLT 168 06/10/2014    Patient Active Problem List   Diagnosis Date Noted  . Active labor at term 06/10/2014  . Anemia affecting pregnancy in third trimester, antepartum 03/21/2014  . History of early 2nd trimester loss, currently pregnant 01/26/2014  . Supervision of other normal pregnancy 11/09/2013  . Rh negative state in antepartum period 11/09/2013  . Abnormal Pap smear of cervix, LSIL 04/06/2013    Assessment / Plan: 24 y.o. G3P1011 at 7640w1d here for SROM.  Labor: Labor progressing well on pitocin Fetal Wellbeing:  Category 1 Pain Control:  Epidural Anticipated MOD:  NSVD  Raliegh IpGottschalk, Jazalyn Mondor M, DO PGY-1, [facility] 06/10/2014, 6:33 PM

## 2014-06-10 NOTE — Progress Notes (Signed)
Kristina [name] is a 24 y.o. G3P1011 at 3259w1d   Subjective: Comfortable with epidural; starting to feel some pressure in lower abdomen  Objective: BP 107/81 mmHg  Pulse 98  Temp(Src) 97.7 F (36.5 C) (Oral)  Resp 18  Ht 5\' 8"  (1.727 m)  Wt 106.142 kg (234 lb)  BMI 35.59 kg/m2  SpO2 100%  LMP 09/09/2013      FHT: 125 Baseline; early decels; reassurring UC:   IUPc placed during vaginal exam SVE:   Dilation: 5 Effacement (%): 70 Station: -1 Exam by:: L. Clemmons, CNM  Labs: Lab Results  Component Value Date   WBC 7.2 06/10/2014   HGB 12.1 06/10/2014   HCT 37.1 06/10/2014   MCV 81.5 06/10/2014   PLT 168 06/10/2014    Assessment / Plan: IUP @ 39+1   Labor: iupc placed  Preeclampsia:  na Fetal Wellbeing:  Category I Pain Control:  Epidural I/D:  n/a Anticipated MOD:  NSVD  Clemmons,Lori Grissett 06/10/2014, 7:37 PM

## 2014-06-10 NOTE — Anesthesia Preprocedure Evaluation (Signed)
Anesthesia Evaluation    Airway        Dental   Pulmonary former smoker,          Cardiovascular     Neuro/Psych    GI/Hepatic   Endo/Other    Renal/GU      Musculoskeletal   Abdominal   Peds  Hematology   Anesthesia Other Findings   Reproductive/Obstetrics                             Anesthesia Physical Anesthesia Plan  ASA: II  Anesthesia Plan: Epidural   Post-op Pain Management:    Induction:   Airway Management Planned:   Additional Equipment:   Intra-op Plan:   Post-operative Plan:   Informed Consent: I have reviewed the patients History and Physical, chart, labs and discussed the procedure including the risks, benefits and alternatives for the proposed anesthesia with the patient or authorized representative who has indicated his/her understanding and acceptance.   Dental Advisory Given  Plan Discussed with:   Anesthesia Plan Comments: (Labs checked- platelets confirmed with RN in room. Fetal heart tracing, per RN, reported to be stable enough for sitting procedure. Discussed epidural, and patient consents to the procedure:  included risk of possible headache,backache, failed block, allergic reaction, and nerve injury. This patient was asked if she had any questions or concerns before the procedure started.)        Anesthesia Quick Evaluation

## 2014-06-10 NOTE — Telephone Encounter (Signed)
Randi with [employer] After line phone service, states pt called this am with c/o gush of fluids, mild cramping, no bleeding, and was sent to MAU for evaluation.

## 2014-06-10 NOTE — Progress Notes (Signed)
Kristina [name] is a 24 y.o. G3P1011 at 579w1d by ultrasound admitted for rupture of membranes  Subjective:   Objective: BP 132/73 mmHg  Pulse 85  Temp(Src) 97.6 F (36.4 C) (Oral)  Resp 20  Ht 5\' 8"  (1.727 m)  Wt 106.142 kg (234 lb)  BMI 35.59 kg/m2  LMP 09/09/2013      FHT:  FHR: 125 bpm, variability: moderate,  accelerations:  Present,  decelerations:  Absent UC:   regular, every 2 minutes SVE:   Dilation: 2.5 Effacement (%): 70 Station: -1 Exam by:: L. Clemmons, CNM  Labs: Lab Results  Component Value Date   WBC 7.2 06/10/2014   HGB 12.1 06/10/2014   HCT 37.1 06/10/2014   MCV 81.5 06/10/2014   PLT 168 06/10/2014    Assessment / Plan: Augmentation of labor, progressing well  Labor: Progressing normally on Pitocin Preeclampsia:  n/a Fetal Wellbeing:  Category I Pain Control:  stadol I/D:  n/a Anticipated MOD:  NSVD  Clemmons,Lori Grissett 06/10/2014, 2:14 PM

## 2014-06-10 NOTE — Progress Notes (Signed)
Epidural redosed 

## 2014-06-10 NOTE — Anesthesia Procedure Notes (Signed)

## 2014-06-11 LAB — TYPE AND SCREEN
ABO/RH(D): A NEG
Antibody Screen: POSITIVE
DAT, IgG: NEGATIVE
Unit division: 0
Unit division: 0

## 2014-06-11 LAB — HIV ANTIBODY (ROUTINE TESTING W REFLEX): HIV Screen 4th Generation wRfx: NONREACTIVE

## 2014-06-11 LAB — RPR: RPR Ser Ql: NONREACTIVE

## 2014-06-11 MED ORDER — DIPHENHYDRAMINE HCL 25 MG PO CAPS: 25.0000 mg | ORAL_CAPSULE | Freq: Four times a day (QID) | ORAL | Status: DC | PRN

## 2014-06-11 MED ORDER — WITCH HAZEL-GLYCERIN EX PADS: 1.0000 "application " | MEDICATED_PAD | CUTANEOUS | Status: DC | PRN

## 2014-06-11 MED ORDER — LANOLIN HYDROUS EX OINT: TOPICAL_OINTMENT | CUTANEOUS | Status: DC | PRN

## 2014-06-11 MED ORDER — PRENATAL MULTIVITAMIN CH
1.0000 | ORAL_TABLET | Freq: Every day | ORAL | Status: DC
  Administered 2014-06-11 – 2014-06-12 (×2): 1 via ORAL
  Filled 2014-06-11 (×2): qty 1

## 2014-06-11 MED ORDER — SENNOSIDES-DOCUSATE SODIUM 8.6-50 MG PO TABS
2.0000 | ORAL_TABLET | ORAL | Status: DC
  Administered 2014-06-11: 2 via ORAL
  Filled 2014-06-11: qty 2

## 2014-06-11 MED ORDER — BENZOCAINE-MENTHOL 20-0.5 % EX AERO: 1.0000 "application " | INHALATION_SPRAY | CUTANEOUS | Status: DC | PRN

## 2014-06-11 MED ORDER — DIBUCAINE 1 % RE OINT: 1.0000 "application " | TOPICAL_OINTMENT | RECTAL | Status: DC | PRN

## 2014-06-11 MED ORDER — SIMETHICONE 80 MG PO CHEW
80.0000 mg | CHEWABLE_TABLET | ORAL | Status: DC | PRN
  Administered 2014-06-11: 80 mg via ORAL
  Filled 2014-06-11: qty 1

## 2014-06-11 MED ORDER — ZOLPIDEM TARTRATE 5 MG PO TABS: 5.0000 mg | ORAL_TABLET | Freq: Every evening | ORAL | Status: DC | PRN

## 2014-06-11 MED ORDER — ACETAMINOPHEN 325 MG PO TABS: 650.0000 mg | ORAL_TABLET | ORAL | Status: DC | PRN

## 2014-06-11 MED ORDER — ONDANSETRON HCL 4 MG/2ML IJ SOLN: 4.0000 mg | INTRAMUSCULAR | Status: DC | PRN

## 2014-06-11 MED ORDER — TETANUS-DIPHTH-ACELL PERTUSSIS 5-2.5-18.5 LF-MCG/0.5 IM SUSP: 0.5000 mL | Freq: Once | INTRAMUSCULAR | Status: DC

## 2014-06-11 MED ORDER — RHO D IMMUNE GLOBULIN 1500 UNIT/2ML IJ SOSY
300.0000 ug | PREFILLED_SYRINGE | Freq: Once | INTRAMUSCULAR | Status: AC
  Administered 2014-06-11: 300 ug via INTRAMUSCULAR
  Filled 2014-06-11: qty 2

## 2014-06-11 MED ORDER — ONDANSETRON HCL 4 MG PO TABS: 4.0000 mg | ORAL_TABLET | ORAL | Status: DC | PRN

## 2014-06-11 NOTE — Progress Notes (Signed)
Post Partum Day 1 Subjective: no complaints, up ad lib, voiding and tolerating PO  Objective: Blood pressure 126/72, pulse 89, temperature 98.2 F (36.8 C), temperature source Oral, resp. rate 16, height 5\' 8"  (1.727 m), weight 106.142 kg (234 lb), last menstrual period 09/09/2013, SpO2 99 %, unknown if currently breastfeeding.  Physical Exam:  General: alert, cooperative and no distress Lochia: appropriate Uterine Fundus: firm Incision: healing well DVT Evaluation: No evidence of DVT seen on physical exam. No cords or calf tenderness. No significant calf/ankle edema.   Recent Labs  06/10/14 1030  HGB 12.1  HCT 37.1    Assessment/Plan: Plan for discharge tomorrow, Breastfeeding, Circumcision prior to discharge and Contraception IUD   LOS: 1 day   Beverely Lowdamo, Elena 06/11/2014, 7:51 AM   I have seen and examined this patient and I agree with the above. Cam HaiSHAW, KIMBERLY CNM 8:36 AM 06/11/2014

## 2014-06-11 NOTE — Lactation Note (Signed)
This note was copied from the chart of Kristina [name]. Mom states "I do not want to breast feed anymore.  I tried it."  She had made the statement that she "wasn't sure she wants to do this".  Encouragement given.  When she stated she did not want to breast feed and that she wanted to formula feed like she did her other child, I let her know that if at any point she wanted to give breast feeding another try to let us know and we can help her with that.  Formula information and measuring cups given.

## 2014-06-11 NOTE — Lactation Note (Signed)
This note was copied from the chart of Boy [place] ReaderJanie Reid. Lactation Consultation Note  Patient Name: Boy [place] ReaderJanie Reid ZOXWR'UToday's Date: 06/11/2014 Reason for consult: Initial assessment Mom c/o of nipple tenderness and that baby is not latching well. Moved Mom to side lying position and baby latched with minimal assist. Mom reported discomfort with initial latch that improved as the baby was nursing. Mom did not BF her 1st child and LC unsure of commitment with this baby. Basic teaching reviewed with Mom. No breakdown noted on nipples, advised to apply EBM. Lactation brochure left for review, advised of OP services and support group. Encouraged to call for assist with latch.   Maternal Data Has patient been taught Hand Expression?: Yes Does the patient have breastfeeding experience prior to this delivery?: No  Feeding Feeding Type: Breast Fed Length of feed: 5 min  LATCH Score/Interventions Latch: Grasps breast easily, tongue down, lips flanged, rhythmical sucking. Intervention(s): Adjust position;Assist with latch;Breast massage;Breast compression  Audible Swallowing: A few with stimulation Intervention(s): Skin to skin;Hand expression  Type of Nipple: Everted at rest and after stimulation  Comfort (Breast/Nipple): Filling, red/small blisters or bruises, mild/mod discomfort  Problem noted: Mild/Moderate discomfort Interventions (Mild/moderate discomfort): Hand massage;Hand expression  Hold (Positioning): Assistance needed to correctly position infant at breast and maintain latch. Intervention(s): Breastfeeding basics reviewed;Support Pillows;Position options;Skin to skin  LATCH Score: 7  Lactation Tools Discussed/Used WIC Program: No   Consult Status Consult Status: Follow-up Date: 06/12/14 Follow-up type: In-patient    Alfred LevinsGranger, Benecio Kluger Ann 06/11/2014, 9:56 PM

## 2014-06-11 NOTE — Anesthesia Postprocedure Evaluation (Signed)
Anesthesia Post Note  Patient: Kristina [name]  Procedure(s) Performed: * No procedures listed *  Anesthesia type: Epidural  Patient location: Mother/Baby  Post pain: Pain level controlled  Post assessment: Post-op Vital signs reviewed  Last Vitals:  Filed Vitals:   06/11/14 1330  BP: 130/71  Pulse: 80  Temp: 37.1 C  Resp: 18    Post vital signs: Reviewed  Level of consciousness:alert  Complications: No apparent anesthesia complications

## 2014-06-12 LAB — RH IG WORKUP (INCLUDES ABO/RH)
ABO/RH(D): A NEG
Fetal Screen: NEGATIVE
Gestational Age(Wks): 39.1
UNIT DIVISION: 0

## 2014-06-12 MED ORDER — OXYCODONE-ACETAMINOPHEN 5-325 MG PO TABS: 1.0000 | ORAL_TABLET | ORAL | Status: DC | PRN

## 2014-06-12 NOTE — Discharge Summary (Signed)
Obstetric Discharge Summary Reason for Admission: onset of labor Prenatal Procedures: ultrasound Intrapartum Procedures: spontaneous vaginal delivery Postpartum Procedures: none Complications-Operative and Postpartum: none HEMOGLOBIN  Date Value Ref Range Status  06/10/2014 12.1 12.0 - 15.0 g/dL Final   HCT  Date Value Ref Range Status  06/10/2014 37.1 36.0 - 46.0 % Final    Physical Exam:  General: alert, cooperative, appears stated age and no distress Lochia: appropriate Uterine Fundus: firm Incision: n/a DVT Evaluation: No evidence of DVT seen on physical exam. Negative Homan's sign. No cords or calf tenderness. No significant calf/ankle edema.  Discharge Diagnoses: Term Pregnancy-delivered  Discharge Information: Date: 06/12/2014 Activity: pelvic rest Diet: routine Medications: PNV, Ibuprofen and Percocet Condition: stable and improved Instructions: refer to practice specific booklet Discharge to: home   Newborn Data: Live born female  Birth Weight: 8 lb 1.5 oz (3670 g) APGAR: 8, 9  Home with mother.  Kristina [name], Kristina [name] 06/12/2014, 8:41 AM

## 2014-06-12 NOTE — Discharge Summary (Signed)
Obstetric Discharge Summary Reason for Admission: rupture of membranes Prenatal Procedures: none Intrapartum Procedures: spontaneous vaginal delivery Postpartum Procedures: none Complications-Operative and Postpartum: none  At 9:59 PM a viable and healthy female was delivered via Vaginal, Spontaneous Delivery (Presentation: Right Occiput Anterior). APGAR: 8, 9; weight .  Placenta status: Intact, Spontaneous. Cord: 3 vessels with the following complications: Knot.   Anesthesia: Epidural  Episiotomy: None Lacerations: None Suture Repair: n/a Est. Blood Loss (mL): 300  Mom to postpartum. Baby to Couplet care / Skin to Skin. Vigorous female infant delivered on the first push over intact perineum. Immediate cry. Placed on maternal abdomen. Cord clamped and cut. Placenta delivered quickly without complications. Knot in distal portion of cord noted with good blood flow throughout it as evidenced by pulsating cord.  Hospital Course:  Active Problems:   Active labor at term   Kristina [name] is a 24 y.o. [id number] s/p SVD.  Patient was admitted for SROM.  She has postpartum course that was uncomplicated including no problems with ambulating, PO intake, urination, pain, or bleeding. The pt feels ready to go home and  will be discharged with outpatient follow-up.   Today: No acute events overnight.  Pt denies problems with ambulating, voiding or po intake.  She denies nausea or vomiting.  Pain is well controlled.  She has had flatus. She has had bowel movement.  Lochia Minimal.  Plan for birth control is  IUD.  Method of Feeding: Bottle   Physical Exam:  General: alert, cooperative and no distress Lochia: appropriate Uterine Fundus: firm DVT Evaluation: No evidence of DVT seen on physical exam.  H/H: Lab Results  Component Value Date/Time   HGB 12.1 06/10/2014 10:30 AM   HCT 37.1 06/10/2014 10:30 AM    Discharge Diagnoses: Term Pregnancy-delivered  Discharge Information: Date:  06/12/2014 Activity: pelvic rest Diet: routine  Medications: Tylenol #3 Breast feeding:  No: Bottle feeding  Condition: stable Instructions: refer to handout Discharge to: home      Medication List    ASK your doctor about these medications        acetaminophen 325 MG tablet  Commonly known as:  TYLENOL  Take 650 mg by mouth every 6 (six) hours as needed for headache.     CONCEPT DHA 53.5-38-1 MG Caps  Take 1 capsule by mouth daily.     ferrous sulfate 325 (65 FE) MG tablet  Take 1 tablet (325 mg total) by mouth 2 (two) times daily with a meal.     flintstones complete 60 MG chewable tablet  Chew 2 tablets by mouth daily.     ibuprofen 600 MG tablet  Commonly known as:  ADVIL,MOTRIN  Take 1 tablet (600 mg total) by mouth every 6 (six) hours as needed for mild pain, moderate pain or cramping.     ondansetron 8 MG disintegrating tablet  Commonly known as:  ZOFRAN-ODT  Take 1 tablet (8 mg total) by mouth every 8 (eight) hours as needed for nausea or vomiting.     oseltamivir 75 MG capsule  Commonly known as:  TAMIFLU  Take 1 capsule (75 mg total) by mouth 2 (two) times daily.     terconazole 0.4 % vaginal cream  Commonly known as:  TERAZOL 7  Place 1 applicator vaginally at bedtime.         Kristina [name]  06/12/2014,7:54 AM

## 2014-06-13 NOTE — Progress Notes (Signed)
Ur chart review completed.  

## 2014-06-15 ENCOUNTER — Encounter: Payer: Medicaid Other | Admitting: Women's Health

## 2014-06-21 ENCOUNTER — Emergency Department (HOSPITAL_COMMUNITY)
Admission: EM | Admit: 2014-06-21 | Discharge: 2014-06-23 | Discharge status: Against Medical Advice | Payer: Medicaid Other | Attending: Emergency Medicine | Admitting: Emergency Medicine

## 2014-06-21 ENCOUNTER — Emergency Department (HOSPITAL_COMMUNITY): Payer: Medicaid Other

## 2014-06-21 ENCOUNTER — Encounter (HOSPITAL_COMMUNITY): Payer: Self-pay

## 2014-06-21 DIAGNOSIS — B9789 Other viral agents as the cause of diseases classified elsewhere: Secondary | ICD-10-CM | POA: Insufficient documentation

## 2014-06-21 DIAGNOSIS — R1031 Right lower quadrant pain: Secondary | ICD-10-CM

## 2014-06-21 DIAGNOSIS — Z79899 Other long term (current) drug therapy: Secondary | ICD-10-CM | POA: Insufficient documentation

## 2014-06-21 DIAGNOSIS — Z87891 Personal history of nicotine dependence: Secondary | ICD-10-CM | POA: Insufficient documentation

## 2014-06-21 DIAGNOSIS — O8612 Endometritis following delivery: Secondary | ICD-10-CM | POA: Insufficient documentation

## 2014-06-21 DIAGNOSIS — R109 Unspecified abdominal pain: Secondary | ICD-10-CM

## 2014-06-21 DIAGNOSIS — O9089 Other complications of the puerperium, not elsewhere classified: Secondary | ICD-10-CM | POA: Diagnosis present

## 2014-06-21 LAB — URINALYSIS, ROUTINE W REFLEX MICROSCOPIC
Bilirubin Urine: NEGATIVE
Glucose, UA: NEGATIVE mg/dL
KETONES UR: NEGATIVE mg/dL
NITRITE: NEGATIVE
PROTEIN: NEGATIVE mg/dL
Specific Gravity, Urine: 1.014 (ref 1.005–1.030)
UROBILINOGEN UA: 1 mg/dL (ref 0.0–1.0)
pH: 6.5 (ref 5.0–8.0)

## 2014-06-21 LAB — COMPREHENSIVE METABOLIC PANEL
ALBUMIN: 3.6 g/dL (ref 3.5–5.2)
ALT: 32 U/L (ref 0–35)
AST: 27 U/L (ref 0–37)
Alkaline Phosphatase: 89 U/L (ref 39–117)
Anion gap: 8 (ref 5–15)
BUN: 12 mg/dL (ref 6–23)
CO2: 27 mmol/L (ref 19–32)
CREATININE: 0.75 mg/dL (ref 0.50–1.10)
Calcium: 9 mg/dL (ref 8.4–10.5)
Chloride: 106 mmol/L (ref 96–112)
GFR calc Af Amer: >90 mL/min (ref >90)
Glucose, Bld: 87 mg/dL (ref 70–99)
Potassium: 3.6 mmol/L (ref 3.5–5.1)
SODIUM: 141 mmol/L (ref 135–145)
Total Bilirubin: 0.2 mg/dL — ABNORMAL LOW (ref 0.3–1.2)
Total Protein: 6.9 g/dL (ref 6.0–8.3)

## 2014-06-21 LAB — CBC WITH DIFFERENTIAL/PLATELET
Basophils Absolute: 0 10*3/uL (ref 0.0–0.1)
Basophils Relative: 0 % (ref 0–1)
EOS ABS: 0.2 10*3/uL (ref 0.0–0.7)
Eosinophils Relative: 2 % (ref 0–5)
HCT: 38.7 % (ref 36.0–46.0)
HEMOGLOBIN: 12.2 g/dL (ref 12.0–15.0)
LYMPHS ABS: 2.3 10*3/uL (ref 0.7–4.0)
LYMPHS PCT: 24 % (ref 12–46)
MCH: 26.7 pg (ref 26.0–34.0)
MCHC: 31.5 g/dL (ref 30.0–36.0)
MCV: 84.7 fL (ref 78.0–100.0)
MONOS PCT: 4 % (ref 3–12)
Monocytes Absolute: 0.4 10*3/uL (ref 0.1–1.0)
NEUTROS ABS: 6.8 10*3/uL (ref 1.7–7.7)
Neutrophils Relative %: 70 % (ref 43–77)
Platelets: 240 10*3/uL (ref 150–400)
RBC: 4.57 MIL/uL (ref 3.87–5.11)
RDW: 16.3 % — ABNORMAL HIGH (ref 11.5–15.5)
WBC: 9.7 10*3/uL (ref 4.0–10.5)

## 2014-06-21 LAB — URINE MICROSCOPIC-ADD ON

## 2014-06-21 MED ORDER — MORPHINE SULFATE 4 MG/ML IJ SOLN
4.0000 mg | Freq: Once | INTRAMUSCULAR | Status: AC
  Administered 2014-06-21: 4 mg via INTRAVENOUS
  Filled 2014-06-21: qty 1

## 2014-06-21 MED ORDER — IOHEXOL 300 MG/ML  SOLN
25.0000 mL | Freq: Once | INTRAMUSCULAR | Status: AC | PRN
  Administered 2014-06-21: 25 mL via ORAL

## 2014-06-21 MED ORDER — OXYCODONE-ACETAMINOPHEN 5-325 MG PO TABS: 2.0000 | ORAL_TABLET | ORAL | Status: DC | PRN

## 2014-06-21 MED ORDER — SODIUM CHLORIDE 0.9 % IV BOLUS (SEPSIS)
1000.0000 mL | Freq: Once | INTRAVENOUS | Status: AC
  Administered 2014-06-21: 1000 mL via INTRAVENOUS

## 2014-06-21 MED ORDER — METRONIDAZOLE 500 MG PO TABS: 500.0000 mg | ORAL_TABLET | Freq: Three times a day (TID) | ORAL | Status: DC

## 2014-06-21 MED ORDER — ONDANSETRON HCL 4 MG/2ML IJ SOLN
4.0000 mg | Freq: Once | INTRAMUSCULAR | Status: AC
  Administered 2014-06-21: 4 mg via INTRAVENOUS
  Filled 2014-06-21: qty 2

## 2014-06-21 MED ORDER — CIPROFLOXACIN HCL 500 MG PO TABS: 500.0000 mg | ORAL_TABLET | Freq: Two times a day (BID) | ORAL | Status: DC

## 2014-06-21 MED ORDER — IOHEXOL 300 MG/ML  SOLN
100.0000 mL | Freq: Once | INTRAMUSCULAR | Status: AC | PRN
  Administered 2014-06-21: 100 mL via INTRAVENOUS

## 2014-06-21 MED ORDER — METHYLERGONOVINE MALEATE 0.2 MG PO TABS: 0.2000 mg | ORAL_TABLET | Freq: Four times a day (QID) | ORAL | Status: DC

## 2014-06-21 NOTE — ED Notes (Signed)
Per pt, had a baby on April 1st.  Pt with sharp abdominal pain since last night.  No fever.  Still bleeding post vaginal delivery. No odor noted.  No increase in bleeding.  Pt is not nursing.  Pain with urination.  ? Foley cath during delivery.

## 2014-06-21 NOTE — ED Provider Notes (Signed)
CSN: [id number]     Arrival date & time 06/21/14  1804 History   First MD Initiated Contact with Patient 06/21/14 1946     Chief Complaint  Patient presents with  . Abdominal Pain     (Consider location/radiation/quality/duration/timing/severity/associated sxs/prior Treatment) Patient is a 24 y.o. female presenting with abdominal pain. The history is provided by the patient and medical records. No language interpreter was used.  Abdominal Pain Associated symptoms: no chest pain, no constipation, no cough, no diarrhea, no dysuria, no fatigue, no fever, no hematuria, no nausea, no shortness of breath and no vomiting      Kristina [name] is a 24 y.o. female  with a hx of G2P0202, tonsillectomy presents to the Emergency Department complaining of intermittent, progressively worsening right lower quadrant abdominal pain onset last night.  Patient reports that pain is sharp and stabbing rated at a 10/10 at its worst and worse with movement including standing and walking.  She reports that she took a Percocet last night which did help the pain. She reports pain is currently at a 6/10, while sitting still.  Patient reports spontaneous vaginal delivery on 06/10/2014. She reports usual abdominal cramping post delivery but no unusual abdominal pain until last night. Patient reports she still has moderate vaginal bleeding after delivery. She did not attempt to contact her OB/GYN. Patient denies history of abdominal surgery. She denies history of kidney stones. She denies dysuria.  Patient denies fever, chills, headache, neck pain, chest pain, shortness of breath, vomiting, diarrhea, weakness and dizziness, syncope, dysuria.  Past Medical History  Diagnosis Date  . Medical history non-contributory   . Pregnant   . Rh negative state in antepartum period   . Vaginal Pap smear, abnormal    Past Surgical History  Procedure Laterality Date  . Tonsillectomy     Family History  Problem Relation Age of Onset  .  Cancer Maternal Grandmother     breast  . Diabetes Paternal Grandfather   . Jaundice Son    History  Substance Use Topics  . Smoking status: Former Smoker    Types: Cigarettes  . Smokeless tobacco: Never Used  . Alcohol Use: No   OB History    Gravida Para Term Preterm AB TAB SAB Ectopic Multiple Living   3 2 2  1  1   0 2     Review of Systems  Constitutional: Negative for fever, diaphoresis, appetite change, fatigue and unexpected weight change.  HENT: Negative for mouth sores and trouble swallowing.   Respiratory: Negative for cough, chest tightness, shortness of breath, wheezing and stridor.   Cardiovascular: Negative for chest pain and palpitations.  Gastrointestinal: Positive for abdominal pain. Negative for nausea, vomiting, diarrhea, constipation, blood in stool, abdominal distention and rectal pain.  Genitourinary: Negative for dysuria, urgency, frequency, hematuria, flank pain and difficulty urinating.  Musculoskeletal: Negative for back pain, neck pain and neck stiffness.  Skin: Negative for rash.  Neurological: Negative for weakness.  Hematological: Negative for adenopathy.  Psychiatric/Behavioral: Negative for confusion.  All other systems reviewed and are negative.     Allergies  Review of patient's allergies indicates no known allergies.  Home Medications   Prior to Admission medications   Medication Sig Start Date End Date Taking? Authorizing Provider  oxyCODONE-acetaminophen (PERCOCET/ROXICET) 5-325 MG per tablet Take 1 tablet by mouth every 4 (four) hours as needed (for pain scale 4-7). 06/12/14  Yes Montez MoritaMarie D Lawson, CNM  Prenat-FeFum-FePo-FA-Omega 3 (CONCEPT DHA) 53.5-38-1 MG CAPS Take 1  capsule by mouth daily. 12/01/13  Yes Cheral Marker, CNM  ciprofloxacin (CIPRO) 500 MG tablet Take 1 tablet (500 mg total) by mouth 2 (two) times daily. One po bid x 10 days 06/21/14   Dahlia Client Tyffany Waldrop, PA-C  ferrous sulfate 325 (65 FE) MG tablet Take 1 tablet (325 mg  total) by mouth 2 (two) times daily with a meal. 03/21/14   Cheral Marker, CNM  ibuprofen (ADVIL,MOTRIN) 600 MG tablet Take 1 tablet (600 mg total) by mouth every 6 (six) hours as needed for mild pain, moderate pain or cramping. 05/21/13   Dorathy Kinsman, CNM  methylergonovine (METHERGINE) 0.2 MG tablet Take 1 tablet (0.2 mg total) by mouth 4 (four) times daily. 06/21/14   Khloey Chern, PA-C  metroNIDAZOLE (FLAGYL) 500 MG tablet Take 1 tablet (500 mg total) by mouth 3 (three) times daily. 06/21/14   Valree Feild, PA-C  misoprostol (CYTOTEC) 200 MCG tablet Take 4 tablets twice a day for 3 days 06/23/14   Lazaro Arms, MD  oxyCODONE-acetaminophen (PERCOCET/ROXICET) 5-325 MG per tablet Take 2 tablets by mouth every 4 (four) hours as needed for severe pain. 06/21/14   Laresa Oshiro, PA-C   BP 135/78 mmHg  Pulse 70  Temp(Src) 98.7 F (37.1 C) (Oral)  Resp 18  Ht  (1.727 m)  Wt 234 lb (106.142 kg)  BMI 35.59 kg/m2  SpO2 98%  Breastfeeding? No Physical Exam  Constitutional: She appears well-developed and well-nourished. No distress.  Awake, alert, nontoxic appearance  HENT:  Head: Normocephalic and atraumatic.  Mouth/Throat: Oropharynx is clear and moist. No oropharyngeal exudate.  Eyes: Conjunctivae are normal. No scleral icterus.  Neck: Normal range of motion. Neck supple.  Cardiovascular: Normal rate, regular rhythm, normal heart sounds and intact distal pulses.   No murmur heard. Pulmonary/Chest: Effort normal and breath sounds normal. No respiratory distress. She has no wheezes.  Equal chest expansion  Abdominal: Soft. Bowel sounds are normal. She exhibits no distension and no mass. There is tenderness. There is rebound and guarding. There is no CVA tenderness.  Right lower quadrant tenderness with guarding and mild rebound No CVA tenderness  Musculoskeletal: Normal range of motion. She exhibits no edema.  Neurological: She is alert.  Speech is clear and goal  oriented Moves extremities without ataxia  Skin: Skin is warm and dry. She is not diaphoretic.  Psychiatric: She has a normal mood and affect.  Nursing note and vitals reviewed.   ED Course  Procedures (including critical care time) Labs Review Labs Reviewed  CBC WITH DIFFERENTIAL/PLATELET - Abnormal; Notable for the following:    RDW 16.3 (*)    All other components within normal limits  COMPREHENSIVE METABOLIC PANEL - Abnormal; Notable for the following:    Total Bilirubin 0.2 (*)    All other components within normal limits  URINALYSIS, ROUTINE W REFLEX MICROSCOPIC - Abnormal; Notable for the following:    Hgb urine dipstick LARGE (*)    Leukocytes, UA MODERATE (*)    All other components within normal limits  URINE MICROSCOPIC-ADD ON    Imaging Review No results found.   EKG Interpretation None      MDM   Final diagnoses:  RLQ abdominal pain  Endometritis following delivery  Retained products of conception after delivery without hemorrhage   Kristina [name] presents with right lower quadrant abdominal pain 12 days postpartum spontaneous vaginal delivery.  Abdomen is soft however she guards with palpation of the right lower quadrant and has mild  rebound. Labs are reassuring. Will obtain CT scan. Patient reports she is not currently breast-feeding.  10:46 PM CT with questionable mild right ureteric enhancement concerning for possible urinary tract infection however patient's urinalysis has no bacteria but 7-10 white blood cells. Patient denies dysuria or other urinary symptoms.  On pelvic exam cervical os is open with brown discharge; no obvious purulence however patient is exquisitely tender over her uterus.  Will consult with her OB/GYN.  11:20 PM Pt discussed with Dt. Stinson who recommends pelvic US for retained products of conception and plan to treat endometritis with cipro and flagyl  1AM Ultrasound pending with concern for retained products of conception.   Plan for d/c home with Methergine if this is found.    Care transferred to Elsie Stain, NP who will dispo patient after results of ultrasound.  Pt is to see OB/GYN TOMORROW.      BP 135/78 mmHg  Pulse 70  Temp(Src) 98.7 F (37.1 C) (Oral)  Resp 18  Ht  (1.727 m)  Wt 234 lb (106.142 kg)  BMI 35.59 kg/m2  SpO2 98%  Breastfeeding? No    Dierdre Forth, PA-C 06/24/14 1805  Benjiman Core, MD 06/25/14 1247

## 2014-06-21 NOTE — Discharge Instructions (Signed)
1. Medications: Cipro, Falgyl, usual home medications 2. Treatment: rest, drink plenty of fluids, complete antibiotics 3. Follow Up: Please followup with your OB/GYN in 24 hours for discussion of your diagnoses and further evaluation after today's visit; if you do not have a primary care doctor use the resource guide provided to find one; Please return to the ER for fever or worsening symptoms

## 2014-06-21 NOTE — ED Notes (Signed)
Patient transported to CT 

## 2014-06-22 ENCOUNTER — Telehealth: Payer: Self-pay | Admitting: Obstetrics & Gynecology

## 2014-06-22 NOTE — ED Provider Notes (Signed)
Patient refused to wait for results of ultrasound left before this was resulted, although I did discuss with her and give her the precautions dangers of leaving AGAINST MEDICAL ADVICE. Ultrasound showed that she had retained products.  Nurses attempted to call homes so that patient could pick up her prescriptions for methotrexate  Earley FavorGail Abdulah Iqbal, NP 06/22/14 [id number]  Marisa Severinlga Otter, MD 06/22/14 [phone]

## 2014-06-22 NOTE — Telephone Encounter (Signed)
C/o abdominal pain [facility] was told had still had placenta left in uterus and was told to f/u with our office. Call transferred to front staff for an appt to be scheduled for tomorrow.

## 2014-06-23 ENCOUNTER — Encounter: Payer: Self-pay | Admitting: Obstetrics & Gynecology

## 2014-06-23 ENCOUNTER — Ambulatory Visit (INDEPENDENT_AMBULATORY_CARE_PROVIDER_SITE_OTHER): Payer: Medicaid Other | Admitting: Obstetrics & Gynecology

## 2014-06-23 VITALS — BP 130/50 | HR 80 | Ht 68.0 in | Wt 224.0 lb

## 2014-06-23 DIAGNOSIS — O8612 Endometritis following delivery: Secondary | ICD-10-CM | POA: Diagnosis not present

## 2014-06-23 MED ORDER — MISOPROSTOL 200 MCG PO TABS: ORAL_TABLET | ORAL | Status: DC

## 2014-06-23 NOTE — Progress Notes (Signed)
Patient ID: Kristina [name], female   DOB: 06-03-1990, 24 y.o.   MRN: [id number]   Chief Complaint  Patient presents with  . went to [facility] ER on 4/12    was having pain in stomach; ER thought she may have some retained placenta    Pt seen as a follow up from the ED HPI:    24 y.o. [id number] No LMP recorded.  Post partum from 06/10/2014 Pelvic pain began 3 days ago No increase in her bleeding, period like Location:  pelvic. Quality:  stabbing. Severity:  moderate. Timing:  episodic. Duration:  5 minutes to 30 sec. Context:  No associated. Modifying factors:   Signs/Symptoms:      Current outpatient prescriptions:  .  ciprofloxacin (CIPRO) 500 MG tablet, Take 1 tablet (500 mg total) by mouth 2 (two) times daily. One po bid x 10 days, Disp: 20 tablet, Rfl: 0 .  ferrous sulfate 325 (65 FE) MG tablet, Take 1 tablet (325 mg total) by mouth 2 (two) times daily with a meal., Disp: 60 tablet, Rfl: 3 .  ibuprofen (ADVIL,MOTRIN) 600 MG tablet, Take 1 tablet (600 mg total) by mouth every 6 (six) hours as needed for mild pain, moderate pain or cramping., Disp: 30 tablet, Rfl: 1 .  methylergonovine (METHERGINE) 0.2 MG tablet, Take 1 tablet (0.2 mg total) by mouth 4 (four) times daily., Disp: 25 tablet, Rfl: 0 .  metroNIDAZOLE (FLAGYL) 500 MG tablet, Take 1 tablet (500 mg total) by mouth 3 (three) times daily., Disp: 30 tablet, Rfl: 0 .  oxyCODONE-acetaminophen (PERCOCET/ROXICET) 5-325 MG per tablet, Take 1 tablet by mouth every 4 (four) hours as needed (for pain scale 4-7)., Disp: 30 tablet, Rfl: 0 .  oxyCODONE-acetaminophen (PERCOCET/ROXICET) 5-325 MG per tablet, Take 2 tablets by mouth every 4 (four) hours as needed for severe pain., Disp: 15 tablet, Rfl: 0 .  Prenat-FeFum-FePo-FA-Omega 3 (CONCEPT DHA) 53.5-38-1 MG CAPS, Take 1 capsule by mouth daily., Disp: 30 capsule, Rfl: 11  Problem Pertinent ROS:       No burning with urination, frequency or urgency No nausea, vomiting or diarrhea Nor  fever chills or other constitutional symptoms   Extended ROS:        PMFSH:             Past Medical History  Diagnosis Date  . Medical history non-contributory   . Pregnant   . Rh negative state in antepartum period   . Vaginal Pap smear, abnormal     Past Surgical History  Procedure Laterality Date  . Tonsillectomy      OB History    Gravida Para Term Preterm AB TAB SAB Ectopic Multiple Living   0 2      No Known Allergies  History   Social History  . Marital Status: Single    Spouse Name: N/A  . Number of Children: N/A  . Years of Education: N/A   Social History Main Topics  . Smoking status: Former Smoker    Types: Cigarettes  . Smokeless tobacco: Never Used  . Alcohol Use: No  . Drug Use: No  . Sexual Activity: Not Currently    Birth Control/ Protection: None   Other Topics Concern  . None   Social History Narrative    Family History  Problem Relation Age of Onset  . Cancer Maternal Grandmother     breast  . Diabetes Paternal Grandfather   . Jaundice Son  Examination:  Vitals:  Blood pressure 130/50, pulse 80, height 5\' 8"  (1.727 m), weight 224 lb (101.606 kg), not currently breastfeeding.    Physical Examination:     General Appearance:  well developed, well nourished and in no acute distress  Vulva:  normal appearing vulva with no masses, tenderness or lesions Vagina:  normal mucosa, scant blood Cervix:  cervical motion tenderness Uterus:  enlarged, 14 weeks size, tender and tender on motion Adnexa: ovaries:present,  normal adnexa in size, nontender and no masses     DATA orders and reviews: Labs were not ordered today:   Imaging studies were not ordered today:    Lab tests were reviewed today:   CBC and CMP Imaging studies were reviewed today:  sonogram  I did independently review/view images, tracing or specimen(not simply the report) myself.  Prescription Drug Management:  New Prescriptions:  Cytotec Renewed Prescriptions:   Current prescription changes:  Continue current Cipro and flagyl as previously prescribed   Impression/Plan(Problem Based): 1.  Post partum endometritis      (new problem) : Additional workup is not needed:  Re evaluation in 1 week      Follow Up:   1  weeks

## 2014-07-04 ENCOUNTER — Encounter: Payer: Self-pay | Admitting: Obstetrics & Gynecology

## 2014-07-08 ENCOUNTER — Encounter: Payer: Self-pay | Admitting: Obstetrics & Gynecology

## 2014-07-21 ENCOUNTER — Encounter: Payer: Self-pay | Admitting: Advanced Practice Midwife

## 2014-07-21 ENCOUNTER — Ambulatory Visit (INDEPENDENT_AMBULATORY_CARE_PROVIDER_SITE_OTHER): Payer: Medicaid Other | Admitting: Advanced Practice Midwife

## 2014-07-21 VITALS — BP 136/84 | HR 76 | Ht 68.0 in | Wt 218.5 lb

## 2014-07-21 DIAGNOSIS — Z3202 Encounter for pregnancy test, result negative: Secondary | ICD-10-CM | POA: Diagnosis not present

## 2014-07-21 DIAGNOSIS — R1010 Upper abdominal pain, unspecified: Secondary | ICD-10-CM

## 2014-07-21 LAB — POCT URINE PREGNANCY: PREG TEST UR: NEGATIVE

## 2014-07-21 MED ORDER — NORELGESTROMIN-ETH ESTRADIOL 150-35 MCG/24HR TD PTWK: 1.0000 | MEDICATED_PATCH | TRANSDERMAL | Status: DC

## 2014-07-21 MED ORDER — ESOMEPRAZOLE MAGNESIUM 20 MG PO PACK: 20.0000 mg | PACK | Freq: Every day | ORAL | Status: DC

## 2014-07-21 MED ORDER — ESOMEPRAZOLE MAGNESIUM 20 MG PO CPDR: 20.0000 mg | DELAYED_RELEASE_CAPSULE | Freq: Every day | ORAL | Status: DC

## 2014-07-21 NOTE — Addendum Note (Signed)
Addended by: Jacklyn ShellRESENZO-DISHMON, Sanford Lindblad on: 07/21/2014 02:21 PM   Modules accepted: Orders

## 2014-07-21 NOTE — Progress Notes (Signed)
  Kristina [name] is a 24 y.o. who presents for a postpartum visit. Kristina [name] is 6 weeks postpartum following a spontaneous vaginal delivery. I have fully reviewed the prenatal and intrapartum course. The delivery was at 39.1 gestational weeks.  Anesthesia: epidural. Postpartum course has been complicated by pp endometritis and possible retained products at 2 weeks pp.  Kristina [name] received cytotec, cipro and flagyl.  Kristina [name] recovered nicely from this, but about 2 weeks.ago, Kristina [name] started experiencing "severe, random" upper abdominal pain.  It starts on the RUQ, but radiates across upper abdomen and to back.  Also has epigastric pain.  Not exacerbated by anything.  Not relieved by anything.  Baby's course has been uneventful. Baby is feeding by bottle. Bleeding: started period today. Bowel function is normal. Bladder function is normal. Patient is sexually active. Contraception method is none. Postpartum depression screening: negative.   Current outpatient prescriptions:  .  esomeprazole (NEXIUM) 20 MG capsule, Take 1 capsule (20 mg total) by mouth daily at 12 noon., Disp: 30 capsule, Rfl: 6  Review of Systems   Constitutional: Negative for fever and chills Eyes: Negative for visual disturbances Respiratory: Negative for shortness of breath, dyspnea Cardiovascular: Negative for chest pain or palpitations  Gastrointestinal: Negative for vomiting, diarrhea and constipation Genitourinary: Negative for dysuria and urgency Musculoskeletal: Negative for back pain, joint pain, myalgias  Neurological: Negative for dizziness and headaches   Objective:     Filed Vitals:   07/21/14 1106  BP: 136/84  Pulse: 76   General:  alert, cooperative and no distress   Breasts:  negative  Lungs: clear to auscultation bilaterally  Heart:  regular rate and rhythm  Abdomen: Soft, nontender   Vulva:  normal  Vagina: normal vagina  Cervix:  closed  Corpus: Well involuted     Rectal Exam: no hemorrhoids        Assessment:    normal postpartum exam. Upper abd pain:  GB/indigestion?    Plan:    1. Contraception: Ortho-Evra patches weekly condoms for 1st month 2. Nexium 20 mg:  2 po for 3 days, then 1/day 3. Gallbladder US ordered 4. Follow up as needed. Will discuss GB US results by phone

## 2014-07-21 NOTE — Patient Instructions (Signed)

## 2014-07-25 ENCOUNTER — Ambulatory Visit: Payer: Self-pay | Admitting: Women's Health

## 2014-07-25 ENCOUNTER — Telehealth: Payer: Self-pay | Admitting: Advanced Practice Midwife

## 2014-07-25 NOTE — Telephone Encounter (Signed)
Ultrasound scheduled for 08/01/2014 at 10:15 am, nothing to eat or drink after midnight the night before. Pt verbalized understanding.

## 2014-08-01 ENCOUNTER — Ambulatory Visit (HOSPITAL_COMMUNITY)
Admission: RE | Admit: 2014-08-01 | Discharge: 2014-08-01 | Disposition: A | Discharge status: Home / Self Care | Payer: Medicaid Other | Source: Ambulatory Visit | Attending: Advanced Practice Midwife | Admitting: Advanced Practice Midwife

## 2014-08-01 ENCOUNTER — Other Ambulatory Visit: Payer: Self-pay | Admitting: Advanced Practice Midwife

## 2014-08-01 DIAGNOSIS — R1010 Upper abdominal pain, unspecified: Secondary | ICD-10-CM

## 2014-08-09 ENCOUNTER — Telehealth: Payer: Self-pay | Admitting: *Deleted

## 2014-08-09 NOTE — Telephone Encounter (Signed)
Pt states she was sent for an U/S of her gallbladder and was told to call Drenda FreezeFran for the results.

## 2014-08-11 NOTE — Telephone Encounter (Signed)
Pt was not fasting for GB US.  But now she is feeling fine.  Will repeat it if becomes sx again

## 2014-08-15 ENCOUNTER — Telehealth: Payer: Self-pay | Admitting: Advanced Practice Midwife

## 2014-08-15 NOTE — Telephone Encounter (Signed)
[facility], she started her period 5/28 and is still bleeding.  I informed pt that it can be normal to have some abnormal periods after changing or starting a new birth control.  Pt verbalized understanding.

## 2014-10-27 ENCOUNTER — Encounter: Payer: Self-pay | Admitting: Advanced Practice Midwife

## 2014-10-27 ENCOUNTER — Ambulatory Visit (INDEPENDENT_AMBULATORY_CARE_PROVIDER_SITE_OTHER): Payer: BLUE CROSS/BLUE SHIELD | Admitting: Advanced Practice Midwife

## 2014-10-27 VITALS — BP 110/58 | HR 68 | Ht 68.0 in | Wt 244.0 lb

## 2014-10-27 DIAGNOSIS — Z3009 Encounter for other general counseling and advice on contraception: Secondary | ICD-10-CM | POA: Diagnosis not present

## 2014-10-27 NOTE — Progress Notes (Signed)
   [facility] Visit  Patient name: Kristina [name] MRN [id number]  Date of birth: 1990/11/01  CC & HPI:  JANUS VLCEK is a 24 y.o. Caucasian female presenting today for contraception counseling. Used patch for a month or so, but it was itching.  Wants Mirena IUD.  SE/Risks/Benefits discussed. LMP 8/2  Pertinent History Reviewed:  Medical & Surgical Hx:   Past Medical History  Diagnosis Date  . Medical history non-contributory   . Pregnant   . Rh negative state in antepartum period   . Vaginal Pap smear, abnormal    Past Surgical History  Procedure Laterality Date  . Tonsillectomy     Family History  Problem Relation Age of Onset  . Cancer Maternal Grandmother     breast  . Diabetes Paternal Grandfather   . Jaundice Son    No current outpatient prescriptions on file. Social History: Reviewed -  reports that she has quit smoking. Her smoking use included Cigarettes. She has never used smokeless tobacco.  Review of Systems:   Constitutional: Negative for fever and chills Eyes: Negative for visual disturbances Respiratory: Negative for shortness of breath, dyspnea Cardiovascular: Negative for chest pain or palpitations  Gastrointestinal: Negative for vomiting, diarrhea and constipation; no abdominal pain Genitourinary: Negative for dysuria and urgency, vaginal irritation or itching Musculoskeletal: Negative for back pain, joint pain, myalgias  Neurological: Negative for dizziness and headaches    Objective Findings:  Vitals: BP 110/58 mmHg  Pulse 68  Ht  (1.727 m)  Wt 244 lb (110.678 kg)  BMI 37.11 kg/m2  LMP 10/11/2014  Physical Examination: General appearance - alert, well appearing, and in no distress Mental status - alert, oriented to person, place, and time Chest - normal re Extremities - no pedal edema noted Skin - normal coloration and turgor, no rashes, no suspicious skin lesions noted  No results found for this or any previous visit (from the  past 24 hour(s)).  50% or more of this visit was spent in counseling and coordination of care.  10 minutes of face to face time.     Assessment & Plan:  A:   Contraception counseling P:     F/U 9/6 for Mirena   CRESENZO-DISHMAN,Diya Gervasi CNM 10/27/2014 2:48 PM

## 2014-10-27 NOTE — Patient Instructions (Signed)
Levonorgestrel intrauterine device (IUD) What is this medicine? LEVONORGESTREL IUD (LEE voe nor jes trel) is a contraceptive (birth control) device. The device is placed inside the uterus by a healthcare professional. It is used to prevent pregnancy and can also be used to treat heavy bleeding that occurs during your period. Depending on the device, it can be used for 3 to 5 years. This medicine may be used for other purposes; ask your health care provider or pharmacist if you have questions. COMMON BRAND NAME(S): LILETTA, Mirena, Skyla What should I tell my health care provider before I take this medicine? They need to know if you have any of these conditions: -abnormal Pap smear -cancer of the breast, uterus, or cervix -diabetes -endometritis -genital or pelvic infection now or in the past -have more than one sexual partner or your partner has more than one partner -heart disease -history of an ectopic or tubal pregnancy -immune system problems -IUD in place -liver disease or tumor -problems with blood clots or take blood-thinners -use intravenous drugs -uterus of unusual shape -vaginal bleeding that has not been explained -an unusual or allergic reaction to levonorgestrel, other hormones, silicone, or polyethylene, medicines, foods, dyes, or preservatives -pregnant or trying to get pregnant -breast-feeding How should I use this medicine? This device is placed inside the uterus by a health care professional. Talk to your pediatrician regarding the use of this medicine in children. Special care may be needed. Overdosage: If you think you have taken too much of this medicine contact a poison control center or emergency room at once. NOTE: This medicine is only for you. Do not share this medicine with others. What if I miss a dose? This does not apply. What may interact with this medicine? Do not take this medicine with any of the following  medications: -amprenavir -bosentan -fosamprenavir This medicine may also interact with the following medications: -aprepitant -barbiturate medicines for inducing sleep or treating seizures -bexarotene -griseofulvin -medicines to treat seizures like carbamazepine, ethotoin, felbamate, oxcarbazepine, phenytoin, topiramate -modafinil -pioglitazone -rifabutin -rifampin -rifapentine -some medicines to treat HIV infection like atazanavir, indinavir, lopinavir, nelfinavir, tipranavir, ritonavir -St. John's wort -warfarin This list may not describe all possible interactions. Give your health care provider a list of all the medicines, herbs, non-prescription drugs, or dietary supplements you use. Also tell them if you smoke, drink alcohol, or use illegal drugs. Some items may interact with your medicine. What should I watch for while using this medicine? Visit your doctor or health care professional for regular check ups. See your doctor if you or your partner has sexual contact with others, becomes HIV positive, or gets a sexual transmitted disease. This product does not protect you against HIV infection (AIDS) or other sexually transmitted diseases. You can check the placement of the IUD yourself by reaching up to the top of your vagina with clean fingers to feel the threads. Do not pull on the threads. It is a good habit to check placement after each menstrual period. Call your doctor right away if you feel more of the IUD than just the threads or if you cannot feel the threads at all. The IUD may come out by itself. You may become pregnant if the device comes out. If you notice that the IUD has come out use a backup birth control method like condoms and call your health care provider. Using tampons will not change the position of the IUD and are okay to use during your period. What side effects may   I notice from receiving this medicine? Side effects that you should report to your doctor or  health care professional as soon as possible: -allergic reactions like skin rash, itching or hives, swelling of the face, lips, or tongue -fever, flu-like symptoms -genital sores -high blood pressure -no menstrual period for 6 weeks during use -pain, swelling, warmth in the leg -pelvic pain or tenderness -severe or sudden headache -signs of pregnancy -stomach cramping -sudden shortness of breath -trouble with balance, talking, or walking -unusual vaginal bleeding, discharge -yellowing of the eyes or skin Side effects that usually do not require medical attention (report to your doctor or health care professional if they continue or are bothersome): -acne -breast pain -change in sex drive or performance -changes in weight -cramping, dizziness, or faintness while the device is being inserted -headache -irregular menstrual bleeding within first 3 to 6 months of use -nausea This list may not describe all possible side effects. Call your doctor for medical advice about side effects. You may report side effects to FDA at 1-800-FDA-1088. Where should I keep my medicine? This does not apply. NOTE: This sheet is a summary. It may not cover all possible information. If you have questions about this medicine, talk to your doctor, pharmacist, or health care provider.  2015, Elsevier/Gold Standard. (2011-03-28 13:54:04)  

## 2014-11-15 ENCOUNTER — Ambulatory Visit: Payer: BLUE CROSS/BLUE SHIELD | Admitting: Women's Health

## 2014-11-16 ENCOUNTER — Ambulatory Visit (INDEPENDENT_AMBULATORY_CARE_PROVIDER_SITE_OTHER): Payer: BLUE CROSS/BLUE SHIELD | Admitting: Women's Health

## 2014-11-16 ENCOUNTER — Encounter: Payer: Self-pay | Admitting: Women's Health

## 2014-11-16 VITALS — BP 126/64 | HR 68 | Wt 244.0 lb

## 2014-11-16 DIAGNOSIS — Z3202 Encounter for pregnancy test, result negative: Secondary | ICD-10-CM | POA: Diagnosis not present

## 2014-11-16 DIAGNOSIS — Z30011 Encounter for initial prescription of contraceptive pills: Secondary | ICD-10-CM | POA: Diagnosis not present

## 2014-11-16 LAB — POCT URINE PREGNANCY: Preg Test, Ur: NEGATIVE

## 2014-11-16 MED ORDER — NORETHIN-ETH ESTRAD-FE BIPHAS 1 MG-10 MCG / 10 MCG PO TABS: 1.0000 | ORAL_TABLET | Freq: Every day | ORAL | Status: DC

## 2014-11-16 NOTE — Patient Instructions (Signed)
Oral Contraception Use Oral contraceptive pills (OCPs) are medicines taken to prevent pregnancy. OCPs work by preventing the ovaries from releasing eggs. The hormones in OCPs also cause the cervical mucus to thicken, preventing the sperm from entering the uterus. The hormones also cause the uterine lining to become thin, not allowing a fertilized egg to attach to the inside of the uterus. OCPs are highly effective when taken exactly as prescribed. However, OCPs do not prevent sexually transmitted diseases (STDs). Safe sex practices, such as using condoms along with an OCP, can help prevent STDs. Before taking OCPs, you may have a physical exam and Pap test. Your health care provider may also order blood tests if necessary. Your health care provider will make sure you are a good candidate for oral contraception. Discuss with your health care provider the possible side effects of the OCP you may be prescribed. When starting an OCP, it can take 2 to 3 months for the body to adjust to the changes in hormone levels in your body.  HOW TO TAKE ORAL CONTRACEPTIVE PILLS Your health care provider may advise you on how to start taking the first cycle of OCPs. Otherwise, you can:   Start on day 1 of your menstrual period. You will not need any backup contraceptive protection with this start time.   Start on the first Sunday after your menstrual period or the day you get your prescription. In these cases, you will need to use backup contraceptive protection for the first week.   Start the pill at any time of your cycle. If you take the pill within 5 days of the start of your period, you are protected against pregnancy right away. In this case, you will not need a backup form of birth control. If you start at any other time of your menstrual cycle, you will need to use another form of birth control for 7 days. If your OCP is the type called a minipill, it will protect you from pregnancy after taking it for 2 days (48  hours). After you have started taking OCPs:   If you forget to take 1 pill, take it as soon as you remember. Take the next pill at the regular time.   If you miss 2 or more pills, call your health care provider because different pills have different instructions for missed doses. Use backup birth control until your next menstrual period starts.   If you use a 28-day pack that contains inactive pills and you miss 1 of the last 7 pills (pills with no hormones), it will not matter. Throw away the rest of the non-hormone pills and start a new pill pack.  No matter which day you start the OCP, you will always start a new pack on that same day of the week. Have an extra pack of OCPs and a backup contraceptive method available in case you miss some pills or lose your OCP pack.  HOME CARE INSTRUCTIONS   Do not smoke.   Always use a condom to protect against STDs. OCPs do not protect against STDs.   Use a calendar to mark your menstrual period days.   Read the information and directions that came with your OCP. Talk to your health care provider if you have questions.  SEEK MEDICAL CARE IF:   You develop nausea and vomiting.   You have abnormal vaginal discharge or bleeding.   You develop a rash.   You miss your menstrual period.   You are losing   your hair.   You need treatment for mood swings or depression.   You get dizzy when taking the OCP.   You develop acne from taking the OCP.   You become pregnant.  SEEK IMMEDIATE MEDICAL CARE IF:   You develop chest pain.   You develop shortness of breath.   You have an uncontrolled or severe headache.   You develop numbness or slurred speech.   You develop visual problems.   You develop pain, redness, and swelling in the legs.  Document Released: 02/14/2011 Document Revised: 07/12/2013 Document Reviewed: 08/16/2012 ExitCare Patient Information 2015 [employer]. This information is not intended to replace  advice given to you by your health care provider. Make sure you discuss any questions you have with your health care provider.  

## 2014-11-16 NOTE — Progress Notes (Signed)
Patient ID: EDAN SERRATORE, female   DOB: 1990/11/01, 24 y.o.   MRN: [id number]   [facility] Visit  Patient name: ILENA DIECKMAN MRN [id number]  Date of birth: Feb 23, 1991  CC & HPI:   Chief Complaint  Patient presents with  . Contraception    discuss birth control options   SHANNEN FLANSBURG is a 24 y.o. [id number] Caucasian female presenting today to discuss contraception. Was on orthro evra, didn't like, then wanted IUD but changed mind. Wants to try pills. Does not smoke, no h/o HTN, DVT/PE, CVA, MI, or migraines w/ aura.  Patient's last menstrual period was 11/08/2014. The current method of family planning is none. Last pap 03/16/14 normal  Pertinent History Reviewed:  Medical & Surgical Hx:   Past Medical History  Diagnosis Date  . Medical history non-contributory   . Pregnant   . Rh negative state in antepartum period   . Vaginal Pap smear, abnormal    Past Surgical History  Procedure Laterality Date  . Tonsillectomy     Medications: Reviewed & Updated - see associated section Social History: Reviewed -  reports that she has quit smoking. Her smoking use included Cigarettes. She has never used smokeless tobacco.  Objective Findings:  Vitals: BP 126/64 mmHg  Pulse 68  Wt 244 lb (110.678 kg)  LMP 11/08/2014  Breastfeeding? No Body mass index is 37.11 kg/(m^2).  Physical Examination: General appearance - alert, well appearing, and in no distress  Results for orders placed or performed in visit on 11/16/14 (from the past 24 hour(s))  POCT urine pregnancy   Collection Time: 11/16/14  3:53 PM  Result Value Ref Range   Preg Test, Ur Negative Negative     Assessment & Plan:  A:   Contraception management  P:  Rx LoLoestrin w/ 11RF  Condoms x 2wks for back up  Return in about 3 months (around 02/15/2015) for F/U.  Marge Duncans CNM, [facility] 11/16/2014 4:23 PM

## 2014-11-17 ENCOUNTER — Telehealth: Payer: Self-pay | Admitting: Advanced Practice Midwife

## 2014-11-17 DIAGNOSIS — R1011 Right upper quadrant pain: Secondary | ICD-10-CM

## 2014-11-17 NOTE — Telephone Encounter (Signed)
Pt c/o Upper Right Quadrant pain that extends to upper back, episodes last 30 min to 1 hour.  Pt states she had dicussed with Drenda Freeze in the past and Drenda Freeze had mentioned ordering Gallbladder Ultrasound. Please advise.

## 2014-11-17 NOTE — Telephone Encounter (Signed)
RUQ [place] ordered.  Pt to call radiology to schedule.

## 2014-11-17 NOTE — Telephone Encounter (Signed)
Pt informed order for ultrasound in epic. Pt to call [facility] X-ray dept for appt date and time. Pt verbalized understanding.

## 2014-11-22 ENCOUNTER — Ambulatory Visit (HOSPITAL_COMMUNITY)
Admission: RE | Admit: 2014-11-22 | Discharge: 2014-11-22 | Disposition: A | Discharge status: Home / Self Care | Payer: BLUE CROSS/BLUE SHIELD | Source: Ambulatory Visit | Attending: Advanced Practice Midwife | Admitting: Advanced Practice Midwife

## 2014-11-22 DIAGNOSIS — K8020 Calculus of gallbladder without cholecystitis without obstruction: Secondary | ICD-10-CM | POA: Diagnosis not present

## 2014-11-22 DIAGNOSIS — R1011 Right upper quadrant pain: Secondary | ICD-10-CM | POA: Diagnosis present

## 2014-11-29 ENCOUNTER — Encounter: Payer: Self-pay | Admitting: Advanced Practice Midwife

## 2014-11-30 ENCOUNTER — Other Ambulatory Visit: Payer: Self-pay | Admitting: Advanced Practice Midwife

## 2014-12-30 NOTE — Patient Instructions (Signed)
Kristina [name]  12/30/2014     @PREFPERIOPPHARMACY @   Your procedure is scheduled on  01/04/2015 .  [facility]15  [facility].M.  Call this number if you have problems the morning of surgery:  [phone]   Remember:  Do not eat food or drink liquids after midnight.  Take these medicines the morning of surgery with A SIP OF WATER:  none   Do not wear jewelry, make-up or nail polish.  Do not wear lotions, powders, or perfumes.  You may wear deodorant.  Do not shave 48 hours prior to surgery.  Men may shave face and neck.  Do not bring valuables to the hospital.  [facility] for any belongings or valuables.  Contacts, dentures or bridgework may not be worn into surgery.  Leave your suitcase in the car.  After surgery it may be brought to your room.  For patients admitted to the hospital, discharge time will be determined by your treatment team.  Patients discharged the day of surgery will not be allowed to drive home.   Name and phone number of your driver:   family Special instructions:  none  Please read over the following fact sheets that you were given. Pain Booklet, Coughing and Deep Breathing, Surgical Site Infection Prevention, Anesthesia Post-op Instructions and Care and Recovery After Surgery      Laparoscopic Cholecystectomy Laparoscopic cholecystectomy is surgery to remove the gallbladder. The gallbladder is located in the upper right part of the abdomen, behind the liver. It is a storage sac for bile, which is produced in the liver. Bile aids in the digestion and absorption of fats. Cholecystectomy is often done for inflammation of the gallbladder (cholecystitis). This condition is usually caused by a buildup of gallstones (cholelithiasis) in the gallbladder. Gallstones can block the flow of bile, and that can result in inflammation and pain. In severe cases, emergency surgery may be required. If emergency surgery is not required,  you will have time to prepare for the procedure. Laparoscopic surgery is an alternative to open surgery. Laparoscopic surgery has a shorter recovery time. Your common bile duct may also need to be examined during the procedure. If stones are found in the common bile duct, they may be removed. LET [facility] PROVID[facility] KNOW ABOUT:  Any allergies you have.  All medicines you are taking, including vitamins, herbs, eye drops, creams, and over-the-counter medicines.  Previous problems you or members of your family have had with the use of anesthetics.  Any blood disorders you have.  Previous surgeries you have had.  Any medical conditions you have. RISKS AND COMPLICATIONS Generally, this is a safe procedure. However, problems may occur, including:  Infection.  Bleeding.  Allergic reactions to medicines.  Damage to other structures or organs.  A stone remaining in the common bile duct.  A bile leak from the cyst duct that is clipped when your gallbladder is removed.  The need to convert to open surgery, which requires a larger incision in the abdomen. This may be necessary if your surgeon thinks that it is not safe to continue with a laparoscopic procedure. BEFORE THE PROCEDURE  Ask your health care provider about:  Changing or stopping your regular medicines. This is especially important if you are taking diabetes medicines or blood thinners.  Taking medicines such as aspirin and ibuprofen. These medicines can thin your blood. Do not take these medicines before your procedure  if your health care provider instructs you not to.  Follow instructions from your health care provider about eating or drinking restrictions.  Let your health care provider know if you develop a cold or an infection before surgery.  Plan to have someone take you home after the procedure.  Ask your health care provider how your surgical site will be marked or identified.  You may be given antibiotic  medicine to help prevent infection. PROCEDURE  To reduce your risk of infection:  Your health care team will wash or sanitize their hands.  Your skin will be washed with soap.  An IV tube may be inserted into one of your veins.  You will be given a medicine to make you fall asleep (general anesthetic).  A breathing tube will be placed in your mouth.  The surgeon will make several small cuts (incisions) in your abdomen.  A thin, lighted tube (laparoscope) that has a tiny camera on the end will be inserted through one of the small incisions. The camera on the laparoscope will send a picture to a TV screen (monitor) in the operating room. This will give the surgeon a good view inside your abdomen.  A gas will be pumped into your abdomen. This will expand your abdomen to give the surgeon more room to perform the surgery.  Other tools that are needed for the procedure will be inserted through the other incisions. The gallbladder will be removed through one of the incisions.  After your gallbladder has been removed, the incisions will be closed with stitches (sutures), staples, or skin glue.  Your incisions may be covered with a bandage (dressing). The procedure may vary among health care providers and hospitals. AFTER THE PROCEDURE  Your blood pressure, heart rate, breathing rate, and blood oxygen level will be monitored often until the medicines you were given have worn off.  You will be given medicines as needed to control your pain.   This information is not intended to replace advice given to you by your health care provider. Make sure you discuss any questions you have with your health care provider.   Document Released: 02/25/2005 Document Revised: 11/16/2014 Document Reviewed: 10/07/2012 Elsevier Interactive Patient Education 2016 Elsevier [employer]. PATIENT INSTRUCTIONS POST-ANESTHESIA  IMMEDIATELY FOLLOWING SURGERY:  Do not drive or operate machinery for the first twenty four  hours after surgery.  Do not make any important decisions for twenty four hours after surgery or while taking narcotic pain medications or sedatives.  If you develop intractable nausea and vomiting or a severe headache please notify your doctor immediately.  FOLLOW-UP:  Please make an appointment with your surgeon as instructed. You do not need to follow up with anesthesia unless specifically instructed to do so.  WOUND CARE INSTRUCTIONS (if applicable):  Keep a dry clean dressing on the anesthesia/puncture wound site if there is drainage.  Once the wound has quit draining you may leave it open to air.  Generally you should leave the bandage intact for twenty four hours unless there is drainage.  If the epidural site drains for more than 36-48 hours please call the anesthesia department.  QUESTIONS?:  Please feel free to call your physician or the hospital operator if you have any questions, and they will be happy to assist you.

## 2014-12-30 NOTE — H&P (Signed)
  NTS SOAP Note  Vital Signs:  Vitals as of: 12/29/2014: Systolic 133: Diastolic 83: Heart Rate 76: Temp 98.4F: Height 625ft 8in: Weight 247Lbs 0 Ounces: BMI 37.56  BMI : 37.56 kg/m2  Subjective: This 24 year old female presents for of right upper quadrant abdominal pain.  Has been occurring for some time, made worse with fatty foods.  Is getting more frequent in nature.  Pain radiates around right flank to back.  + nausea.  No fever, chills, jaundice.  Review of Symptoms:  Constitutional:unremarkable   Head:unremarkable Eyes:unremarkable   Nose/Mouth/Throat:unremarkable Cardiovascular:  unremarkable Respiratory:unremarkable Gastrointestinabdominal pain, nausea, heartburn Genitourinary:unremarkable   Musculoskeletal:unremarkable Skin:unremarkable Hematolgic/Lymphatic:unremarkable   Allergic/Immunologic:unremarkable   Past Medical History:  Reviewed  Past Medical History  Surgical History: tonsillectomy Medical Problems: none Allergies: nkda Medications: BCP   Social History:Reviewed  Social History  Preferred Language: English Race:  White Ethnicity: Not Hispanic / Latino Age: [age] year Marital Status:  S Alcohol: no   Smoking Status: Never smoker reviewed on 12/29/2014 Functional Status reviewed on 12/29/2014 ------------------------------------------------ Bathing: Normal Cooking: Normal Dressing: Normal Driving: Normal Eating: Normal Managing Meds: Normal Oral Care: Normal Shopping: Normal Toileting: Normal Transferring: Normal Walking: Normal Cognitive Status reviewed on 12/29/2014 ------------------------------------------------ Attention: Normal Decision Making: Normal Language: Normal Memory: Normal Motor: Normal Perception: Normal Problem Solving: Normal Visual and Spatial: Normal   Family History:Reviewed  Family Health History Mother, Living; Healthy;  Father, Living; Healthy;     Objective  Information: General:Well appearing, well nourished in no distress. Heart:RRR, no murmur or gallop.  Normal S1, S2.  No S3, S4.  Lungs:  CTA bilaterally, no wheezes, rhonchi, rales.  Breathing unlabored. Abdomen:Soft, slightly tender in right upper quadrant to palpation, ND, no HSM, no masses. U/S of gallbladder reveals choleltihiasis, normal common bile duct Assessment:Cholellithiasis  Diagnoses: 574.20  K80.20 Gallstone (Calculus of gallbladder without cholecystitis without obstruction)  Procedures: [id number] - OFFICE OUTPATIENT NEW 30 MINUTES    Plan:  Scheduled for laparoscopic cholecystectomy on 01/04/15.   Patient Education:Alternative treatments to surgery were discussed with patient (and family).  Risks and benefits  of procedure including bleeding, infection, hepatobiliary injury, and the possibility of an open procedure were fully explained to the patient (and family) who gave informed consent. Patient/family questions were addressed.  Follow-up:Pending Surgery

## 2015-01-02 ENCOUNTER — Encounter (HOSPITAL_COMMUNITY)
Admission: RE | Admit: 2015-01-02 | Discharge: 2015-01-02 | Disposition: A | Discharge status: Home / Self Care | Payer: BLUE CROSS/BLUE SHIELD | Source: Ambulatory Visit | Attending: General Surgery | Admitting: General Surgery

## 2015-01-02 ENCOUNTER — Encounter (HOSPITAL_COMMUNITY): Payer: Self-pay

## 2015-01-02 DIAGNOSIS — E669 Obesity, unspecified: Secondary | ICD-10-CM | POA: Diagnosis not present

## 2015-01-02 DIAGNOSIS — Z6836 Body mass index (BMI) 36.0-36.9, adult: Secondary | ICD-10-CM | POA: Diagnosis not present

## 2015-01-02 DIAGNOSIS — R1011 Right upper quadrant pain: Secondary | ICD-10-CM | POA: Diagnosis present

## 2015-01-02 DIAGNOSIS — K8010 Calculus of gallbladder with chronic cholecystitis without obstruction: Secondary | ICD-10-CM | POA: Diagnosis not present

## 2015-01-02 HISTORY — DX: Anemia, unspecified: D64.9

## 2015-01-02 LAB — CBC WITH DIFFERENTIAL/PLATELET
BASOS PCT: 0 %
Basophils Absolute: 0 10*3/uL (ref 0.0–0.1)
EOS ABS: 0.1 10*3/uL (ref 0.0–0.7)
Eosinophils Relative: 2 %
HCT: 38.1 % (ref 36.0–46.0)
Hemoglobin: 12.3 g/dL (ref 12.0–15.0)
Lymphocytes Relative: 30 %
Lymphs Abs: 2.4 10*3/uL (ref 0.7–4.0)
MCH: 26.9 pg (ref 26.0–34.0)
MCHC: 32.3 g/dL (ref 30.0–36.0)
MCV: 83.2 fL (ref 78.0–100.0)
MONOS PCT: 4 %
Monocytes Absolute: 0.3 10*3/uL (ref 0.1–1.0)
NEUTROS PCT: 64 %
Neutro Abs: 5.1 10*3/uL (ref 1.7–7.7)
Platelets: 254 10*3/uL (ref 150–400)
RBC: 4.58 MIL/uL (ref 3.87–5.11)
RDW: 14.5 % (ref 11.5–15.5)
WBC: 8 10*3/uL (ref 4.0–10.5)

## 2015-01-02 LAB — BASIC METABOLIC PANEL
ANION GAP: 7 (ref 5–15)
BUN: 14 mg/dL (ref 6–20)
CO2: 26 mmol/L (ref 22–32)
CREATININE: 0.69 mg/dL (ref 0.44–1.00)
Calcium: 9.2 mg/dL (ref 8.9–10.3)
Chloride: 105 mmol/L (ref 101–111)
GFR calc Af Amer: >60 mL/min (ref >60)
GFR calc non Af Amer: >60 mL/min (ref >60)
GLUCOSE: 96 mg/dL (ref 65–99)
Potassium: 3.9 mmol/L (ref 3.5–5.1)
Sodium: 138 mmol/L (ref 135–145)

## 2015-01-02 LAB — HCG, SERUM, QUALITATIVE: PREG SERUM: NEGATIVE

## 2015-01-02 LAB — HEPATIC FUNCTION PANEL
ALBUMIN: 3.9 g/dL (ref 3.5–5.0)
ALK PHOS: 84 U/L (ref 38–126)
ALT: 31 U/L (ref 14–54)
AST: 25 U/L (ref 15–41)
Bilirubin, Direct: <0.1 mg/dL — ABNORMAL LOW (ref 0.1–0.5)
TOTAL PROTEIN: 7.1 g/dL (ref 6.5–8.1)
Total Bilirubin: 0.3 mg/dL (ref 0.3–1.2)

## 2015-01-04 ENCOUNTER — Encounter (HOSPITAL_COMMUNITY)
Admission: RE | Disposition: A | Discharge status: Home / Self Care | Payer: Self-pay | Source: Ambulatory Visit | Attending: General Surgery

## 2015-01-04 ENCOUNTER — Encounter (HOSPITAL_COMMUNITY): Payer: Self-pay | Admitting: *Deleted

## 2015-01-04 ENCOUNTER — Ambulatory Visit (HOSPITAL_COMMUNITY): Payer: BLUE CROSS/BLUE SHIELD | Admitting: Anesthesiology

## 2015-01-04 ENCOUNTER — Ambulatory Visit (HOSPITAL_COMMUNITY)
Admission: RE | Admit: 2015-01-04 | Discharge: 2015-01-04 | Disposition: A | Discharge status: Home / Self Care | Payer: BLUE CROSS/BLUE SHIELD | Source: Ambulatory Visit | Attending: General Surgery | Admitting: General Surgery

## 2015-01-04 DIAGNOSIS — E669 Obesity, unspecified: Secondary | ICD-10-CM | POA: Insufficient documentation

## 2015-01-04 DIAGNOSIS — K8010 Calculus of gallbladder with chronic cholecystitis without obstruction: Secondary | ICD-10-CM | POA: Diagnosis not present

## 2015-01-04 DIAGNOSIS — Z6836 Body mass index (BMI) 36.0-36.9, adult: Secondary | ICD-10-CM | POA: Insufficient documentation

## 2015-01-04 HISTORY — PX: CHOLECYSTECTOMY: SHX55

## 2015-01-04 SURGERY — LAPAROSCOPIC CHOLECYSTECTOMY
Anesthesia: General

## 2015-01-04 MED ORDER — FENTANYL CITRATE (PF) 250 MCG/5ML IJ SOLN
INTRAMUSCULAR | Status: AC
  Filled 2015-01-04: qty 25

## 2015-01-04 MED ORDER — ROCURONIUM BROMIDE 100 MG/10ML IV SOLN
INTRAVENOUS | Status: DC | PRN
  Administered 2015-01-04: 35 mg via INTRAVENOUS
  Administered 2015-01-04: 5 mg via INTRAVENOUS

## 2015-01-04 MED ORDER — CIPROFLOXACIN IN D5W 400 MG/200ML IV SOLN
INTRAVENOUS | Status: AC
  Filled 2015-01-04: qty 200

## 2015-01-04 MED ORDER — KETOROLAC TROMETHAMINE 30 MG/ML IJ SOLN
INTRAMUSCULAR | Status: AC
  Filled 2015-01-04: qty 1

## 2015-01-04 MED ORDER — BUPIVACAINE HCL (PF) 0.5 % IJ SOLN
INTRAMUSCULAR | Status: DC | PRN
  Administered 2015-01-04: 10 mL

## 2015-01-04 MED ORDER — NEOSTIGMINE METHYLSULFATE 10 MG/10ML IV SOLN
INTRAVENOUS | Status: AC
  Filled 2015-01-04: qty 1

## 2015-01-04 MED ORDER — KETOROLAC TROMETHAMINE 30 MG/ML IJ SOLN
30.0000 mg | Freq: Once | INTRAMUSCULAR | Status: AC
  Administered 2015-01-04: 30 mg via INTRAVENOUS

## 2015-01-04 MED ORDER — BUPIVACAINE HCL (PF) 0.5 % IJ SOLN
INTRAMUSCULAR | Status: AC
  Filled 2015-01-04: qty 30

## 2015-01-04 MED ORDER — CIPROFLOXACIN IN D5W 400 MG/200ML IV SOLN
400.0000 mg | INTRAVENOUS | Status: AC
  Administered 2015-01-04: 400 mg via INTRAVENOUS

## 2015-01-04 MED ORDER — LACTATED RINGERS IV SOLN
INTRAVENOUS | Status: DC
  Administered 2015-01-04: 09:00:00 via INTRAVENOUS

## 2015-01-04 MED ORDER — MIDAZOLAM HCL 2 MG/2ML IJ SOLN
1.0000 mg | INTRAMUSCULAR | Status: DC | PRN
  Administered 2015-01-04: 1 mg via INTRAVENOUS
  Administered 2015-01-04: 2 mg via INTRAVENOUS
  Administered 2015-01-04: 1 mg via INTRAVENOUS
  Filled 2015-01-04: qty 2

## 2015-01-04 MED ORDER — GLYCOPYRROLATE 0.2 MG/ML IJ SOLN
INTRAMUSCULAR | Status: DC | PRN
  Administered 2015-01-04: 0.6 mg via INTRAVENOUS

## 2015-01-04 MED ORDER — SODIUM CHLORIDE 0.9 % IR SOLN
Status: DC | PRN
  Administered 2015-01-04: 1000 mL

## 2015-01-04 MED ORDER — ONDANSETRON HCL 4 MG/2ML IJ SOLN
4.0000 mg | Freq: Once | INTRAMUSCULAR | Status: AC | PRN
  Administered 2015-01-04: 4 mg via INTRAVENOUS
  Filled 2015-01-04: qty 2

## 2015-01-04 MED ORDER — FENTANYL CITRATE (PF) 100 MCG/2ML IJ SOLN
INTRAMUSCULAR | Status: AC
  Filled 2015-01-04: qty 2

## 2015-01-04 MED ORDER — MIDAZOLAM HCL 2 MG/2ML IJ SOLN
INTRAMUSCULAR | Status: AC
  Filled 2015-01-04: qty 2

## 2015-01-04 MED ORDER — PROPOFOL 10 MG/ML IV BOLUS
INTRAVENOUS | Status: DC | PRN
  Administered 2015-01-04: 150 mg via INTRAVENOUS
  Administered 2015-01-04: 50 mg via INTRAVENOUS

## 2015-01-04 MED ORDER — FENTANYL CITRATE (PF) 100 MCG/2ML IJ SOLN
INTRAMUSCULAR | Status: DC | PRN
  Administered 2015-01-04 (×3): 50 ug via INTRAVENOUS
  Administered 2015-01-04: 100 ug via INTRAVENOUS

## 2015-01-04 MED ORDER — POVIDONE-IODINE 10 % OINT PACKET
TOPICAL_OINTMENT | CUTANEOUS | Status: DC | PRN
Start: 2015-01-04 — End: 2015-01-04
  Administered 2015-01-04: 1 via TOPICAL

## 2015-01-04 MED ORDER — CHLORHEXIDINE GLUCONATE 4 % EX LIQD: 1.0000 "application " | Freq: Once | CUTANEOUS | Status: DC

## 2015-01-04 MED ORDER — GLYCOPYRROLATE 0.2 MG/ML IJ SOLN
INTRAMUSCULAR | Status: AC
  Filled 2015-01-04: qty 3

## 2015-01-04 MED ORDER — NEOSTIGMINE METHYLSULFATE 10 MG/10ML IV SOLN
INTRAVENOUS | Status: DC | PRN
  Administered 2015-01-04: 4 mg via INTRAVENOUS

## 2015-01-04 MED ORDER — OXYCODONE-ACETAMINOPHEN 7.5-325 MG PO TABS: 1.0000 | ORAL_TABLET | ORAL | Status: DC | PRN

## 2015-01-04 MED ORDER — POVIDONE-IODINE 10 % EX OINT
TOPICAL_OINTMENT | CUTANEOUS | Status: AC
  Filled 2015-01-04: qty 1

## 2015-01-04 MED ORDER — LIDOCAINE HCL (PF) 1 % IJ SOLN
INTRAMUSCULAR | Status: AC
  Filled 2015-01-04: qty 5

## 2015-01-04 MED ORDER — LIDOCAINE HCL (CARDIAC) 10 MG/ML IV SOLN
INTRAVENOUS | Status: DC | PRN
  Administered 2015-01-04: 50 mg via INTRAVENOUS

## 2015-01-04 MED ORDER — FENTANYL CITRATE (PF) 100 MCG/2ML IJ SOLN
25.0000 ug | INTRAMUSCULAR | Status: DC | PRN
  Administered 2015-01-04 (×2): 50 ug via INTRAVENOUS

## 2015-01-04 MED ORDER — HEMOSTATIC AGENTS (NO CHARGE) OPTIME
TOPICAL | Status: DC | PRN
  Administered 2015-01-04: 1 via TOPICAL

## 2015-01-04 SURGICAL SUPPLY — 45 items
APPLIER CLIP LAPSCP 10X32 DD (CLIP) ×3 IMPLANT
BAG HAMPER (MISCELLANEOUS) ×3 IMPLANT
CHLORAPREP W/TINT 26ML (MISCELLANEOUS) ×3 IMPLANT
CLOTH BEACON ORANGE TIMEOUT ST (SAFETY) ×3 IMPLANT
COVER LIGHT HANDLE STERIS (MISCELLANEOUS) ×6 IMPLANT
DECANTER SPIKE VIAL GLASS SM (MISCELLANEOUS) ×3 IMPLANT
ELECT REM PT RETURN 9FT ADLT (ELECTROSURGICAL) ×3
ELECTRODE REM PT RTRN 9FT ADLT (ELECTROSURGICAL) ×1 IMPLANT
FILTER SMOKE EVAC LAPAROSHD (FILTER) ×3 IMPLANT
FORMALIN 10 PREFIL 120ML (MISCELLANEOUS) ×3 IMPLANT
GLOVE BIO SURGEON STRL SZ 6.5 (GLOVE) ×2 IMPLANT
GLOVE BIO SURGEONS STRL SZ 6.5 (GLOVE) ×1
GLOVE BIOGEL PI IND STRL 7.0 (GLOVE) ×2 IMPLANT
GLOVE BIOGEL PI IND STRL 7.5 (GLOVE) ×1 IMPLANT
GLOVE BIOGEL PI INDICATOR 7.0 (GLOVE) ×4
GLOVE BIOGEL PI INDICATOR 7.5 (GLOVE) ×2
GLOVE ECLIPSE 6.5 STRL STRAW (GLOVE) ×3 IMPLANT
GLOVE SURG SS PI 7.5 STRL IVOR (GLOVE) ×3 IMPLANT
GOWN STRL REUS W/ TWL XL LVL3 (GOWN DISPOSABLE) ×1 IMPLANT
GOWN STRL REUS W/TWL LRG LVL3 (GOWN DISPOSABLE) ×6 IMPLANT
GOWN STRL REUS W/TWL XL LVL3 (GOWN DISPOSABLE) ×2
HEMOSTAT SNOW SURGICEL 2X4 (HEMOSTASIS) ×3 IMPLANT
INST SET LAPROSCOPIC AP (KITS) ×3 IMPLANT
IV NS IRRIG 3000ML ARTHROMATIC (IV SOLUTION) IMPLANT
KIT ROOM TURNOVER APOR (KITS) ×3 IMPLANT
MANIFOLD NEPTUNE II (INSTRUMENTS) ×3 IMPLANT
NEEDLE INSUFFLATION 14GA 120MM (NEEDLE) ×3 IMPLANT
NS IRRIG 1000ML POUR BTL (IV SOLUTION) ×3 IMPLANT
PACK LAP CHOLE LZT030E (CUSTOM PROCEDURE TRAY) ×3 IMPLANT
PAD ARMBOARD 7.5X6 YLW CONV (MISCELLANEOUS) ×3 IMPLANT
POUCH SPECIMEN RETRIEVAL 10MM (ENDOMECHANICALS) ×3 IMPLANT
SET BASIN LINEN APH (SET/KITS/TRAYS/PACK) ×3 IMPLANT
SET TUBE IRRIG SUCTION NO TIP (IRRIGATION / IRRIGATOR) IMPLANT
SLEEVE ENDOPATH XCEL 5M (ENDOMECHANICALS) ×3 IMPLANT
SPONGE GAUZE 2X2 8PLY STER LF (GAUZE/BANDAGES/DRESSINGS) ×4
SPONGE GAUZE 2X2 8PLY STRL LF (GAUZE/BANDAGES/DRESSINGS) ×8 IMPLANT
STAPLER VISISTAT (STAPLE) ×3 IMPLANT
SUT VICRYL 0 UR6 27IN ABS (SUTURE) ×3 IMPLANT
TAPE CLOTH SURG 4X10 WHT LF (GAUZE/BANDAGES/DRESSINGS) ×3 IMPLANT
TROCAR ENDO BLADELESS 11MM (ENDOMECHANICALS) ×3 IMPLANT
TROCAR XCEL NON-BLD 5MMX100MML (ENDOMECHANICALS) ×3 IMPLANT
TROCAR XCEL UNIV SLVE 11M 100M (ENDOMECHANICALS) ×3 IMPLANT
TUBING INSUFFLATION (TUBING) ×3 IMPLANT
WARMER LAPAROSCOPE (MISCELLANEOUS) ×3 IMPLANT
YANKAUER SUCT 12FT TUBE ARGYLE (SUCTIONS) ×3 IMPLANT

## 2015-01-04 NOTE — Anesthesia Postprocedure Evaluation (Signed)
  Anesthesia Post-op Note  Patient: Kristina [name]  Procedure(s) Performed: Procedure(s): LAPAROSCOPIC CHOLECYSTECTOMY (N/A)  Patient Location: Short Stay  Anesthesia Type:General  Level of Consciousness: awake and alert   Airway and Oxygen Therapy: Patient Spontanous Breathing  Post-op Pain: mild  Post-op Assessment: Post-op Vital signs reviewed, Patient's Cardiovascular Status Stable, Respiratory Function Stable and Patent Airway              Post-op Vital Signs: Reviewed and stable  Last Vitals:  Filed Vitals:   01/04/15 1244  BP: 102/64  Pulse: 90  Temp: 36.7 C  Resp:     Complications: No apparent anesthesia complications. Nausea present;hortstay RN receiving orders from DR.  Benancio DeedsNewsome

## 2015-01-04 NOTE — Transfer of Care (Signed)
Immediate Anesthesia Transfer of Care Note  Patient: Kristina [name]  Procedure(s) Performed: Procedure(s): LAPAROSCOPIC CHOLECYSTECTOMY (N/A)  Patient Location: PACU  Anesthesia Type:General  Level of Consciousness: sedated and patient cooperative  Airway & Oxygen Therapy: Patient Spontanous Breathing and non-rebreather face mask  Post-op Assessment: Report given to RN, Post -op Vital signs reviewed and stable and Patient moving all extremities  Post vital signs: Reviewed and stable    Complications: No apparent anesthesia complications

## 2015-01-04 NOTE — Anesthesia Procedure Notes (Signed)
Procedure Name: Intubation Date/Time: 01/04/2015 10:27 AM Performed by: Franco NonesYATES, Claborn Janusz S Pre-anesthesia Checklist: Patient identified, Patient being monitored, Timeout performed, Emergency Drugs available and Suction available Patient Re-evaluated:Patient Re-evaluated prior to inductionOxygen Delivery Method: Circle System Utilized Preoxygenation: Pre-oxygenation with 100% oxygen Intubation Type: IV induction Ventilation: Mask ventilation without difficulty and Oral airway inserted - appropriate to patient size Laryngoscope Size: Hyacinth MeekerMiller and 2 Grade View: Grade I Tube type: Oral Tube size: 7.0 mm Number of attempts: 1 Airway Equipment and Method: Stylet Placement Confirmation: ETT inserted through vocal cords under direct vision,  positive ETCO2 and breath sounds checked- equal and bilateral Secured at: 21 cm Tube secured with: Tape Dental Injury: Teeth and Oropharynx as per pre-operative assessment

## 2015-01-04 NOTE — Discharge Instructions (Signed)
Laparoscopic Cholecystectomy, Care After °Refer to this sheet in the next few weeks. These instructions provide you with information about caring for yourself after your procedure. Your health care provider may also give you more specific instructions. Your treatment has been planned according to current medical practices, but problems sometimes occur. Call your health care provider if you have any problems or questions after your procedure. °WHAT TO EXPECT AFTER THE PROCEDURE °After your procedure, it is common to have: °· Pain at your incision sites. You will be given pain medicines to control your pain. °· Mild nausea or vomiting. This should improve after the first 24 hours. °· Bloating and possible shoulder pain from the gas that was used during the procedure. This will improve after the first 24 hours. °HOME CARE INSTRUCTIONS °Incision Care °· Follow instructions from your health care provider about how to take care of your incisions. Make sure you: °¨ Wash your hands with soap and water before you change your bandage (dressing). If soap and water are not available, use hand sanitizer. °¨ Change your dressing as told by your health care provider. °¨ Leave stitches (sutures), skin glue, or adhesive strips in place. These skin closures may need to be in place for 2 weeks or longer. If adhesive strip edges start to loosen and curl up, you may trim the loose edges. Do not remove adhesive strips completely unless your health care provider tells you to do that. °· Do not take baths, swim, or use a hot tub until your health care provider approves. Ask your health care provider if you can take showers. You may only be allowed to take sponge baths for bathing. °General Instructions °· Take over-the-counter and prescription medicines only as told by your health care provider. °· Do not drive or operate heavy machinery while taking prescription pain medicine. °· Return to your normal diet as told by your health care  provider. °· Do not lift anything that is heavier than 10 lb (4.5 kg). °· Do not play contact sports for one week or until your health care provider approves. °SEEK MEDICAL CARE IF:  °· You have redness, swelling, or pain at the site of your incision. °· You have fluid, blood, or pus coming from your incision. °· You notice a bad smell coming from your incision area. °· Your surgical incisions break open. °· You have a fever. °SEEK IMMEDIATE MEDICAL CARE IF: °· You develop a rash. °· You have difficulty breathing. °· You have chest pain. °· You have increasing pain in your shoulders (shoulder strap areas). °· You faint or have dizzy episodes while you are standing. °· You have severe pain in your abdomen. °· You have nausea or vomiting that lasts for more than one day. °  °This information is not intended to replace advice given to you by your health care provider. Make sure you discuss any questions you have with your health care provider. °  °Document Released: 02/25/2005 Document Revised: 11/16/2014 Document Reviewed: 10/07/2012 °Elsevier Interactive Patient Education ©2016 [employer] ° °

## 2015-01-04 NOTE — Anesthesia Preprocedure Evaluation (Addendum)
Anesthesia Evaluation  Patient identified by MRN, date of birth, ID band Patient awake    Reviewed: Allergy & Precautions, NPO status , Patient's Chart, lab work & pertinent test results, Unable to perform ROS - Chart review only  Airway Mallampati: II  TM Distance: >3 FB Neck ROM: Full    Dental  (+) Teeth Intact   Pulmonary neg pulmonary ROS, former smoker,    Pulmonary exam normal        Cardiovascular negative cardio ROS Normal cardiovascular exam     Neuro/Psych negative neurological ROS  negative psych ROS   GI/Hepatic negative GI ROS, Neg liver ROS,   Endo/Other  negative endocrine ROS  Renal/GU negative Renal ROS  negative genitourinary   Musculoskeletal negative musculoskeletal ROS (+)   Abdominal (+) + obese,   Peds  Hematology negative hematology ROS (+)   Anesthesia Other Findings   Reproductive/Obstetrics negative OB ROS                            Anesthesia Physical Anesthesia Plan  ASA: II  Anesthesia Plan: General   Post-op Pain Management:    Induction: Intravenous  Airway Management Planned: Oral ETT  Additional Equipment:   Intra-op Plan:   Post-operative Plan: Extubation in OR  Informed Consent: I have reviewed the patients History and Physical, chart, labs and discussed the procedure including the risks, benefits and alternatives for the proposed anesthesia with the patient or authorized representative who has indicated his/her understanding and acceptance.   Dental advisory given  Plan Discussed with: CRNA  Anesthesia Plan Comments:         Anesthesia Quick Evaluation

## 2015-01-04 NOTE — Interval H&P Note (Signed)
History and Physical Interval Note:  01/04/2015 9:32 AM  Kristina [name]  has presented today for surgery, with the diagnosis of cholelithiasis  The various methods of treatment have been discussed with the patient and family. After consideration of risks, benefits and other options for treatment, the patient has consented to  Procedure(s): LAPAROSCOPIC CHOLECYSTECTOMY (N/A) as a surgical intervention .  The patient's history has been reviewed, patient examined, no change in status, stable for surgery.  I have reviewed the patient's chart and labs.  Questions were answered to the patient's satisfaction.     Franky MachoJENKINS,Turrell Severt A

## 2015-01-04 NOTE — Op Note (Signed)
Patient:  Kristina [name]  DOB:  March 27, 1990  MRN:  [id number]   Preop Diagnosis:   Biliary colic, cholelithiasis  Postop Diagnosis:   same  Procedure:   Laparoscopic cholecystectomy  Surgeon:   Franky MachoMark Brandee Markin, M.D.  Anes:   General endotracheal  Indications:   Patient is a 24 year old white female who presents with biliary colic secondary to cholelithiasis. The risks and benefits of the procedure including bleeding, infection, hepatobiliary injury, and the possibility of an open procedure were fully explained to the patient, who gave informed consent.  Procedure note:   The patient was placed the supine position. After induction of general endotracheal anesthesia, the abdomen was prepped and draped using the usual sterile technique with DuraPrep. Surgical site confirmation was performed.  A supraumbilical incision was made down to the fascia. A Veress needle was introduced into the abdominal cavity and confirmation of placement was done using the saline drop test. The abdomen was then insufflated to 16 mmHg pressure. An 11 mm trocar was introduced into the abdominal cavity under direct visualization without difficulty. The patient was placed in reverse Trendelenburg position and an additional 11 mm trocar was placed the epigastric region and 5 mm trochars were placed the right upper quadrant and right flank regions. Liver was inspected and noted to be within normal limits. The gallbladder was retracted in a dynamic fashion in order to get a critical view of the triangle of Calot. The cystic duct was first identified. Its junction to the infundibulum was fully identified. Endoclips were placed proximally and distally on the cystic duct, and the cystic duct was divided. This was likewise done cystic artery. The gallbladder was freed away from the gallbladder fossa using Bovie electrocautery. The gallbladder was delivered to the epigastric trocar site using an Endo Catch bag. The gallbladder fossa was  inspected and no abnormal bleeding or bile leakage was noted. Surgicel is placed the gallbladder fossa. All fluid and air were then evacuated from the abdominal cavity prior to removal of the trochars.  All wounds were irrigated with normal saline. All wounds were checked with 0.5% Sensorcaine. The supraumbilical fascia was reapproximated using an 0 Vicryl interrupted suture. All skin incisions were closed using staples. Betadine ointment and dry sterile dressings were applied.  All tape and needle counts were correct at the end of the procedure. Patient was extubated in the operating room and transferred to PACU in stable condition.  Complications:   none  EBL:   minimal  Specimen:   gallbladder

## 2015-01-05 ENCOUNTER — Encounter (HOSPITAL_COMMUNITY): Payer: Self-pay | Admitting: General Surgery

## 2015-02-13 ENCOUNTER — Encounter: Payer: Self-pay | Admitting: Women's Health

## 2015-02-13 ENCOUNTER — Ambulatory Visit (INDEPENDENT_AMBULATORY_CARE_PROVIDER_SITE_OTHER): Payer: BLUE CROSS/BLUE SHIELD | Admitting: Women's Health

## 2015-02-13 VITALS — BP 122/62 | HR 76 | Wt 255.0 lb

## 2015-02-13 DIAGNOSIS — Z3041 Encounter for surveillance of contraceptive pills: Secondary | ICD-10-CM | POA: Diagnosis not present

## 2015-02-13 MED ORDER — ETONOGESTREL-ETHINYL ESTRADIOL 0.12-0.015 MG/24HR VA RING: VAGINAL_RING | VAGINAL | Status: DC

## 2015-02-13 NOTE — Patient Instructions (Signed)

## 2015-02-13 NOTE — Progress Notes (Signed)
Patient ID: Kristina [name] Borin, female   DOB: 04-28-1990, 24 y.o.   MRN: [id number]   [facility]  Patient name: Kristina [name] Pulcini MRN [id number]  Date of birth: 04-28-1990  CC & HPI:  Kristina [name] Marrin is a 24 y.o. [id number] Caucasian female presenting today for COC f/u on Lo Loestrin, but can't remember to take them, hasn't taken them in 'awhile', but period just started 4d ago. Wants to switch to [employer].    Patient's last menstrual period was 02/09/2015.  Last pap 03/2014 and was neg  Pertinent History Reviewed:  Medical & Surgical Hx:   Past Medical History  Diagnosis Date  . Medical history non-contributory   . Pregnant   . Rh negative state in antepartum period   . Vaginal Pap smear, abnormal   . Anemia    Past Surgical History  Procedure Laterality Date  . Tonsillectomy    . Cholecystectomy N/A 01/04/2015    Procedure: LAPAROSCOPIC CHOLECYSTECTOMY;  Surgeon: Franky MachoMark Jenkins, MD;  Location: [facility];  Service: General;  Laterality: N/A;   Medications: Reviewed & Updated - see associated section Social History: Reviewed -  reports that she quit smoking about 2 years ago. Her smoking use included Cigarettes. She has a 4.5 pack-year smoking history. She has never used smokeless tobacco.  Objective Findings:  Vitals: BP 122/62 mmHg  Pulse 76  Wt 255 lb (115.667 kg)  LMP 02/09/2015  Breastfeeding? No Body mass index is 38.78 kg/(m^2).  Physical Examination: General appearance - alert, well appearing, and in no distress  No results found for this or any previous visit (from the past 24 hour(s)).   Assessment & Plan:  A:   Contraception management  P:  Stop Lo Loestrin, Rx Nuva Ring  F/U Jan for physical  Marge DuncansBooker, Kimberly Randall CNM, [facility] 26/07/2014 10:52 AM

## 2015-02-15 ENCOUNTER — Ambulatory Visit: Payer: BLUE CROSS/BLUE SHIELD | Admitting: Women's Health

## 2015-03-21 ENCOUNTER — Encounter: Payer: Self-pay | Admitting: Women's Health

## 2015-03-21 ENCOUNTER — Other Ambulatory Visit: Payer: BLUE CROSS/BLUE SHIELD | Admitting: Women's Health

## 2015-05-22 IMAGING — US US PELVIS COMPLETE
1 series · 13 of 25 positions shown · non-contrast
Comparison: None.

CLINICAL DATA: Abdominal pain postpartum 12 days.

EXAM:
TRANSABDOMINAL AND TRANSVAGINAL ULTRASOUND OF PELVIS
DOPPLER ULTRASOUND OF OVARIES
TECHNIQUE: Both transabdominal and transvaginal ultrasound examinations of the
pelvis were performed. Transabdominal technique was performed for
global imaging of the pelvis including uterus, ovaries, adnexal
regions, and pelvic cul-de-sac.
It was necessary to proceed with endovaginal exam following the
transabdominal exam to visualize the ovaries. Color and duplex
Doppler ultrasound was utilized to evaluate blood flow to the
ovaries.

[Series 1: us pelvis complete · 0.24mm/px · 13 of 52 slices shown]
[im 1/52]
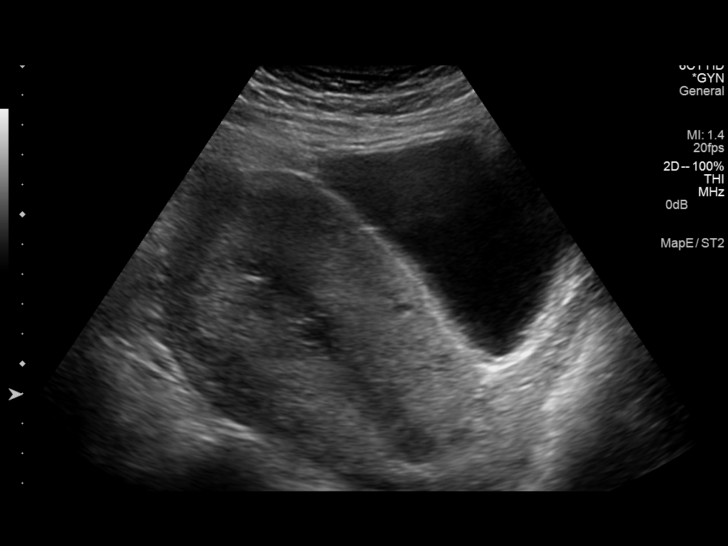
[im 5/52]
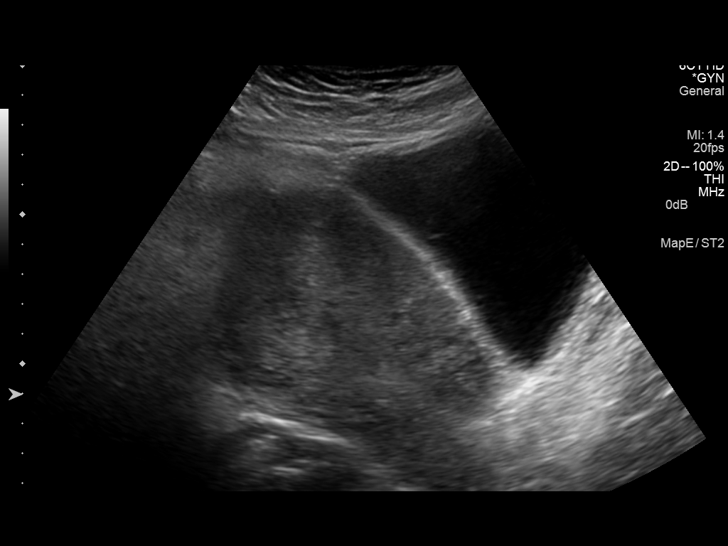
[im 9/52]
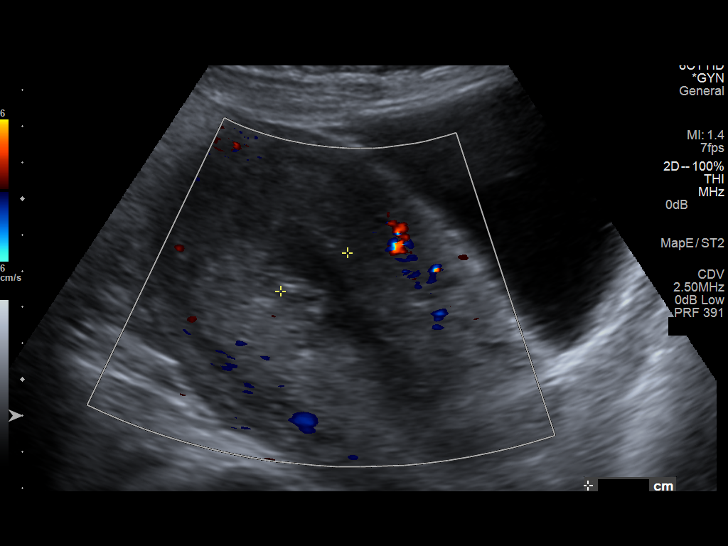
[im 13/52]
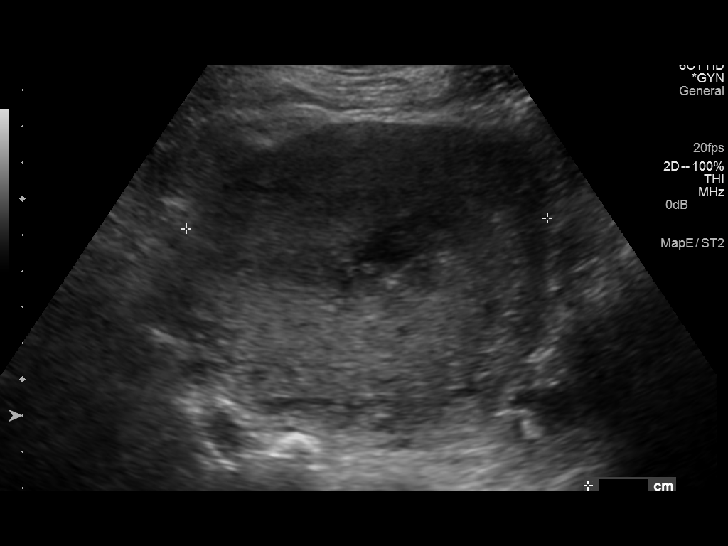
[im 18/52]
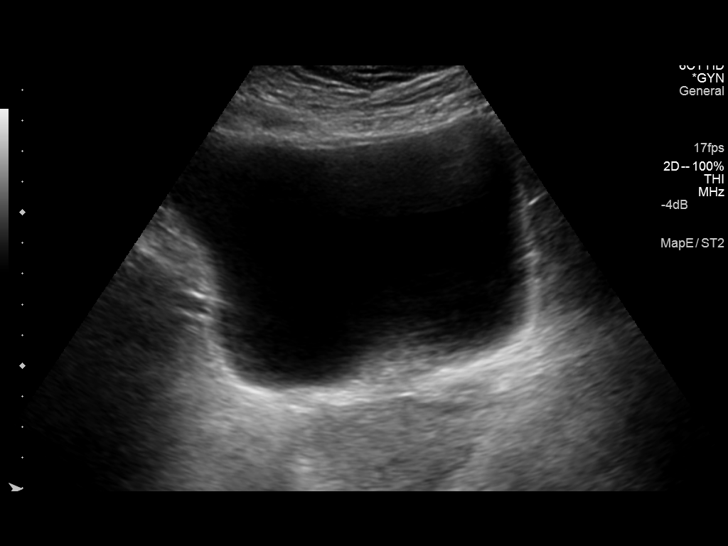
[im 22/52]
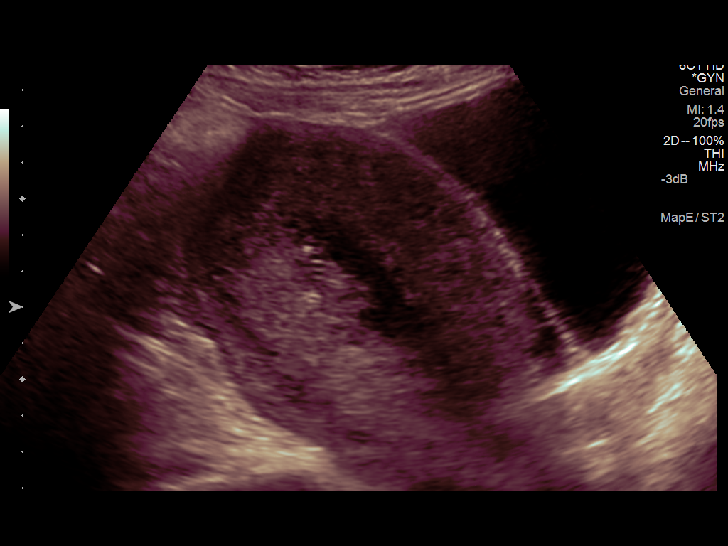
[im 26/52]
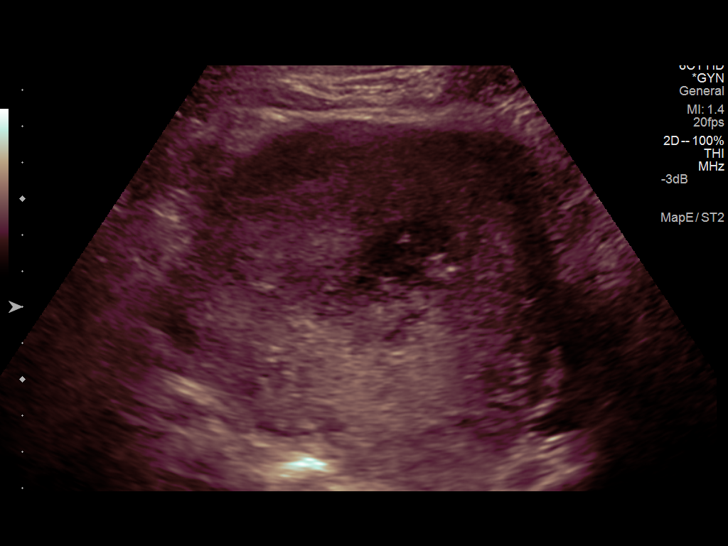
[im 30/52]
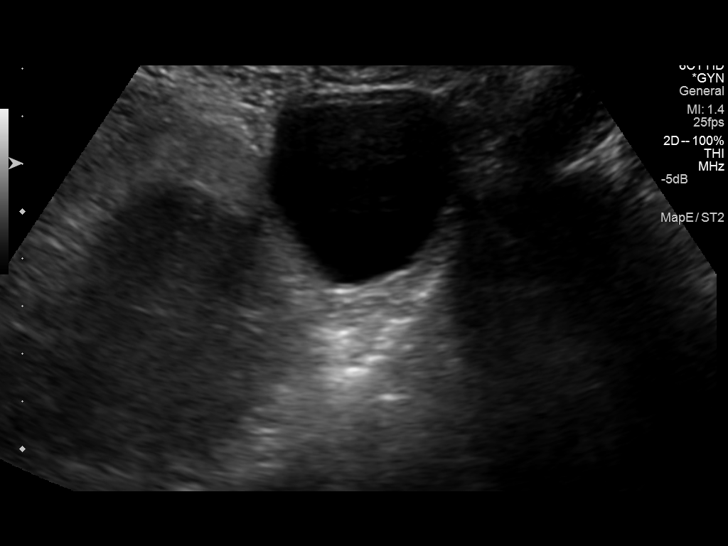
[im 35/52]
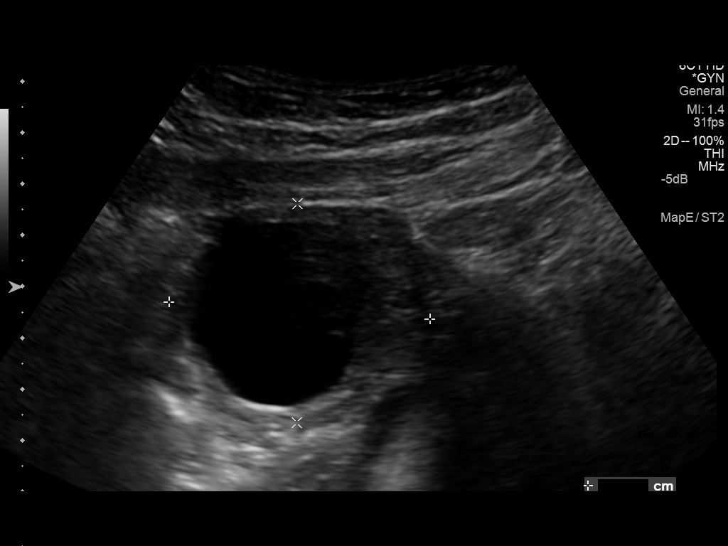
[im 39/52]
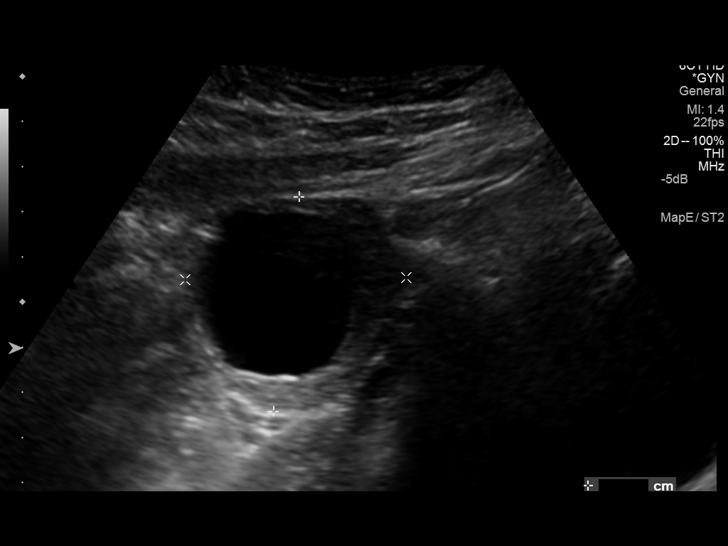
[im 43/52]
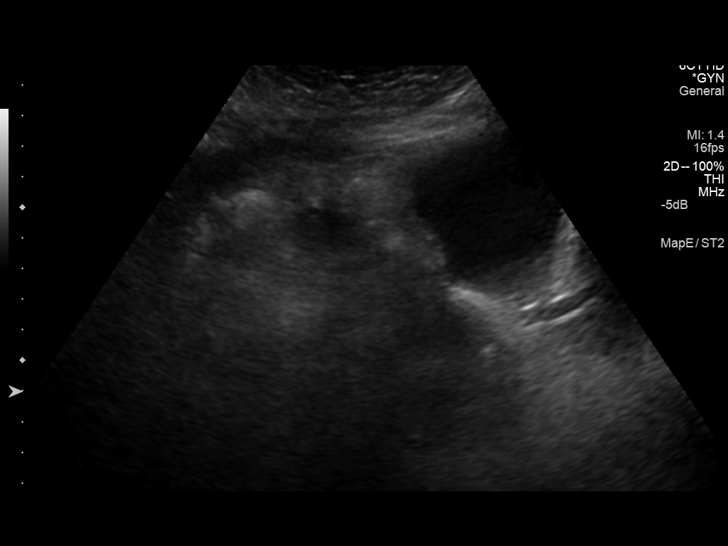
[im 47/52]
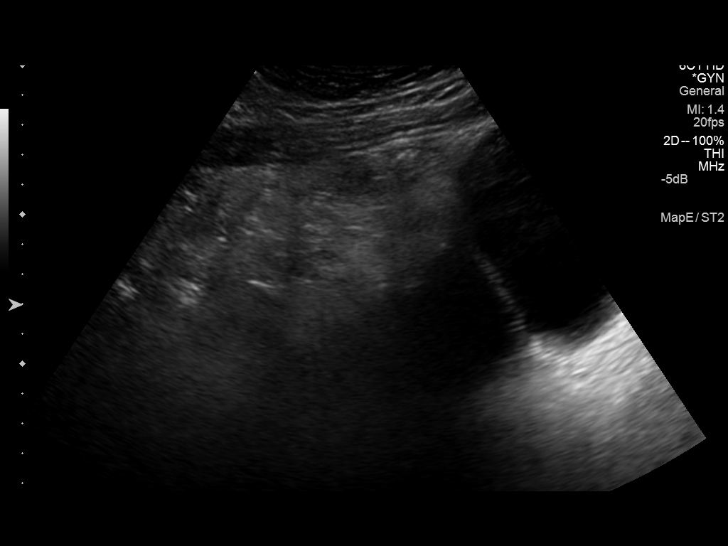
[im 52/52]
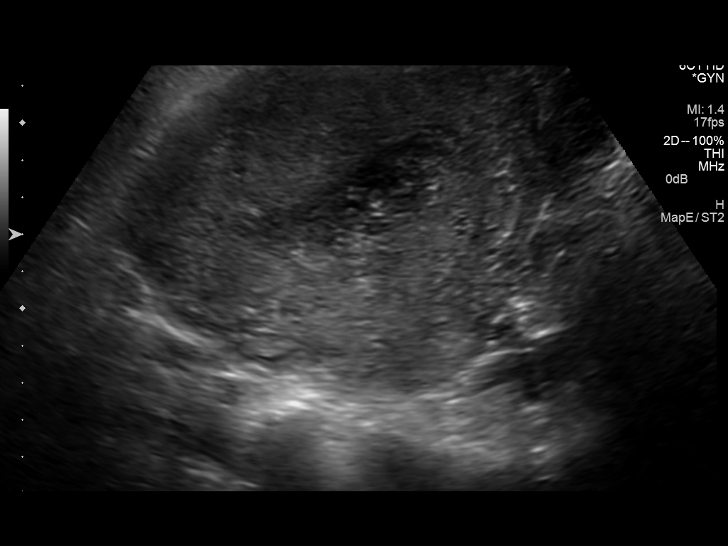

[13 of 25 positions shown; findings below may reference images not displayed]

FINDINGS: Uterus

Measurements: 15 x 10 x 8 cm, normal for postpartum state. There is
focal thickening of the posterior endometrium measuring
approximately 25 mm length, outlined by hypoechoic fluid. No
internal color Doppler flow is noted.

Right ovary

Not visible, but normal by preceding CT.

Left ovary

Measurements: 4.8 x 4.9 x 5.1 cm. There is a simple appearing 4 cm
cyst. Normal venous flow and weak arterial signal was documented.
There is no morphologic change to suggest torsion.
IMPRESSION: 1. Focal endometrial thickening suspicious for retained products of
conception.
2. 4 cm left ovarian simple cyst.
3. Technically challenge Doppler of the left ovary, but no
morphologic evidence of torsion.
4. Right ovary not visible, but normal on earlier CT.

## 2015-05-22 IMAGING — CT CT ABD-PELV W/ CM
1 of 2 series · 15 of 32 positions shown, 19 images · IV contrast (OMNIPAQUE 300)
Comparison: None.

CLINICAL DATA: Right lower quadrant abdominal pain since last
night. Recent postpartum June 10, 2014, 11 days prior. Persistent
vaginal bleeding post vaginal delivery.

EXAM:
CT ABDOMEN AND PELVIS WITH CONTRAST
TECHNIQUE: Multidetector CT imaging of the abdomen and pelvis was performed
using the standard protocol following bolus administration of
intravenous contrast.
CONTRAST:  100mL OMNIPAQUE IOHEXOL 300 MG/ML SOLN, 25mL OMNIPAQUE
IOHEXOL 300 MG/ML SOLN

[Series 2: abd/pel with · axial · 0.83mm/px · z∈[-813,-383]mm · 15 of 94 slices shown, 19 images]
[im 4/94  soft-tissue]
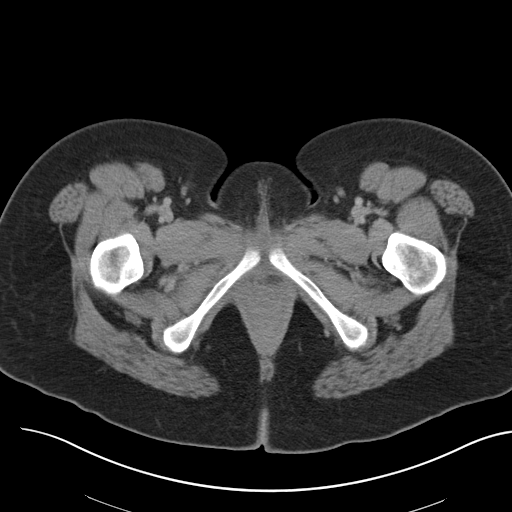
[im 4/94  bone]
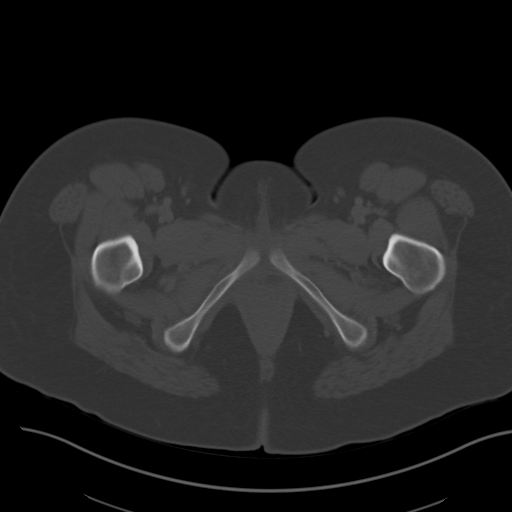
[im 12/94  soft-tissue]
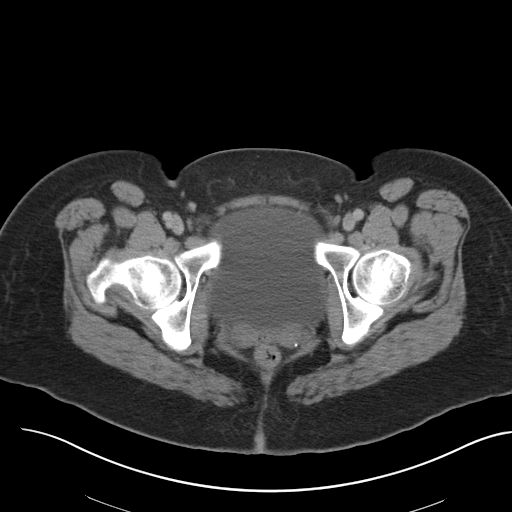
[im 20/94  soft-tissue]
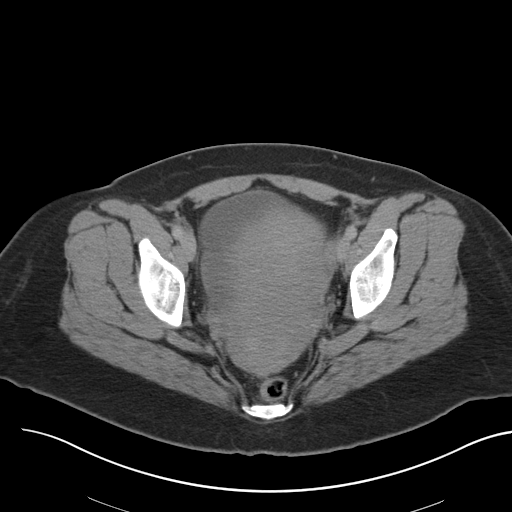
[im 28/94  soft-tissue]
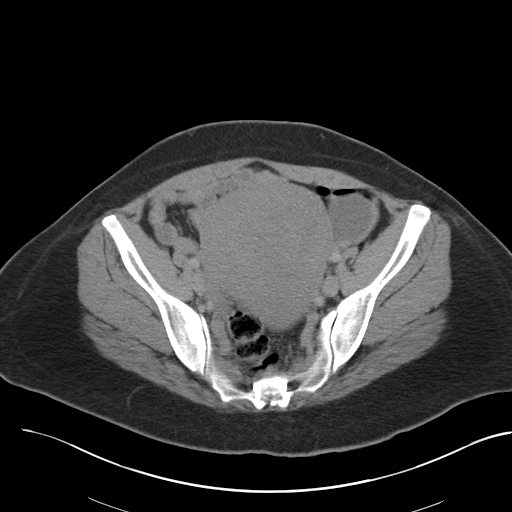
[im 32/94  soft-tissue]
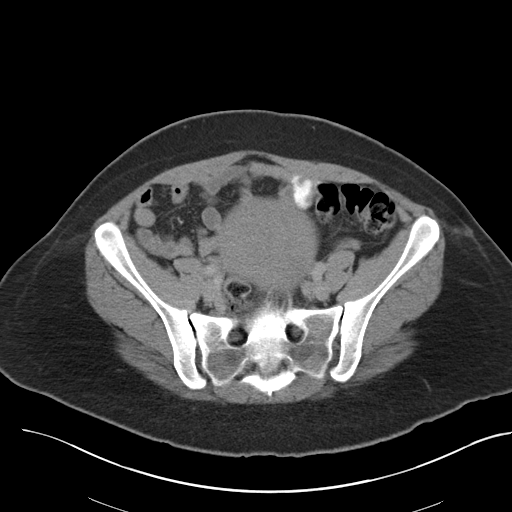
[im 39/94  soft-tissue]
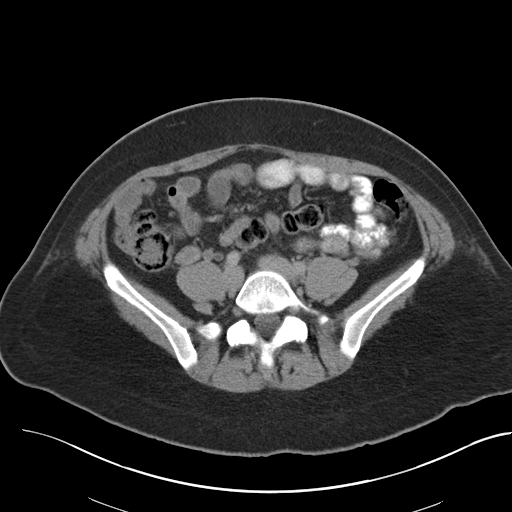
[im 47/94  soft-tissue]
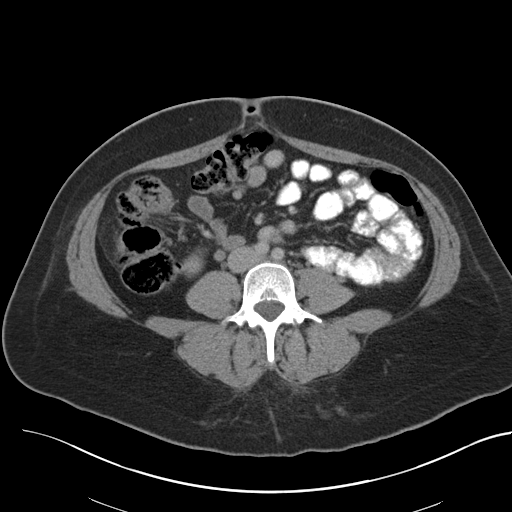
[im 55/94  soft-tissue]
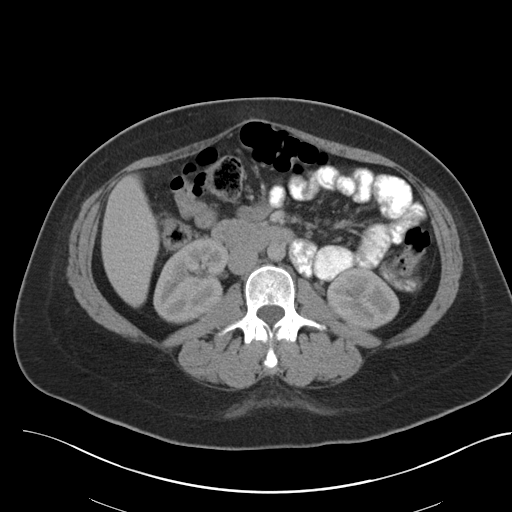
[im 63/94  soft-tissue]
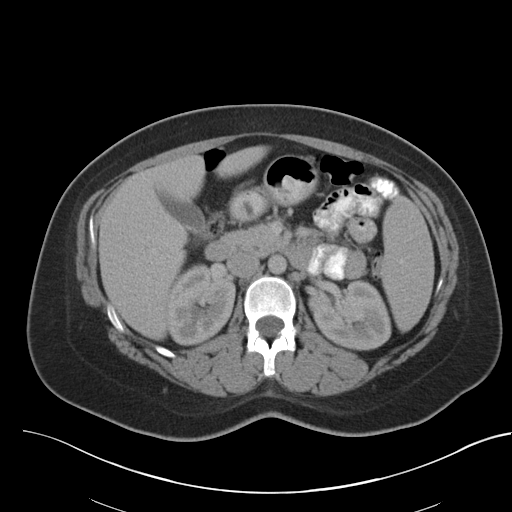
[im 63/94  bone]
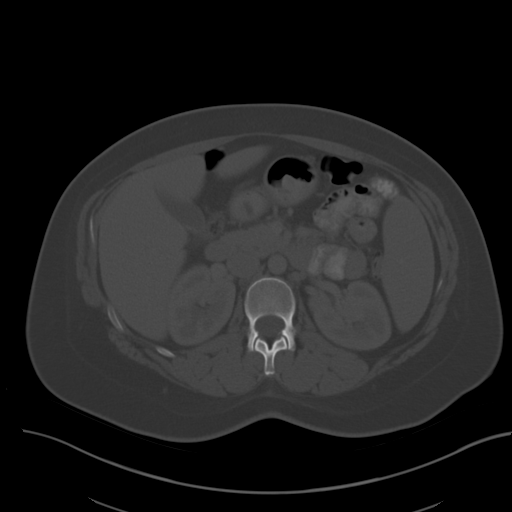
[im 66/94  soft-tissue]
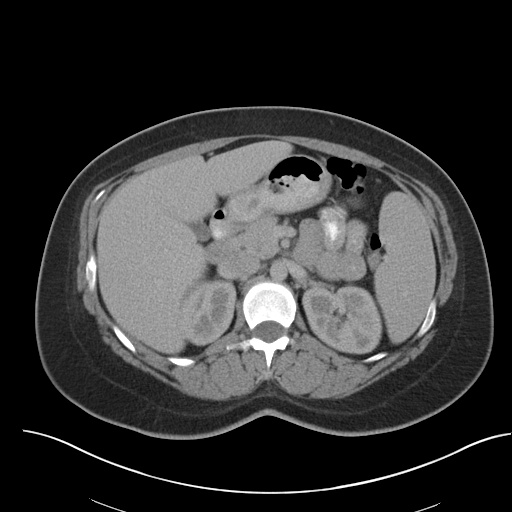
[im 74/94  soft-tissue]
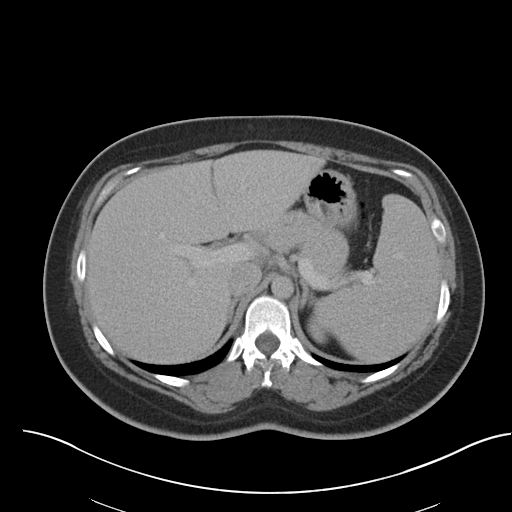
[im 78/94  lung]
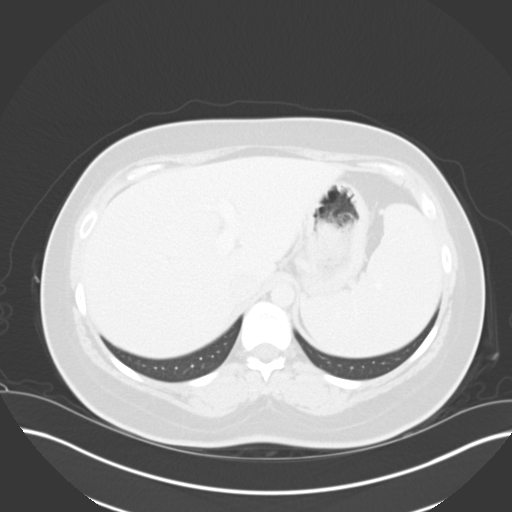
[im 82/94  soft-tissue]
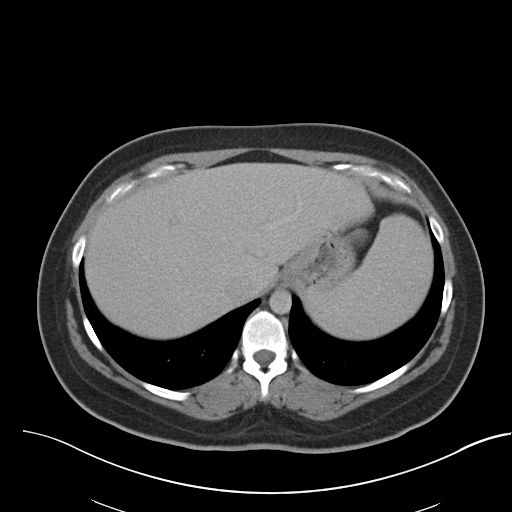
[im 82/94  lung]
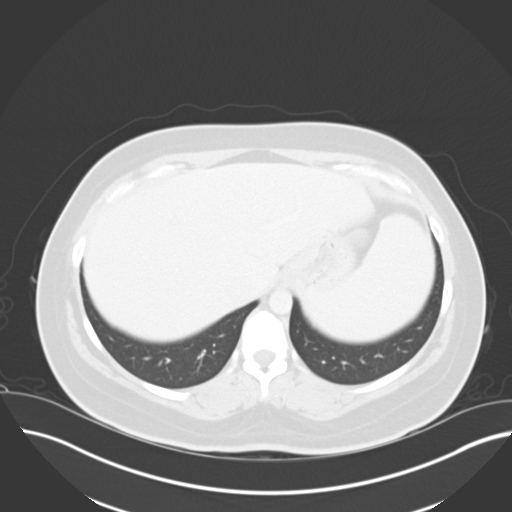
[im 86/94  lung]
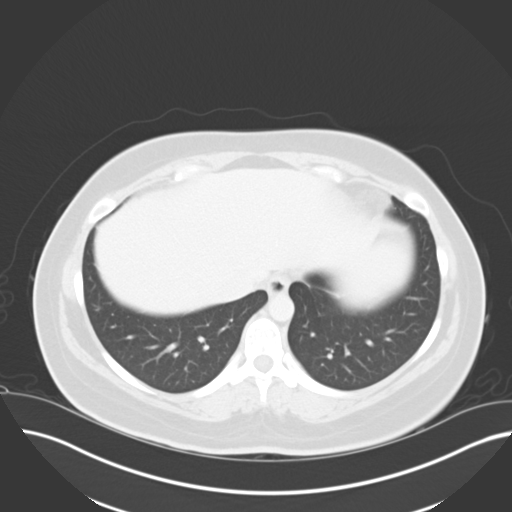
[im 90/94  soft-tissue]
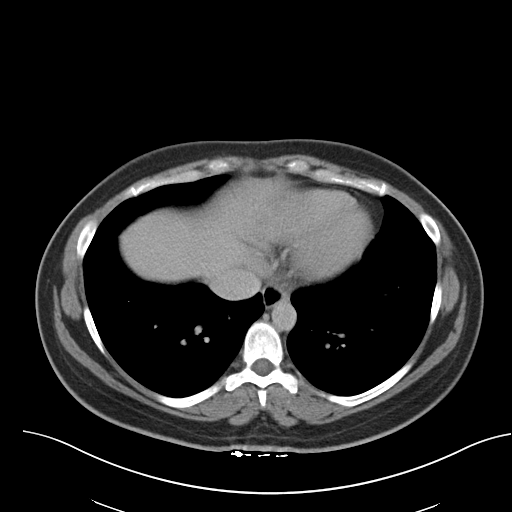
[im 90/94  lung]
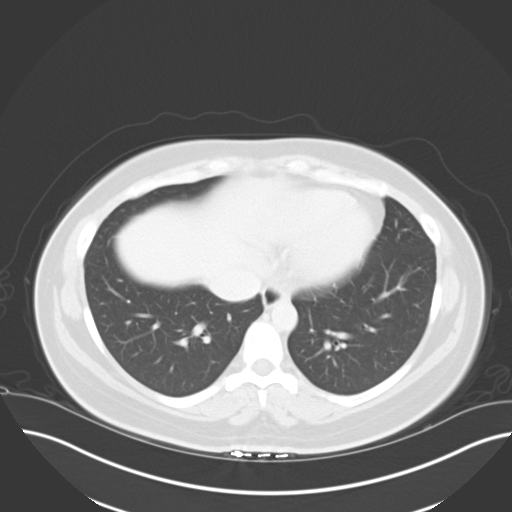

[15 of 32 positions shown; findings below may reference images not displayed]

FINDINGS: The included lung bases are clear.

Liver is homogeneous without focal hepatic lesion. The gallbladder,
pancreas, and adrenal glands are normal. Homogeneous attenuation
throughout the spleen which is enlarged measuring 15.7 x 14.0 x
cm, volume of 757 mL.

Kidneys demonstrate symmetric enhancement without hydronephrosis or
perinephric stranding. No focal renal abnormality. There is question
of mild right ureteric enhancement and prominence in the midportion,
no obstructing stone. Bladder is physiologically distended.

Stomach is physiologically distended. There are no dilated or
thickened bowel loops. The appendix is normal. Small to moderate
volume of colonic stool. No colonic wall thickening. Abdominal aorta
is normal in caliber. No retroperitoneal adenopathy. Tiny fat
containing umbilical hernia.

Enlarged postpartum appearance of the uterus. There is a 4.0 cm cyst
in the left ovary, similar to prior obstetric ultrasound February 23, 2014. Right ovary is tentatively identified. There is no
significant pelvic free fluid. Pelvic phleboliths are noted.

Sclerosis and vacuum phenomenon noted about the right sacroiliac
joint. There scattered bone islands in the pelvis. Pubic symphysis
is congruent.
IMPRESSION: 1. Question of mild right ureteric enhancement, may reflect urinary
tract infection. No urolithiasis. Correlation with urinalysis
recommended.
2. Normal appendix.
3. Postpartum appearance of the uterus.
4. Smooth splenomegaly.
5. Simple left ovarian cyst, similar to prior ultrasound.

## 2015-06-27 ENCOUNTER — Encounter: Payer: Self-pay | Admitting: Women's Health

## 2015-08-08 ENCOUNTER — Ambulatory Visit (INDEPENDENT_AMBULATORY_CARE_PROVIDER_SITE_OTHER): Payer: BLUE CROSS/BLUE SHIELD | Admitting: Women's Health

## 2015-08-08 ENCOUNTER — Encounter: Payer: Self-pay | Admitting: Women's Health

## 2015-08-08 VITALS — BP 138/70 | HR 92 | Wt 250.0 lb

## 2015-08-08 DIAGNOSIS — Z113 Encounter for screening for infections with a predominantly sexual mode of transmission: Secondary | ICD-10-CM | POA: Diagnosis not present

## 2015-08-08 DIAGNOSIS — N9089 Other specified noninflammatory disorders of vulva and perineum: Secondary | ICD-10-CM

## 2015-08-08 LAB — POCT WET PREP (WET MOUNT): Clue Cells Wet Prep Whiff POC: POSITIVE

## 2015-08-08 MED ORDER — VALACYCLOVIR HCL 1 G PO TABS: 1000.0000 mg | ORAL_TABLET | Freq: Two times a day (BID) | ORAL | Status: DC

## 2015-08-08 MED ORDER — METRONIDAZOLE 500 MG PO TABS: 500.0000 mg | ORAL_TABLET | Freq: Two times a day (BID) | ORAL | Status: DC

## 2015-08-08 NOTE — Patient Instructions (Signed)

## 2015-08-08 NOTE — Progress Notes (Signed)
Patient ID: Hughes BetterJanie R Giambra, female   DOB: 12-Sep-1990, 25 y.o.   MRN: [id number]   [facility]  Patient name: Hughes BetterJanie R Hoehn MRN [id number]  Date of birth: 12-Sep-1990  CC & HPI:  Hughes BetterJanie R Shugars is a 25 y.o. [id number] Caucasian female presenting today requesting std screening. Just found out husband was cheating on her. Had painful sores pop up in vulvar area around same time she found out. Denies abnormal d/c, itching/odor. Stopped nuva ring b/c prior to this husband said he wanted another baby, now pt has changed her mind and doesn't want anymore babies- is interested in mirena. Has decided to give husband a 2nd chance and stay w/ him. Hx of abnormal pap- was due for physical/pap in Jan.  Patient's last menstrual period was 07/31/2015.  Pertinent History Reviewed:  Medical & Surgical Hx:   Past medical, surgical, family, and social history reviewed in electronic medical record Medications: Reviewed & Updated - see associated section Allergies: Reviewed in electronic medical record  Objective Findings:  Vitals: BP 138/70 mmHg  Pulse 92  Wt 250 lb (113.399 kg)  LMP 07/31/2015 Body mass index is 38.02 kg/(m^2).  Physical Examination: General appearance - alert, well appearing, and in no distress Pelvic - ulcerated lesions c/w HSV perineal area- culture obtained Spec exam: cx erythematous and friable, small amt thin yellow malodorous d/c, gc/ct and wet prep obtained  Results for orders placed or performed in visit on 08/08/15 (from the past 24 hour(s))  POCT Wet Prep Mellody Drown(Wet [place])   Collection Time: 08/08/15 11:26 AM  Result Value Ref Range   Source Wet Prep POC vaginal    WBC, Wet Prep HPF POC many    Bacteria Wet Prep HPF POC None None, Few, Too numerous to count   BACTERIA WET PREP MORPHOLOGY POC     Clue Cells Wet Prep HPF POC Moderate (A) None, Too numerous to count   Clue Cells Wet Prep Whiff POC Positive Whiff    Yeast Wet Prep HPF POC None    KOH Wet Prep POC      Trichomonas Wet Prep HPF POC none      Assessment & Plan:  A:   BV  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

## 2015-08-09 ENCOUNTER — Telehealth: Payer: Self-pay | Admitting: Women's Health

## 2015-08-09 DIAGNOSIS — A6000 Herpesviral infection of urogenital system, unspecified: Secondary | ICD-10-CM | POA: Insufficient documentation

## 2015-08-09 LAB — HSV(HERPES SIMPLEX VRS) I + II AB-IGG
HSV 1 Glycoprotein G Ab, IgG: <0.91 index (ref 0.00–0.90)
HSV 2 GLYCOPROTEIN G AB, IGG: 11.3 {index} — AB (ref 0.00–0.90)

## 2015-08-09 LAB — RPR: RPR Ser Ql: NONREACTIVE

## 2015-08-09 LAB — HEPATITIS B SURFACE ANTIGEN: Hepatitis B Surface Ag: NEGATIVE

## 2015-08-09 LAB — HIV ANTIBODY (ROUTINE TESTING W REFLEX): HIV SCREEN 4TH GENERATION: NONREACTIVE

## 2015-08-09 NOTE — Telephone Encounter (Signed)
Notified pt of +HSV2 serum, culture not back yet, notified of rest of normal labs. Gc/ct not back yet. States she's starting to feel better already w/ valtrex.  Cheral MarkerKimberly R. Smitty Ackerley, CNM, [facility]NP-BC 08/09/2015 1:29 PM

## 2015-08-10 LAB — GC/CHLAMYDIA PROBE AMP
Chlamydia trachomatis, NAA: NEGATIVE
NEISSERIA GONORRHOEAE BY PCR: NEGATIVE

## 2015-08-11 LAB — HERPES SIMPLEX VIRUS CULTURE

## 2015-08-22 ENCOUNTER — Other Ambulatory Visit: Payer: BLUE CROSS/BLUE SHIELD | Admitting: Women's Health

## 2015-08-28 ENCOUNTER — Other Ambulatory Visit: Payer: BLUE CROSS/BLUE SHIELD | Admitting: Women's Health

## 2015-09-05 ENCOUNTER — Encounter: Payer: Self-pay | Admitting: Women's Health

## 2015-09-05 ENCOUNTER — Ambulatory Visit (INDEPENDENT_AMBULATORY_CARE_PROVIDER_SITE_OTHER): Payer: BLUE CROSS/BLUE SHIELD | Admitting: Women's Health

## 2015-09-05 ENCOUNTER — Other Ambulatory Visit (HOSPITAL_COMMUNITY)
Admission: RE | Admit: 2015-09-05 | Discharge: 2015-09-05 | Disposition: A | Discharge status: Home / Self Care | Payer: BLUE CROSS/BLUE SHIELD | Source: Ambulatory Visit | Attending: Obstetrics & Gynecology | Admitting: Obstetrics & Gynecology

## 2015-09-05 VITALS — BP 138/78 | HR 80 | Ht 67.25 in | Wt 250.0 lb

## 2015-09-05 DIAGNOSIS — Z01419 Encounter for gynecological examination (general) (routine) without abnormal findings: Secondary | ICD-10-CM

## 2015-09-05 DIAGNOSIS — Z124 Encounter for screening for malignant neoplasm of cervix: Secondary | ICD-10-CM

## 2015-09-05 DIAGNOSIS — Z01411 Encounter for gynecological examination (general) (routine) with abnormal findings: Secondary | ICD-10-CM | POA: Diagnosis present

## 2015-09-05 DIAGNOSIS — E669 Obesity, unspecified: Secondary | ICD-10-CM | POA: Insufficient documentation

## 2015-09-05 DIAGNOSIS — Z113 Encounter for screening for infections with a predominantly sexual mode of transmission: Secondary | ICD-10-CM | POA: Insufficient documentation

## 2015-09-05 DIAGNOSIS — R519 Headache, unspecified: Secondary | ICD-10-CM

## 2015-09-05 DIAGNOSIS — R51 Headache: Secondary | ICD-10-CM

## 2015-09-05 NOTE — Progress Notes (Signed)
Patient ID: Kristina BetterJanie R [name], female   DOB: 05-Jun-1990, 25 y.o.   MRN: [id number] Subjective:   Kristina [name] is a 25 y.o. 523P2012 Caucasian female here for a routine well-woman exam.  Patient's last menstrual period was 08/27/2015.    Current complaints: daily headaches on top/crown of head, takes ibuprofen 800mg  which helps. Wants IUD, not currently using any contraception/condoms. Last sex 09/01/15. Discussed options of when she could come in to have placed- she wants to come in when next period starts.  PCP: none       Does desire labs  Social History: Sexual: heterosexual Marital Status: married Living situation: with spouse Occupation: unemployed Tobacco/alcohol: no tobacco, etoh occ Illicit drugs: no history of illicit drug use  The following portions of the patient's history were reviewed and updated as appropriate: allergies, current medications, past family history, past medical history, past social history, past surgical history and problem list.  Past Medical History Past Medical History  Diagnosis Date  . Medical history non-contributory   . Pregnant   . Rh negative state in antepartum period   . Vaginal Pap smear, abnormal   . Anemia     Past Surgical History Past Surgical History  Procedure Laterality Date  . Tonsillectomy    . Cholecystectomy N/A 01/04/2015    Procedure: LAPAROSCOPIC CHOLECYSTECTOMY;  Surgeon: Franky MachoMark Jenkins, MD;  Location: [facility];  Service: General;  Laterality: N/A;    Gynecologic History [id number]  Patient's last menstrual period was 08/27/2015. Contraception: none, wants IUD Last Pap: 03/26/14. Results were: normal, however she has h/o abnormal, and found out in May husband had been cheating on her, so will do pap today Last mammogram: never. Results were: n/a Last TCS: never  Obstetric History OB History  Gravida Para Term Preterm AB SAB TAB Ectopic Multiple Living  3 2 2  1 1    0 2    # Outcome Date GA Lbr Len/2nd Weight Sex Delivery  Anes PTL Lv  3 Term 06/10/14 3679w1d 06:40 / 00:19 8 lb 1.5 oz (3.67 kg) M Vag-Spont EPI  Y  2 SAB 05/08/13 684w0d  3 oz (0.085 kg)   None       Comments: Delivered at home at 6125w0d, but fetus had been deceased for a few weeks/appeared to be 16-17wk size  1 Term 12/09/07 8067w6d  7 lb 10 oz (3.459 kg) F Vag-Spont EPI N Y      Current Medications Current Outpatient Prescriptions on File Prior to Visit  Medication Sig Dispense Refill  . etonogestrel-ethinyl estradiol (NUVARING) 0.12-0.015 MG/24HR vaginal ring Insert vaginally and leave in place for 3 consecutive weeks, then remove for 1 week. (Patient not taking: Reported on 08/08/2015) 1 each 12  . metroNIDAZOLE (FLAGYL) 500 MG tablet Take 1 tablet (500 mg total) by mouth 2 (two) times daily. X 7 days. No sex or alcohol while taking (Patient not taking: Reported on 09/05/2015) 14 tablet 0  . Norethindrone-Ethinyl Estradiol-Fe Biphas (LO LOESTRIN FE) 1 MG-10 MCG / 10 MCG tablet Take 1 tablet by mouth daily. (Patient not taking: Reported on 02/13/2015) 1 Package 11  . oxyCODONE-acetaminophen (PERCOCET) 7.5-325 MG tablet Take 1-2 tablets by mouth every 4 (four) hours as needed. (Patient not taking: Reported on 02/13/2015) 50 tablet 0  . valACYclovir (VALTREX) 1000 MG tablet Take 1 tablet (1,000 mg total) by mouth 2 (two) times daily. X 10 days (Patient not taking: Reported on 09/05/2015) 20 tablet 0   No current facility-administered medications on file prior  to visit.    Review of Systems Patient denies any headaches, blurred vision, shortness of breath, chest pain, abdominal pain, problems with bowel movements, urination, or intercourse.  Objective:  BP 138/78 mmHg  Pulse 80  Ht 5' 7.25" (1.708 m)  Wt 250 lb (113.399 kg)  BMI 38.87 kg/m2  LMP 08/27/2015 Physical Exam  General:  Well developed, well nourished, no acute distress. She is alert and oriented x3. Skin:  Warm and dry Neck:  Midline trachea, no thyromegaly or nodules Cardiovascular:  Regular rate and rhythm, no murmur heard Lungs:  Effort normal, all lung fields clear to auscultation bilaterally Breasts:  No dominant palpable mass, retraction, or nipple discharge Abdomen:  Soft, non tender, no hepatosplenomegaly or masses Pelvic:  External genitalia is normal in appearance.  The vagina is normal in appearance. The cervix is bulbous, no CMT.  Thin prep pap is done w/ reflex HR HPV cotesting. Uterus is felt to be normal size, shape, and contour.  No adnexal masses or tenderness noted. Extremities:  No swelling or varicosities noted Psych:  She has a normal mood and affect  Assessment:   Healthy well-woman exam Daily headaches Obesity Contraception counseling  Plan:  Advised weight loss, decrease carbs/increase exercise Decrease ibuprofen Gave printed info for headache prevention/relief Will come back one day this week for fasting labs: CBC, CMP, TSH, A1C, Lipid panel F/U: will call when period starts for IUD placement, or sooner if needed Mammogram @27yo  or sooner if problems Colonoscopy @25yo  or sooner if problems  Marge DuncansBooker, Damyia Strider Randall CNM, [facility]NP[facility]BC 09/05/2015 12:51 PM

## 2015-09-05 NOTE — Addendum Note (Signed)
Addended by: Gaylyn RongEVANS, Ahmad Vanwey A on: 09/05/2015 01:33 PM   Modules accepted: Orders

## 2015-09-05 NOTE — Patient Instructions (Signed)
Call us when your period starts to get IUD placed  For Headaches:   Stay well hydrated, drink enough water so that your urine is clear, sometimes if you are dehydrated you can get headaches  Eat small frequent meals and snacks, sometimes if you are hungry you can get headaches  Sometimes you get headaches during pregnancy from the pregnancy hormones  You can try tylenol (1-2 regular strength 325mg  or 1-2 extra strength 500mg ) as directed on the box. The least amount of medication that works is best.   Cool compresses (cool wet washcloth or ice pack) to area of head that is hurting  You can also try drinking a caffeinated drink to see if this will help  If not helping, try below:  For Prevention of Headaches/Migraines:  CoQ10 100mg  three times daily  Vitamin B2 400mg  daily  Magnesium Oxide 400-600mg  daily  If You Get a Bad Headache/Migraine:  Benadryl 25mg    Magnesium Oxide  1 large Gatorade  2 extra strength Tylenol (1,000mg  total)  1 cup coffee or Coke  If this doesn't help please call us @ [phone]  Levonorgestrel intrauterine device (IUD) What is this medicine? LEVONORGESTREL IUD (LEE voe nor jes trel) is a contraceptive (birth control) device. The device is placed inside the uterus by a healthcare professional. It is used to prevent pregnancy and can also be used to treat heavy bleeding that occurs during your period. Depending on the device, it can be used for 3 to 5 years. This medicine may be used for other purposes; ask your health care provider or pharmacist if you have questions. What should I tell my health care provider before I take this medicine? They need to know if you have any of these conditions: -abnormal Pap smear -cancer of the breast, uterus, or cervix -diabetes -endometritis -genital or pelvic infection now or in the past -have more than one sexual partner or your partner has more than one partner -heart disease -history of an ectopic or  tubal pregnancy -immune system problems -IUD in place -liver disease or tumor -problems with blood clots or take blood-thinners -use intravenous drugs -uterus of unusual shape -vaginal bleeding that has not been explained -an unusual or allergic reaction to levonorgestrel, other hormones, silicone, or polyethylene, medicines, foods, dyes, or preservatives -pregnant or trying to get pregnant -breast-feeding How should I use this medicine? This device is placed inside the uterus by a health care professional. Talk to your pediatrician regarding the use of this medicine in children. Special care may be needed. Overdosage: If you think you have taken too much of this medicine contact a poison control center or emergency room at once. NOTE: This medicine is only for you. Do not share this medicine with others. What if I miss a dose? This does not apply. What may interact with this medicine? Do not take this medicine with any of the following medications: -amprenavir -bosentan -fosamprenavir This medicine may also interact with the following medications: -aprepitant -barbiturate medicines for inducing sleep or treating seizures -bexarotene -griseofulvin -medicines to treat seizures like carbamazepine, ethotoin, felbamate, oxcarbazepine, phenytoin, topiramate -modafinil -pioglitazone -rifabutin -rifampin -rifapentine -some medicines to treat HIV infection like atazanavir, indinavir, lopinavir, nelfinavir, tipranavir, ritonavir -St. John's wort -warfarin This list may not describe all possible interactions. Give your health care provider a list of all the medicines, herbs, non-prescription drugs, or dietary supplements you use. Also tell them if you smoke, drink alcohol, or use illegal drugs. Some items may interact with your medicine.  What should I watch for while using this medicine? Visit your doctor or health care professional for regular check ups. See your doctor if you or your  partner has sexual contact with others, becomes HIV positive, or gets a sexual transmitted disease. This product does not protect you against HIV infection (AIDS) or other sexually transmitted diseases. You can check the placement of the IUD yourself by reaching up to the top of your vagina with clean fingers to feel the threads. Do not pull on the threads. It is a good habit to check placement after each menstrual period. Call your doctor right away if you feel more of the IUD than just the threads or if you cannot feel the threads at all. The IUD may come out by itself. You may become pregnant if the device comes out. If you notice that the IUD has come out use a backup birth control method like condoms and call your health care provider. Using tampons will not change the position of the IUD and are okay to use during your period. What side effects may I notice from receiving this medicine? Side effects that you should report to your doctor or health care professional as soon as possible: -allergic reactions like skin rash, itching or hives, swelling of the face, lips, or tongue -fever, flu-like symptoms -genital sores -high blood pressure -no menstrual period for 6 weeks during use -pain, swelling, warmth in the leg -pelvic pain or tenderness -severe or sudden headache -signs of pregnancy -stomach cramping -sudden shortness of breath -trouble with balance, talking, or walking -unusual vaginal bleeding, discharge -yellowing of the eyes or skin Side effects that usually do not require medical attention (report to your doctor or health care professional if they continue or are bothersome): -acne -breast pain -change in sex drive or performance -changes in weight -cramping, dizziness, or faintness while the device is being inserted -headache -irregular menstrual bleeding within first 3 to 6 months of use -nausea This list may not describe all possible side effects. Call your doctor for  medical advice about side effects. You may report side effects to FDA at 1-800-FDA-1088. Where should I keep my medicine? This does not apply. NOTE: This sheet is a summary. It may not cover all possible information. If you have questions about this medicine, talk to your doctor, pharmacist, or health care provider.    2016, Elsevier/Gold Standard. (2011-03-28 13:54:04)

## 2015-09-07 LAB — CYTOLOGY - PAP

## 2015-09-11 ENCOUNTER — Telehealth: Payer: Self-pay | Admitting: Women's Health

## 2015-09-11 DIAGNOSIS — R87618 Other abnormal cytological findings on specimens from cervix uteri: Secondary | ICD-10-CM

## 2015-09-11 NOTE — Telephone Encounter (Signed)
LM for pt to return call. Need to notify of abnormal pap, need for colpo.  Cheral MarkerKimberly R. Imoni Kohen, CNM, [facility]NP-BC 09/11/2015 1:34 PM

## 2015-09-23 ENCOUNTER — Encounter: Payer: Self-pay | Admitting: Women's Health

## 2015-09-24 ENCOUNTER — Encounter: Payer: Self-pay | Admitting: Women's Health

## 2015-09-25 ENCOUNTER — Encounter: Payer: Self-pay | Admitting: Women's Health

## 2015-09-26 ENCOUNTER — Encounter: Payer: Self-pay | Admitting: Women's Health

## 2015-09-26 ENCOUNTER — Ambulatory Visit (INDEPENDENT_AMBULATORY_CARE_PROVIDER_SITE_OTHER): Payer: BLUE CROSS/BLUE SHIELD | Admitting: Women's Health

## 2015-09-26 VITALS — BP 138/72 | HR 92 | Ht 67.0 in | Wt 257.0 lb

## 2015-09-26 DIAGNOSIS — Z3490 Encounter for supervision of normal pregnancy, unspecified, unspecified trimester: Secondary | ICD-10-CM

## 2015-09-26 DIAGNOSIS — N926 Irregular menstruation, unspecified: Secondary | ICD-10-CM | POA: Diagnosis not present

## 2015-09-26 DIAGNOSIS — Z3009 Encounter for other general counseling and advice on contraception: Secondary | ICD-10-CM | POA: Diagnosis not present

## 2015-09-26 DIAGNOSIS — Z3201 Encounter for pregnancy test, result positive: Secondary | ICD-10-CM | POA: Diagnosis not present

## 2015-09-26 DIAGNOSIS — O3680X0 Pregnancy with inconclusive fetal viability, not applicable or unspecified: Secondary | ICD-10-CM

## 2015-09-26 LAB — POCT URINE PREGNANCY: Preg Test, Ur: POSITIVE — AB

## 2015-09-26 NOTE — Progress Notes (Signed)
Patient ID: Kristina [name] Hoopingarner, female   DOB: [birth year], 25 y.o.   MRN: [id number]   [facility]  Patient name: Kristina [name] Rake MRN [id number]  Date of birth: [birth year]  CC & HPI:  Kristina [name] Dezarn is a 25 y.o. 23P2012 Caucasian female presenting today for +HPT. Was planning on getting Mirena, however she has been having unprotected sex w/ her husband. They are both excited about the pregnancy, she states she plans on getting her tubes tied after this one. Denies n/v. Taking valtrex for HSV2 suppression.  3767w2d by LMP of 6/18 w/ EDC of 06/02/16 Patient's last menstrual period was 08/27/2015.  Pertinent History Reviewed:  Medical & Surgical Hx:   Past medical, surgical, family, and social history reviewed in electronic medical record Medications: Reviewed & Updated - see associated section Allergies: Reviewed in electronic medical record  Objective Findings:  Vitals: BP 138/72 mmHg  Pulse 92  Ht 5\' 7"  (1.702 m)  Wt 257 lb (116.574 kg)  BMI 40.24 kg/m2  LMP 08/27/2015 Body mass index is 40.24 kg/(m^2).  Physical Examination: General appearance - alert, well appearing, and in no distress  Results for orders placed or performed in visit on 09/26/15 (from the past 24 hour(s))  POCT urine pregnancy   Collection Time: 09/26/15  4:07 PM  Result Value Ref Range   Preg Test, Ur Positive (A) Negative     Assessment & Plan:  A:   5567w2d pregnant by LMP  P:  Begin pnv today  Return in about 2 weeks (around 10/10/2015) for dating u/s, then 4wks for new ob visit.  Marge DuncansBooker, Kambryn Dapolito Randall CNM, [facility]NP-BC 09/26/2015 4:52 PM

## 2015-09-26 NOTE — Patient Instructions (Signed)

## 2015-10-05 ENCOUNTER — Telehealth: Payer: Self-pay | Admitting: *Deleted

## 2015-10-05 NOTE — Telephone Encounter (Signed)
Pt states 6 weeks had some spotting when she wiped after urinating last night and noticed small amount of blood in undergarment this am, light cramping.   Pt informed to continue to monitor, if spotting or cramping increases call our office back, keep her ultrasound appt for Monday. Pt verbalized understanding.

## 2015-10-09 ENCOUNTER — Encounter: Payer: Self-pay | Admitting: Women's Health

## 2015-10-09 MED ORDER — DOXYLAMINE-PYRIDOXINE 10-10 MG PO TBEC: DELAYED_RELEASE_TABLET | ORAL | 6 refills | Status: DC

## 2015-10-10 ENCOUNTER — Ambulatory Visit (INDEPENDENT_AMBULATORY_CARE_PROVIDER_SITE_OTHER): Payer: BLUE CROSS/BLUE SHIELD

## 2015-10-10 DIAGNOSIS — Z3A01 Less than 8 weeks gestation of pregnancy: Secondary | ICD-10-CM

## 2015-10-10 DIAGNOSIS — O3680X0 Pregnancy with inconclusive fetal viability, not applicable or unspecified: Secondary | ICD-10-CM

## 2015-10-10 NOTE — Progress Notes (Signed)
[place] 6+2 wks,single IUP w/ys,pos fht 107 bpm,normal rt ov,simple lt ov cyst 6.1 x 5 x 5.3 cm,crl 4.7 mm

## 2015-10-24 ENCOUNTER — Encounter: Payer: Self-pay | Admitting: Women's Health

## 2015-10-24 ENCOUNTER — Ambulatory Visit (INDEPENDENT_AMBULATORY_CARE_PROVIDER_SITE_OTHER): Payer: BLUE CROSS/BLUE SHIELD | Admitting: Women's Health

## 2015-10-24 VITALS — BP 124/66 | HR 80 | Wt 253.0 lb

## 2015-10-24 DIAGNOSIS — Z331 Pregnant state, incidental: Secondary | ICD-10-CM

## 2015-10-24 DIAGNOSIS — Z369 Encounter for antenatal screening, unspecified: Secondary | ICD-10-CM

## 2015-10-24 DIAGNOSIS — N83202 Unspecified ovarian cyst, left side: Secondary | ICD-10-CM

## 2015-10-24 DIAGNOSIS — Z3682 Encounter for antenatal screening for nuchal translucency: Secondary | ICD-10-CM

## 2015-10-24 DIAGNOSIS — Z3491 Encounter for supervision of normal pregnancy, unspecified, first trimester: Secondary | ICD-10-CM

## 2015-10-24 DIAGNOSIS — R87618 Other abnormal cytological findings on specimens from cervix uteri: Secondary | ICD-10-CM

## 2015-10-24 DIAGNOSIS — A6000 Herpesviral infection of urogenital system, unspecified: Secondary | ICD-10-CM

## 2015-10-24 DIAGNOSIS — Z3490 Encounter for supervision of normal pregnancy, unspecified, unspecified trimester: Secondary | ICD-10-CM | POA: Insufficient documentation

## 2015-10-24 DIAGNOSIS — Z1389 Encounter for screening for other disorder: Secondary | ICD-10-CM

## 2015-10-24 DIAGNOSIS — Z0283 Encounter for blood-alcohol and blood-drug test: Secondary | ICD-10-CM

## 2015-10-24 LAB — POCT URINALYSIS DIPSTICK
GLUCOSE UA: NEGATIVE
KETONES UA: NEGATIVE
Leukocytes, UA: NEGATIVE
Nitrite, UA: NEGATIVE
Protein, UA: NEGATIVE

## 2015-10-24 NOTE — Patient Instructions (Signed)

## 2015-10-24 NOTE — Progress Notes (Signed)
Subjective:  Kristina [name] is a 25 y.o. [phone] Caucasian female at 4721w2d by LMP c/w 6wk u/s, being seen today for her first obstetrical visit.  Her obstetrical history is significant for h/o 17wk SAB (delivered at home @ 21wks, 17wk fetal size), obesity, recent abnormal pap-became pregnant before we could schedule colpo, known HSV2+, Lt ovarian cyst noted @ dating u/s 6.1x5x5.3cm.  Pregnancy history fully reviewed.  Patient reports nausea- taking 2 diclegis at night, some occ LLQ pain- nothing severe. Denies vb, cramping, uti s/s, abnormal/malodorous vag d/c, or vulvovaginal itching/irritation.  BP 124/66   Pulse 80   Wt 253 lb (114.8 kg)   LMP 08/27/2015 (Approximate)   BMI 39.63 kg/m   HISTORY: OB History  Gravida Para Term Preterm AB Living  4 2 2   1 2   SAB TAB Ectopic Multiple Live Births  1     0 2    # Outcome Date GA Lbr Len/2nd Weight Sex Delivery Anes PTL Lv  4 Current           3 Term 06/10/14 4476w1d 06:40 / 00:19 8 lb 1.5 oz (3.67 kg) M Vag-Spont EPI N LIV  2 SAB 05/08/13 2155w0d  3 oz (0.085 kg)   None       Birth Comments: Delivered at home at 4152w0d, but fetus had been deceased for a few weeks/appeared to be 16-17wk size  1 Term 12/09/07 5847w6d  7 lb 10 oz (3.459 kg) F Vag-Spont EPI N LIV     Past Medical History:  Diagnosis Date  . Anemia   . Herpes   . Medical history non-contributory   . Miscarriage   . Pregnant   . Rh negative state in antepartum period   . Vaginal Pap smear, abnormal    Past Surgical History:  Procedure Laterality Date  . CHOLECYSTECTOMY N/A 01/04/2015   Procedure: LAPAROSCOPIC CHOLECYSTECTOMY;  Surgeon: Franky MachoMark Jenkins, MD;  Location: [facility];  Service: General;  Laterality: N/A;  . TONSILLECTOMY     Family History  Problem Relation Age of Onset  . Cancer Maternal Grandmother     breast  . Diabetes Paternal Grandfather   . Jaundice Son     Exam   System:     General: Well developed & nourished, no acute distress   Skin: Warm &  dry, normal coloration and turgor, no rashes   Neurologic: Alert & oriented, normal mood   Cardiovascular: Regular rate & rhythm   Respiratory: Effort & rate normal, LCTAB, acyanotic   Abdomen: Soft, non tender   Extremities: normal strength, tone  Thin prep pap smear atypical endocervical glandular cells June 2017, needs colpo >14wks  FHR: 171 via informal transabdominal u/s   Assessment:   Pregnancy: [id number] Patient Active Problem List   Diagnosis Date Noted  . Supervision of normal pregnancy 10/24/2015    Priority: High  . Abnormal Pap smear of cervix 04/06/2013    Priority: High  . Left ovarian cyst 10/24/2015  . Frequent headaches 09/05/2015  . Obese 09/05/2015  . Genital herpes 08/09/2015    121w2d [id number] New OB visit Lt ovarian simple cyst Nausea of pregnancy Obesity  Plan:  Initial labs drawn Continue prenatal vitamins Problem list reviewed and updated Reviewed n/v relief measures and warning s/s to report Reviewed recommended weight gain based on pre-gravid BMI Encouraged well-balanced diet Genetic Screening discussed Integrated Screen: requested Cystic fibrosis screening discussed declined Ultrasound discussed; fetal survey: requested Follow up in 4 weeks for 1st it/nt and  visit [facility] completed Discussed s/s ovarian torsion  Can add 1 diclegis in am, 1 in afternoon if needed  Marge DuncansBooker, Tami Blass Randall CNM, [facility]NP-BC 10/24/2015 10:47 AM

## 2015-10-25 ENCOUNTER — Encounter: Payer: Self-pay | Admitting: Women's Health

## 2015-10-26 LAB — PMP SCREEN PROFILE (10S), URINE
Amphetamine Screen, Ur: NEGATIVE ng/mL
BENZODIAZEPINE SCREEN, URINE: NEGATIVE ng/mL
Barbiturate Screen, Ur: NEGATIVE ng/mL
CANNABINOIDS UR QL SCN: NEGATIVE ng/mL
Cocaine(Metab.)Screen, Urine: NEGATIVE ng/mL
Creatinine(Crt), U: 290.3 mg/dL (ref 20.0–300.0)
Methadone Scn, Ur: NEGATIVE ng/mL
OXYCODONE+OXYMORPHONE UR QL SCN: NEGATIVE ng/mL
Opiate Scrn, Ur: NEGATIVE ng/mL
PCP Scrn, Ur: NEGATIVE ng/mL
PH UR, DRUG SCRN: 6 (ref 4.5–8.9)
Propoxyphene, Screen: NEGATIVE ng/mL

## 2015-10-26 LAB — URINALYSIS, ROUTINE W REFLEX MICROSCOPIC
BILIRUBIN UA: NEGATIVE
Glucose, UA: NEGATIVE
Ketones, UA: NEGATIVE
NITRITE UA: NEGATIVE
PH UA: 6 (ref 5.0–7.5)
Specific Gravity, UA: 1.027 (ref 1.005–1.030)
UUROB: 1 mg/dL (ref 0.2–1.0)

## 2015-10-26 LAB — GC/CHLAMYDIA PROBE AMP
CHLAMYDIA, DNA PROBE: NEGATIVE
NEISSERIA GONORRHOEAE BY PCR: NEGATIVE

## 2015-10-26 LAB — HEPATITIS B SURFACE ANTIGEN: HEP B S AG: NEGATIVE

## 2015-10-26 LAB — RPR: RPR: NONREACTIVE

## 2015-10-26 LAB — URINE CULTURE: ORGANISM ID, BACTERIA: NO GROWTH

## 2015-10-26 LAB — HIV ANTIBODY (ROUTINE TESTING W REFLEX): HIV SCREEN 4TH GENERATION: NONREACTIVE

## 2015-10-26 LAB — CBC
HEMATOCRIT: 37.5 % (ref 34.0–46.6)
HEMOGLOBIN: 12.2 g/dL (ref 11.1–15.9)
MCH: 26.2 pg — ABNORMAL LOW (ref 26.6–33.0)
MCHC: 32.5 g/dL (ref 31.5–35.7)
MCV: 81 fL (ref 79–97)
Platelets: 250 10*3/uL (ref 150–379)
RBC: 4.65 x10E6/uL (ref 3.77–5.28)
RDW: 15.8 % — AB (ref 12.3–15.4)
WBC: 6.7 10*3/uL (ref 3.4–10.8)

## 2015-10-26 LAB — MICROSCOPIC EXAMINATION: Casts: NONE SEEN /lpf

## 2015-10-26 LAB — VARICELLA ZOSTER ANTIBODY, IGG: Varicella zoster IgG: 1068 index (ref >165)

## 2015-10-26 LAB — RUBELLA SCREEN: Rubella Antibodies, IGG: 5.12 index (ref >0.99)

## 2015-10-26 LAB — ABO/RH: Rh Factor: NEGATIVE

## 2015-10-26 LAB — ANTIBODY SCREEN: ANTIBODY SCREEN: NEGATIVE

## 2015-11-09 ENCOUNTER — Encounter: Payer: Self-pay | Admitting: Women's Health

## 2015-11-21 ENCOUNTER — Encounter: Payer: Self-pay | Admitting: Women's Health

## 2015-11-21 ENCOUNTER — Ambulatory Visit (INDEPENDENT_AMBULATORY_CARE_PROVIDER_SITE_OTHER): Payer: BLUE CROSS/BLUE SHIELD | Admitting: Women's Health

## 2015-11-21 ENCOUNTER — Ambulatory Visit (INDEPENDENT_AMBULATORY_CARE_PROVIDER_SITE_OTHER): Payer: BLUE CROSS/BLUE SHIELD

## 2015-11-21 VITALS — BP 110/62 | HR 84 | Wt 244.0 lb

## 2015-11-21 DIAGNOSIS — F329 Major depressive disorder, single episode, unspecified: Secondary | ICD-10-CM | POA: Insufficient documentation

## 2015-11-21 DIAGNOSIS — Z3A13 13 weeks gestation of pregnancy: Secondary | ICD-10-CM | POA: Diagnosis not present

## 2015-11-21 DIAGNOSIS — Z36 Encounter for antenatal screening of mother: Secondary | ICD-10-CM | POA: Diagnosis not present

## 2015-11-21 DIAGNOSIS — F32A Depression, unspecified: Secondary | ICD-10-CM

## 2015-11-21 DIAGNOSIS — O209 Hemorrhage in early pregnancy, unspecified: Secondary | ICD-10-CM | POA: Insufficient documentation

## 2015-11-21 DIAGNOSIS — Z3481 Encounter for supervision of other normal pregnancy, first trimester: Secondary | ICD-10-CM

## 2015-11-21 DIAGNOSIS — Z3682 Encounter for antenatal screening for nuchal translucency: Secondary | ICD-10-CM

## 2015-11-21 DIAGNOSIS — O99342 Other mental disorders complicating pregnancy, second trimester: Secondary | ICD-10-CM

## 2015-11-21 DIAGNOSIS — O3491 Maternal care for abnormality of pelvic organ, unspecified, first trimester: Secondary | ICD-10-CM | POA: Diagnosis not present

## 2015-11-21 DIAGNOSIS — Z3491 Encounter for supervision of normal pregnancy, unspecified, first trimester: Secondary | ICD-10-CM

## 2015-11-21 DIAGNOSIS — Z1389 Encounter for screening for other disorder: Secondary | ICD-10-CM

## 2015-11-21 DIAGNOSIS — O360110 Maternal care for anti-D [Rh] antibodies, first trimester, not applicable or unspecified: Secondary | ICD-10-CM

## 2015-11-21 DIAGNOSIS — Z331 Pregnant state, incidental: Secondary | ICD-10-CM

## 2015-11-21 LAB — POCT URINALYSIS DIPSTICK
GLUCOSE UA: NEGATIVE
Ketones, UA: NEGATIVE
LEUKOCYTES UA: NEGATIVE
NITRITE UA: NEGATIVE
Protein, UA: NEGATIVE

## 2015-11-21 MED ORDER — RHO D IMMUNE GLOBULIN 1500 UNIT/2ML IJ SOSY
300.0000 ug | PREFILLED_SYRINGE | Freq: Once | INTRAMUSCULAR | Status: AC
  Administered 2015-11-21: 300 ug via INTRAMUSCULAR

## 2015-11-21 MED ORDER — ESCITALOPRAM OXALATE 10 MG PO TABS: 10.0000 mg | ORAL_TABLET | Freq: Every day | ORAL | 6 refills | Status: DC

## 2015-11-21 NOTE — Addendum Note (Signed)
Addended by: Gaylyn RongEVANS, Lisandra Mathisen A on: 11/21/2015 12:35 PM   Modules accepted: Orders

## 2015-11-21 NOTE — Patient Instructions (Signed)

## 2015-11-21 NOTE — Progress Notes (Signed)
US 12+2 wks,measurements c/w dates,CRL 54.7 mm,NB present,NT 1.2 mm,normal rt ov,simple lt ov cyst 5.7 4.8 x 3.8 cm

## 2015-11-21 NOTE — Progress Notes (Signed)
Low-risk OB appointment [id number] 4618w2d Estimated Date of Delivery: 06/02/16 BP 110/62   Pulse 84   Wt 244 lb (110.7 kg)   LMP 08/27/2015 (Approximate)   BMI 38.22 kg/m   BP, weight, and urine reviewed.  Refer to obstetrical flow sheet for FH & FHR.  No fm yet. Denies cramping, lof, or uti s/s. Bright red blood after sex 3 days ago, slightly more than spotting, then brown. Denies abnormal d/c, itching/odor/irritation. Declines spec exam today d/t not showering in past 4d, would rather wait until next visit when has to do colpo. Will go ahead and give Rhogam today as she is Rh neg.  Reports feeling depressed lately, does have h/o depression, was hospitalized about 439yrs ago- was on lexapro at that time and counseling- but didn't feel like counseling was beneficial. Does report fleeting thoughts of harming self, but quickly shakes them off. Denis plan. Sleeps all the time, doesn't feel like doing anything, including showering. Not eating well, but feels like that is mainly pregnancy related. Doesn't find joy in things she used to. Declines counseling, does feel like she wants to get back on Lexapro. Rx Lexapro 10mg  daily, understands can take few weeks to notice improvement, if worsens or suicidal thoughts become more frequent/develops plan to let us know or go straight to ED.  Reviewed today's normal nt u/s, warning s/s to report. Plan:  Continue routine obstetrical care  F/U in 4wks for OB appointment and colpo w/ JVF, 2nd IT 1st IT/NT today, Rhogam today

## 2015-11-23 LAB — MATERNAL SCREEN, INTEGRATED #1
Crown Rump Length: 54.7 mm
GEST. AGE ON COLLECTION DATE: 12.1 wk
Maternal Age at EDD: 28 years
Nuchal Translucency (NT): 1.2 mm
Number of Fetuses: 1
PAPP-A VALUE: 296.9 ng/mL
WEIGHT: 244 [lb_av]

## 2015-11-28 ENCOUNTER — Other Ambulatory Visit: Payer: Self-pay | Admitting: Women's Health

## 2015-11-28 ENCOUNTER — Encounter (HOSPITAL_COMMUNITY): Payer: Self-pay | Admitting: Emergency Medicine

## 2015-11-28 ENCOUNTER — Observation Stay (HOSPITAL_COMMUNITY)
Admission: EM | Admit: 2015-11-28 | Discharge: 2015-11-30 | Disposition: A | Discharge status: Home / Self Care | Payer: BLUE CROSS/BLUE SHIELD | Attending: Internal Medicine | Admitting: Internal Medicine

## 2015-11-28 ENCOUNTER — Encounter: Payer: Self-pay | Admitting: Women's Health

## 2015-11-28 ENCOUNTER — Telehealth: Payer: Self-pay | Admitting: Advanced Practice Midwife

## 2015-11-28 DIAGNOSIS — E876 Hypokalemia: Secondary | ICD-10-CM | POA: Diagnosis present

## 2015-11-28 DIAGNOSIS — Z87891 Personal history of nicotine dependence: Secondary | ICD-10-CM | POA: Insufficient documentation

## 2015-11-28 DIAGNOSIS — K529 Noninfective gastroenteritis and colitis, unspecified: Secondary | ICD-10-CM | POA: Diagnosis present

## 2015-11-28 DIAGNOSIS — Z3A13 13 weeks gestation of pregnancy: Secondary | ICD-10-CM | POA: Diagnosis not present

## 2015-11-28 DIAGNOSIS — Z79899 Other long term (current) drug therapy: Secondary | ICD-10-CM | POA: Insufficient documentation

## 2015-11-28 DIAGNOSIS — R197 Diarrhea, unspecified: Secondary | ICD-10-CM | POA: Insufficient documentation

## 2015-11-28 DIAGNOSIS — E860 Dehydration: Secondary | ICD-10-CM | POA: Diagnosis present

## 2015-11-28 DIAGNOSIS — O219 Vomiting of pregnancy, unspecified: Principal | ICD-10-CM | POA: Insufficient documentation

## 2015-11-28 DIAGNOSIS — A084 Viral intestinal infection, unspecified: Secondary | ICD-10-CM | POA: Diagnosis present

## 2015-11-28 DIAGNOSIS — R112 Nausea with vomiting, unspecified: Secondary | ICD-10-CM

## 2015-11-28 DIAGNOSIS — Z3490 Encounter for supervision of normal pregnancy, unspecified, unspecified trimester: Secondary | ICD-10-CM

## 2015-11-28 LAB — COMPREHENSIVE METABOLIC PANEL
ALK PHOS: 76 U/L (ref 38–126)
ALT: 77 U/L — AB (ref 14–54)
ANION GAP: 11 (ref 5–15)
AST: 68 U/L — ABNORMAL HIGH (ref 15–41)
Albumin: 3.9 g/dL (ref 3.5–5.0)
BUN: 9 mg/dL (ref 6–20)
CALCIUM: 8.9 mg/dL (ref 8.9–10.3)
CO2: 24 mmol/L (ref 22–32)
CREATININE: 0.72 mg/dL (ref 0.44–1.00)
Chloride: 101 mmol/L (ref 101–111)
GFR calc Af Amer: >60 mL/min (ref >60)
GFR calc non Af Amer: >60 mL/min (ref >60)
GLUCOSE: 94 mg/dL (ref 65–99)
Potassium: 3.2 mmol/L — ABNORMAL LOW (ref 3.5–5.1)
Sodium: 136 mmol/L (ref 135–145)
Total Bilirubin: 0.5 mg/dL (ref 0.3–1.2)
Total Protein: 7.5 g/dL (ref 6.5–8.1)

## 2015-11-28 LAB — CBC
HCT: 41.2 % (ref 36.0–46.0)
HEMOGLOBIN: 13.8 g/dL (ref 12.0–15.0)
MCH: 27.2 pg (ref 26.0–34.0)
MCHC: 33.5 g/dL (ref 30.0–36.0)
MCV: 81.3 fL (ref 78.0–100.0)
Platelets: 191 10*3/uL (ref 150–400)
RBC: 5.07 MIL/uL (ref 3.87–5.11)
RDW: 14.5 % (ref 11.5–15.5)
WBC: 4.8 10*3/uL (ref 4.0–10.5)

## 2015-11-28 LAB — CBG MONITORING, ED: GLUCOSE-CAPILLARY: 83 mg/dL (ref 65–99)

## 2015-11-28 LAB — URINE MICROSCOPIC-ADD ON

## 2015-11-28 LAB — URINALYSIS, ROUTINE W REFLEX MICROSCOPIC
GLUCOSE, UA: NEGATIVE mg/dL
Ketones, ur: >80 mg/dL — AB
Leukocytes, UA: NEGATIVE
Nitrite: NEGATIVE
Protein, ur: 30 mg/dL — AB
SPECIFIC GRAVITY, URINE: 1.025 (ref 1.005–1.030)
pH: 6 (ref 5.0–8.0)

## 2015-11-28 MED ORDER — POTASSIUM CHLORIDE CRYS ER 20 MEQ PO TBCR
40.0000 meq | EXTENDED_RELEASE_TABLET | Freq: Once | ORAL | Status: AC
  Administered 2015-11-28: 40 meq via ORAL
  Filled 2015-11-28: qty 2

## 2015-11-28 MED ORDER — PROMETHAZINE HCL 25 MG/ML IJ SOLN
25.0000 mg | Freq: Once | INTRAMUSCULAR | Status: AC
  Administered 2015-11-28: 25 mg via INTRAVENOUS
  Filled 2015-11-28: qty 1

## 2015-11-28 MED ORDER — PROMETHAZINE HCL 25 MG PO TABS: 12.5000 mg | ORAL_TABLET | Freq: Four times a day (QID) | ORAL | 0 refills | Status: DC | PRN

## 2015-11-28 MED ORDER — SODIUM CHLORIDE 0.9 % IV BOLUS (SEPSIS)
1000.0000 mL | Freq: Once | INTRAVENOUS | Status: AC
  Administered 2015-11-28: 1000 mL via INTRAVENOUS

## 2015-11-28 NOTE — ED Notes (Signed)
Report given to Misty StanleyLisa, Charity fundraiserN. Pt ready for transport.

## 2015-11-28 NOTE — H&P (Signed)
History and Physical    [name]:[id number] DOB: September 29, 1990 DOA: 11/28/2015  Referring MD/NP/PA: Arthor Captain, PA-C PCP: [facility] ( Outpatient Specialists: None Patient coming from: Home  Chief Complaint: N/V/D  HPI: Kristina [name] is a 25 y.o. female with 13 week pregnancy, G4P4, with  no significant medical history presents to the ED with complaints of N/V/D that onset Sunday. She admits that both her daughter and husband are having similar GI symptoms, lasting 2-3 days and resolved. She denies any recent travel history. Denies bleeding and abd pain. She is currently on her forth pregnancy and is [redacted] weeks pregnant. Her OB-GYN is Dr Pierce Crane, and she has had no complication.  Hospitalist was asked to admit her for IVF and K replacement, along with antiemetics.    ED Course: While in the ED, Pt is afebrile, CBC unremarkable, potassium 3.2, AST 68, ALT 77. UA shows many bacteria, but no WBC, nitrite or leukocytes.     Review of Systems: As per HPI otherwise 10 point review of systems negative.   Past Medical History:  Diagnosis Date  . Anemia   . Herpes   . Medical history non-contributory   . Miscarriage   . Pregnant   . Rh negative state in antepartum period   . Vaginal Pap smear, abnormal     Past Surgical History:  Procedure Laterality Date  . CHOLECYSTECTOMY N/A 01/04/2015   Procedure: LAPAROSCOPIC CHOLECYSTECTOMY;  Surgeon: Franky Macho, MD;  Location: [facility];  Service: General;  Laterality: N/A;  . TONSILLECTOMY       reports that she quit smoking about 2 years ago. Her smoking use included Cigarettes. She has a 4.50 pack-year smoking history. She has never used smokeless tobacco. She reports that she does not drink alcohol or use drugs.  No Known Allergies  Family History  Problem Relation Age of Onset  . Cancer Maternal Grandmother     breast  . Diabetes Paternal Grandfather   . Jaundice Son    Unacceptable:  Noncontributory, unremarkable, or negative. Acceptable: Family history reviewed and not pertinent (If you reviewed it)  Prior to Admission medications   Medication Sig Start Date End Date Taking? Authorizing Provider  Doxylamine-Pyridoxine (DICLEGIS) 10-10 MG TBEC 2 tabs q hs, if sx persist add 1 tab q am on day 3, if sx persist add 1 tab q afternoon on day 4 10/09/15  Yes Cheral Marker, CNM  Prenatal Vit-Fe Fumarate-FA (PRENATAL VITAMIN PO) Take 1 tablet by mouth daily.    Yes Historical Provider, MD  acetaminophen (TYLENOL) 325 MG tablet Take 650 mg by mouth every 6 (six) hours as needed.    Historical Provider, MD  escitalopram (LEXAPRO) 10 MG tablet Take 1 tablet (10 mg total) by mouth daily. Patient not taking: Reported on 11/28/2015 11/21/15   Cheral Marker, CNM  promethazine (PHENERGAN) 25 MG tablet Take 0.5-1 tablets (12.5-25 mg total) by mouth every 6 (six) hours as needed for nausea or vomiting. 11/28/15   Arthor Captain, PA-C  valACYclovir (VALTREX) 1000 MG tablet Take 1 tablet (1,000 mg total) by mouth 2 (two) times daily. X 10 days Patient not taking: Reported on 10/24/2015 08/08/15   Cheral Marker, CNM    Physical Exam: Vitals:   11/28/15 1502 11/28/15 1504 11/28/15 1625 11/28/15 1954  BP:  124/76 105/70 120/61  Pulse:  108 91 87  Resp:  16 18 18   Temp:  97.8 F (36.6 C) 97.7 F (36.5  C) 97.7 F (36.5 C)  TempSrc:  Oral Oral Oral  SpO2:  100% 100% 100%  Weight: 110.7 kg (244 lb)     Height: 5\' 7"  (1.702 m)         Constitutional: NAD, calm, comfortable Vitals:   11/28/15 1502 11/28/15 1504 11/28/15 1625 11/28/15 1954  BP:  124/76 105/70 120/61  Pulse:  108 91 87  Resp:  16 18 18   Temp:  97.8 F (36.6 C) 97.7 F (36.5 C) 97.7 F (36.5 C)  TempSrc:  Oral Oral Oral  SpO2:  100% 100% 100%  Weight: 110.7 kg (244 lb)     Height: 5\' 7"  (1.702 m)      Eyes: PERRL, lids and conjunctivae normal ENMT: Mucous membranes are moist. Posterior pharynx clear of any  exudate or lesions.Normal dentition.  Neck: normal, supple, no masses, no thyromegaly Respiratory: clear to auscultation bilaterally, no wheezing, no crackles. Normal respiratory effort. No accessory muscle use.  Cardiovascular: Regular rate and rhythm, no murmurs / rubs / gallops. No extremity edema. 2+ pedal pulses. No carotid bruits.  Abdomen: no tenderness, no masses palpated. No hepatosplenomegaly. Bowel sounds positive.  Musculoskeletal: no clubbing / cyanosis. No joint deformity upper and lower extremities. Good ROM, no contractures. Normal muscle tone.  Skin: no rashes, lesions, ulcers. No induration Neurologic: CN 2-12 grossly intact. Sensation intact, DTR normal. Strength 5/5 in all 4.  Psychiatric: Normal judgment and insight. Alert and oriented x 3. Normal mood.   Labs on Admission: I have personally reviewed following labs and imaging studies  CBC:  Recent Labs Lab 11/28/15 1505  WBC 4.8  HGB 13.8  HCT 41.2  MCV 81.3  PLT 191   Basic Metabolic Panel:  Recent Labs Lab 11/28/15 1505  NA 136  K 3.2*  CL 101  CO2 24  GLUCOSE 94  BUN 9  CREATININE 0.72  CALCIUM 8.9   GFR: Estimated Creatinine Clearance: 135.4 mL/min (by C-G formula based on SCr of 0.72 mg/dL). Liver Function Tests:  Recent Labs Lab 11/28/15 1505  AST 68*  ALT 77*  ALKPHOS 76  BILITOT 0.5  PROT 7.5  ALBUMIN 3.9   CBG:  Recent Labs Lab 11/28/15 1639  GLUCAP 83   Urine analysis:    Component Value Date/Time   COLORURINE YELLOW 11/28/2015 1505   APPEARANCEUR CLEAR 11/28/2015 1505   APPEARANCEUR Cloudy (A) 10/24/2015 1121   LABSPEC 1.025 11/28/2015 1505   PHURINE 6.0 11/28/2015 1505   GLUCOSEU NEGATIVE 11/28/2015 1505   HGBUR TRACE (A) 11/28/2015 1505   BILIRUBINUR SMALL (A) 11/28/2015 1505   BILIRUBINUR Negative 10/24/2015 1121   KETONESUR >80 (A) 11/28/2015 1505   PROTEINUR 30 (A) 11/28/2015 1505   UROBILINOGEN 1.0 06/21/2014 2002   NITRITE NEGATIVE 11/28/2015 1505    LEUKOCYTESUR NEGATIVE 11/28/2015 1505   LEUKOCYTESUR 1+ (A) 10/24/2015 1121     Radiological Exams on Admission: No results found.  Assessment/Plan    Dehydration, moderate   Gastroenteritis   Hypokalemia   Pregnancy    1. Gastroenteritis. WBC wnl at 4.8. Start on clear liquids. She is [redacted] weeks pregnant. Will consult Dr. Emelda Fear in the morning. Continue to monitor. 2. Dehydration, moderate. She has been vomiting since Sunday. Will start on IV fluids and antiemetics. Monitor closely. 3. Hypokalemia. Low at 3.2. Start on potassium supplementation. Recheck BMP in the am.    DVT prophylaxis: SCD Code Status: Full Family Communication: Mother-in-law at bedside  Disposition Plan: Discharge once improved Consults called: None Admission status:  Admit to observation   Houston SirenPeter Kaleigh Spiegelman, MD FACP [facility] If 7PM-7AM, please contact night-coverage [email or web address] Password [email or web address] 11/28/2015, 9:18 PM   By signing my name below, I, Bobbie Stackhristopher Reid, attest that this documentation has been prepared under the direction and in the presence of Houston SirenPeter Tobias Avitabile, MD. Electronically signed: Bobbie Stackhristopher Reid, Scribe.  11/28/15, 9:30 PM

## 2015-11-28 NOTE — ED Notes (Signed)
Pt called out and is actively vomiting

## 2015-11-28 NOTE — ED Provider Notes (Signed)
[facility]-EMER[facility]Y DEPT Provider Note   CSN: [id number] Arrival date & time: 11/28/15  1457     History   Chief Complaint Chief Complaint  Patient presents with  . Vomiting    HPI Kristina [name] is a 25 y.o. G17P1011 female who presents emergency per with chief complaint of nausea, vomiting and diarrhea. She states that she contracted a "bug" from her husband. He did say symptoms this past Friday. The patient developed nausea, vomiting and diarrhea Sunday night. She had a total episodes of vomiting and watery brown stools yesterday and this morning. Her symptoms have decreased. However, she saw [place] a minor amount of nausea. She did not take any medications prior to coming to the emergency department. She is feeling slightly lightheaded and dehydrated. She denies abdominal pain. She does have some cramping prior to defecation or vomiting. She denies fevers, chills, myalgias or other signs of systemic infection.  HPI  Past Medical History:  Diagnosis Date  . Anemia   . Herpes   . Medical history non-contributory   . Miscarriage   . Pregnant   . Rh negative state in antepartum period   . Vaginal Pap smear, abnormal     Patient Active Problem List   Diagnosis Date Noted  . Depression 11/21/2015  . First trimester bleeding 11/21/2015  . Supervision of normal pregnancy 10/24/2015  . Left ovarian cyst 10/24/2015  . Frequent headaches 09/05/2015  . Obese 09/05/2015  . Genital herpes 08/09/2015  . Rh negative state in antepartum period 11/09/2013  . Abnormal Pap smear of cervix 04/06/2013    Past Surgical History:  Procedure Laterality Date  . CHOLECYSTECTOMY N/A 01/04/2015   Procedure: LAPAROSCOPIC CHOLECYSTECTOMY;  Surgeon: Franky Macho, MD;  Location: [facility]S;  Service: General;  Laterality: N/A;  . TONSILLECTOMY      OB History    Gravida Para Term Preterm AB Living   4 2 2   1 2    SAB TAB Ectopic Multiple Live Births   1     0 2       Home Medications    Prior to  Admission medications   Medication Sig Start Date End Date Taking? Authorizing Provider  acetaminophen (TYLENOL) 325 MG tablet Take 650 mg by mouth every 6 (six) hours as needed.    Historical Provider, MD  Doxylamine-Pyridoxine (DICLEGIS) 10-10 MG TBEC 2 tabs q hs, if sx persist add 1 tab q am on day 3, if sx persist add 1 tab q afternoon on day 4 Patient not taking: Reported on 11/21/2015 10/09/15   Cheral Marker, CNM  escitalopram (LEXAPRO) 10 MG tablet Take 1 tablet (10 mg total) by mouth daily. 11/21/15   Cheral Marker, CNM  Prenatal Vit-Fe Fumarate-FA (PRENATAL VITAMIN PO) Take by mouth.    Historical Provider, MD  promethazine (PHENERGAN) 25 MG tablet Take 0.5-1 tablets (12.5-25 mg total) by mouth every 6 (six) hours as needed for nausea or vomiting. 11/28/15   Cheral Marker, CNM  valACYclovir (VALTREX) 1000 MG tablet Take 1 tablet (1,000 mg total) by mouth 2 (two) times daily. X 10 days Patient not taking: Reported on 10/24/2015 08/08/15   Cheral Marker, CNM    Family History Family History  Problem Relation Age of Onset  . Cancer Maternal Grandmother     breast  . Diabetes Paternal Grandfather   . Jaundice Son     Social History Social History  Substance Use Topics  . Smoking status: Former Smoker  Packs/day: 0.50    Years: 9.00    Types: Cigarettes    Quit date: 01/01/2013  . Smokeless tobacco: Never Used  . Alcohol use No     Comment: occasional     Allergies   Review of patient's allergies indicates no known allergies.   Review of Systems Review of Systems Ten systems reviewed and are negative for acute change, except as noted in the HPI.    Physical Exam Updated Vital Signs BP 105/70 (BP Location: Left Arm)   Pulse 91   Temp 97.7 F (36.5 C) (Oral)   Resp 18   Ht 5\' 7"  (1.702 m)   Wt 110.7 kg   LMP 08/27/2015 (Approximate)   SpO2 100%   BMI 38.22 kg/m   Physical Exam  Constitutional: She is oriented to person, place, and time. She  appears well-developed and well-nourished. No distress.  HENT:  Head: Normocephalic and atraumatic.  Dry oral mucosa  Eyes: Conjunctivae are normal. No scleral icterus.  Neck: Normal range of motion.  Cardiovascular: Normal rate, regular rhythm and normal heart sounds.  Exam reveals no gallop and no friction rub.   No murmur heard. Pulmonary/Chest: Effort normal and breath sounds normal. No respiratory distress.  Abdominal: Soft. Bowel sounds are normal. She exhibits no distension and no mass. There is no tenderness. There is no guarding.  Neurological: She is alert and oriented to person, place, and time.  Skin: Skin is warm and dry. She is not diaphoretic.     ED Treatments / Results  Labs (all labs ordered are listed, but only abnormal results are displayed) Labs Reviewed  COMPREHENSIVE METABOLIC PANEL - Abnormal; Notable for the following:       Result Value   Potassium 3.2 (*)    AST 68 (*)    ALT 77 (*)    All other components within normal limits  CBC  URINALYSIS, ROUTINE W REFLEX MICROSCOPIC (NOT AT [facility])  PREGNANCY, URINE  CBG MONITORING, ED    EKG  EKG Interpretation None       Radiology No results found.  Procedures Procedures (including critical care time)  Medications Ordered in ED Medications  promethazine (PHENERGAN) injection 25 mg (25 mg Intravenous Given 11/28/15 1738)  sodium chloride 0.9 % bolus 1,000 mL (1,000 mLs Intravenous New Bag/Given 11/28/15 1737)     Initial Impression / Assessment and Plan / ED Course  I have reviewed the triage vital signs and the nursing notes.  Pertinent labs & imaging results that were available during my care of the patient were reviewed by me and considered in my medical decision making (see chart for details).  Clinical Course    Patient with symptoms most likely viral gastroenteritis.  Vitals are stable, no fever.Fluids repleted. tolerating PO fluids > 6 oz. Ambulatory and making urine.  Lungs are clear.   No focal abdominal pain, no concern for appendicitis, cholecystitis, pancreatitis, ruptured viscus, UTI, kidney stone, or any other abdominal etiology.  Supportive therapy indicated with return if symptoms worsen.  Discussed return precautions   Final Clinical Impressions(s) / ED Diagnoses   Final diagnoses:  None    New Prescriptions New Prescriptions   PROMETHAZINE (PHENERGAN) 25 MG TABLET    Take 0.5-1 tablets (12.5-25 mg total) by mouth every 6 (six) hours as needed for nausea or vomiting.     Arthor Captainbigail Dru Primeau, PA-C 11/28/15 1945    Blane OharaJoshua Zavitz, MD 11/29/15 [phone]

## 2015-11-28 NOTE — ED Triage Notes (Signed)
Pt has been sick since yesterday. Pt has not been able to keep anything down. Pt has vomited x 7 in past 24 hours. Diarrhea x 7-8 times in past 24 hours.

## 2015-11-28 NOTE — ED Notes (Signed)
Pt given icepack to cool down, pt feels like she is about to pass out, blood sugar taken (83), and vitals wnl.

## 2015-11-29 ENCOUNTER — Encounter (HOSPITAL_COMMUNITY): Payer: Self-pay

## 2015-11-29 DIAGNOSIS — A084 Viral intestinal infection, unspecified: Secondary | ICD-10-CM

## 2015-11-29 DIAGNOSIS — O219 Vomiting of pregnancy, unspecified: Secondary | ICD-10-CM | POA: Diagnosis not present

## 2015-11-29 DIAGNOSIS — E876 Hypokalemia: Secondary | ICD-10-CM

## 2015-11-29 DIAGNOSIS — E860 Dehydration: Secondary | ICD-10-CM

## 2015-11-29 LAB — COMPREHENSIVE METABOLIC PANEL
ALT: 97 U/L — AB (ref 14–54)
AST: 77 U/L — AB (ref 15–41)
Albumin: 3.4 g/dL — ABNORMAL LOW (ref 3.5–5.0)
Alkaline Phosphatase: 65 U/L (ref 38–126)
Anion gap: 9 (ref 5–15)
BILIRUBIN TOTAL: 0.6 mg/dL (ref 0.3–1.2)
BUN: 9 mg/dL (ref 6–20)
CALCIUM: 8.3 mg/dL — AB (ref 8.9–10.3)
CHLORIDE: 103 mmol/L (ref 101–111)
CO2: 22 mmol/L (ref 22–32)
CREATININE: 0.62 mg/dL (ref 0.44–1.00)
GFR calc Af Amer: >60 mL/min (ref >60)
Glucose, Bld: 83 mg/dL (ref 65–99)
Potassium: 2.8 mmol/L — ABNORMAL LOW (ref 3.5–5.1)
Sodium: 134 mmol/L — ABNORMAL LOW (ref 135–145)
Total Protein: 6.6 g/dL (ref 6.5–8.1)

## 2015-11-29 LAB — TSH: TSH: 1.413 u[IU]/mL (ref 0.350–4.500)

## 2015-11-29 MED ORDER — PROMETHAZINE HCL 12.5 MG PO TABS: 12.5000 mg | ORAL_TABLET | Freq: Four times a day (QID) | ORAL | 0 refills | Status: DC | PRN

## 2015-11-29 MED ORDER — ONDANSETRON HCL 4 MG/2ML IJ SOLN
4.0000 mg | Freq: Four times a day (QID) | INTRAMUSCULAR | Status: DC | PRN
  Administered 2015-11-29 (×3): 4 mg via INTRAVENOUS
  Filled 2015-11-29 (×3): qty 2

## 2015-11-29 MED ORDER — ACETAMINOPHEN 325 MG PO TABS
650.0000 mg | ORAL_TABLET | Freq: Four times a day (QID) | ORAL | Status: DC | PRN
  Administered 2015-11-29 (×2): 650 mg via ORAL
  Filled 2015-11-29 (×2): qty 2

## 2015-11-29 MED ORDER — ONDANSETRON HCL 4 MG PO TABS: 4.0000 mg | ORAL_TABLET | Freq: Four times a day (QID) | ORAL | Status: DC | PRN

## 2015-11-29 MED ORDER — INFLUENZA VAC SPLIT QUAD 0.5 ML IM SUSY: 0.5000 mL | PREFILLED_SYRINGE | INTRAMUSCULAR | Status: DC

## 2015-11-29 MED ORDER — POTASSIUM CHLORIDE CRYS ER 20 MEQ PO TBCR
40.0000 meq | EXTENDED_RELEASE_TABLET | ORAL | Status: DC
  Administered 2015-11-29: 40 meq via ORAL
  Filled 2015-11-29: qty 2

## 2015-11-29 MED ORDER — POTASSIUM CHLORIDE 10 MEQ/100ML IV SOLN
10.0000 meq | INTRAVENOUS | Status: AC
  Administered 2015-11-29 (×3): 10 meq via INTRAVENOUS
  Filled 2015-11-29 (×4): qty 100

## 2015-11-29 MED ORDER — POTASSIUM CHLORIDE 20 MEQ/15ML (10%) PO SOLN
40.0000 meq | ORAL | Status: AC
  Administered 2015-11-29 (×2): 40 meq via ORAL
  Filled 2015-11-29 (×2): qty 30

## 2015-11-29 MED ORDER — PRENATAL 27-0.8 MG PO TABS
ORAL_TABLET | Freq: Every day | ORAL | Status: DC
  Administered 2015-11-29: 1 via ORAL
  Filled 2015-11-29 (×3): qty 1

## 2015-11-29 MED ORDER — POTASSIUM CHLORIDE CRYS ER 20 MEQ PO TBCR: 40.0000 meq | EXTENDED_RELEASE_TABLET | ORAL | 0 refills | Status: DC

## 2015-11-29 MED ORDER — POTASSIUM CHLORIDE IN NACL 20-0.9 MEQ/L-% IV SOLN
INTRAVENOUS | Status: DC
  Administered 2015-11-29: via INTRAVENOUS

## 2015-11-29 NOTE — Progress Notes (Signed)
Pt was not able to tolerate PO kdur.  Notified Dr Ardyth HarpsHernandez, ok to change to liquid.

## 2015-11-29 NOTE — Telephone Encounter (Signed)
Pt talked with Joellyn HaffKim Booker, CNM through My Chart and went to ED.

## 2015-11-29 NOTE — Progress Notes (Signed)
Pt not able to tolerate liquid potassium.  Paging Dr Ardyth HarpsHernandez to see if able to offer other solution at this time.

## 2015-11-29 NOTE — Discharge Summary (Signed)
Physician Discharge Summary  Kristina [name] ZOX:[id number] DOB: October 03, 1990 DOA: 11/28/2015  [facility]  Admit date: 11/28/2015 Discharge date: 11/29/2015  Time spent: 45 minutes  Recommendations for Outpatient Follow-up:  -Will be discharged home today. -Advised to follow up with her OB/GYN as scheduled.   Discharge Diagnoses:  Active Problems:   Dehydration, moderate   Gastroenteritis   Hypokalemia   Pregnancy   Viral gastroenteritis   Discharge Condition: Stable and improved  Filed Weights   11/28/15 1502 11/29/15 0015  Weight: 110.7 kg (244 lb) 107.9 kg (237 lb 14 oz)    History of present illness:  As per Dr. Conley RollsLe on 9/19: Kristina [name] with 13 week pregnancy, G4P4, with  no significant medical history presents to the ED with complaints of N/V/D that onset Sunday. She admits that both her daughter and husband are having similar GI symptoms, lasting 2-3 days and resolved. She denies any recent travel history. Denies bleeding and abd pain. She is currently on her forth pregnancy and is [redacted] weeks pregnant. Her OB-GYN is Dr Furgerson, and she has had no complication.  Hospitalist was asked to admit her for IVF and K replacement, along with antiemetics.   Hospital Course:   N/V -Likely represents hyperemesis gravidarum vs viral gastroenteritis. -Is symptomatically improved today and wishes to go home. -Discussed via phone with Dr. Eure: recommends dicliges and phenergan as needed.  Rest of chronic conditions have been stable.  Procedures:  None   Consultations:  None  Discharge Instructions  Discharge Instructions    Diet - low sodium heart healthy    Complete by:  As directed    Increase activity slowly    Complete by:  As directed       No Known Allergies Follow-up Information    [facility] In 1 week.   Why:  Call office for appointment Contact information: [address] [place] [address] [phone]            The results of significant diagnostics from this hospitalization (including imaging, microbiology, ancillary and laboratory) are listed below for reference.    Significant Diagnostic Studies: Us Fetal Nuchal Translucency Measurement  Result Date: 11/21/2015 NUCHAL TRANSLUCENCY FOR INTEGRATED TESTING Kristina [name] is in the office for nuchal translucency sonogram as part of an integrated screen. She is a 25 y.o. year old G4P2012 with Estimated Date of Delivery: 06/02/16 by LMP now at  [redacted]w[redacted]d weeks gestation. Thus far the pregnancy has been complicated by obesity,lt ov cyst.. GESTATION:SINGLETON FETAL ACTIVITY:         Heart rate         172          The fetus is active. AMNIOTIC FLUID:The amniotic fluid volume is  normal PLACENTA LOCALIZATION:  posterior GRADE 0 CERVIX: Appears closed ADNEXA: Normal rt ov,simple cyst lt ov 5.7 x 4.8 x 3.8 cm GESTATIONAL AGE AND  BIOMETRICS: Gestational criteria: Estimated Date of Delivery: 06/02/16 by LMP now at [redacted]w[redacted]d Previous Scans:1    CROWN RUMP LENGTH           54.7 mm         11 +5 weeks NUCHAL TRANSLUCENCY           1.2 mm         normal  AVERAGE EGA(BY THIS SCAN):  11+5 weeks The fetal nasal bone is identified.  TECHNICIAN COMMENTS: [place] 12+2 wks,measurements c/w dates,CRL 54.7 mm,NB present,NT 1.2 mm,normal rt ov,simple lt ov cyst 5.7 4.8 x 3.8 cm The patient will have the first blood draw of her integrated screening today and the second draw in approximately 4 weeks. Kristina [name] 11/21/2015 10:44 AM Clinical Impression and recommendations: I have reviewed the sonogram results above, combined with the patient's current clinical course, below are my impressions and any appropriate recommendations for management based on the sonographic findings. 1.  [id number] Estimated Date of Delivery: 06/02/16 by  best clinical estimate and confirmed by today's  sonographic dating 2.  Normal fetal sonographic findings, specifically normal nuchal translucency and present fetal nasal bone 3.  Normal general sonographic findings Recommend routine prenatal care based on this sonogram or as clinically indicated FERGUSON,JOHN V 11/21/2015 11:29 AM                                                   Microbiology: No results found for this or any previous visit (from the past 240 hour(s)).   Labs: Basic Metabolic Panel:  Recent Labs Lab 11/28/15 1505 11/29/15 0731  NA 136 134*  K 3.2* 2.8*  CL 101 103  CO2 24 22  GLUCOSE 94 83  BUN 9 9  CREATININE 0.72 0.62  CALCIUM 8.9 8.3*   Liver Function Tests:  Recent Labs Lab 11/28/15 1505 11/29/15 0731  AST 68* 77*  ALT 77* 97*  ALKPHOS 76 65  BILITOT 0.5 0.6  PROT 7.5 6.6  ALBUMIN 3.9 3.4*   No results for input(s): LIPASE, AMYLASE in the last 168 hours. No results for input(s): AMMONIA in the last 168 hours. CBC:  Recent Labs Lab 11/28/15 1505  WBC 4.8  HGB 13.8  HCT 41.2  MCV 81.3  PLT 191   Cardiac Enzymes: No results for input(s): CKTOTAL, CKMB, CKMBINDEX, TROPONINI in the last 168 hours. BNP: BNP (last 3 results) No results for input(s): BNP in the last 8760 hours.  ProBNP (last 3 results) No results for input(s): PROBNP in the last 8760 hours.  CBG:  Recent Labs Lab 11/28/15 1639  GLUCAP 83       Signed:  HERNANDEZ ACOSTA,ESTELA  [facility] Pager: [phone] 11/29/2015, 4:26 PM

## 2015-11-30 DIAGNOSIS — O219 Vomiting of pregnancy, unspecified: Secondary | ICD-10-CM | POA: Diagnosis not present

## 2015-11-30 LAB — BASIC METABOLIC PANEL
Anion gap: 6 (ref 5–15)
CO2: 22 mmol/L (ref 22–32)
Calcium: 8.3 mg/dL — ABNORMAL LOW (ref 8.9–10.3)
Chloride: 108 mmol/L (ref 101–111)
Creatinine, Ser: 0.64 mg/dL (ref 0.44–1.00)
GFR calc Af Amer: >60 mL/min (ref >60)
GLUCOSE: 98 mg/dL (ref 65–99)
Potassium: 3.1 mmol/L — ABNORMAL LOW (ref 3.5–5.1)
Sodium: 136 mmol/L (ref 135–145)

## 2015-11-30 NOTE — Progress Notes (Signed)
Pt IV removed, tolerated well.  Reviewed discharge instructions with pt and answered questions at this time.   

## 2015-12-06 ENCOUNTER — Ambulatory Visit (INDEPENDENT_AMBULATORY_CARE_PROVIDER_SITE_OTHER): Payer: BLUE CROSS/BLUE SHIELD | Admitting: Obstetrics and Gynecology

## 2015-12-06 VITALS — BP 92/68 | HR 84 | Wt 237.0 lb

## 2015-12-06 DIAGNOSIS — Z331 Pregnant state, incidental: Secondary | ICD-10-CM

## 2015-12-06 DIAGNOSIS — Z1389 Encounter for screening for other disorder: Secondary | ICD-10-CM

## 2015-12-06 DIAGNOSIS — Z3492 Encounter for supervision of normal pregnancy, unspecified, second trimester: Secondary | ICD-10-CM

## 2015-12-06 LAB — POCT URINALYSIS DIPSTICK
GLUCOSE UA: NEGATIVE
KETONES UA: NEGATIVE
Leukocytes, UA: NEGATIVE
NITRITE UA: NEGATIVE
Protein, UA: NEGATIVE

## 2015-12-06 NOTE — Progress Notes (Signed)
Patient ID: Kristina [name], female   DOB: Jan 29, 1991, 25 y.o.   MRN: [id number]  [id number]  Estimated Date of Delivery: 06/02/16 LROB 194w3d  Blood pressure 92/68, pulse 84, weight 237 lb (107.5 kg), last menstrual period 08/27/2015, unknown if currently breastfeeding.    Urine results: notable for small blood   Chief Complaint  Patient presents with  . w/i    follow-up from hospital discharge for dehydration and low potassium     Patient complaints: none. Pt was recently hospitalized from 11/25/15-11/29/15 for treatment of dehydration, viral gastroenteritis, hypokalemia with IVF and K replacement along with antiemetics. She states she currently feels well.   Patient reports too early for fetal movement. She denies any bleeding, rupture of membranes, or regular contractions.   Refer to the ob flow sheet for FH and FHR.    Physical Examination: General appearance - alert, well appearing, and in no distress                                      Abdomen -FHR 156 bpm                                                         soft, nontender, nondistended, no masses or organomegaly                                            Questions were answered. Assessment: LROB [id number] @ 524w3d  Well-appearing s/p hospitalization   Plan:  Continued routine obstetrical care  F/u in 4 weeks for routine prenatal care, on 12/20/15 for colposcopy as scheduled     By signing my name below, I, Doreatha MartinEva Mathews, attest that this documentation has been prepared under the direction and in the presence of Tilda BurrowJohn V Osama Coleson, MD. Electronically Signed: Doreatha MartinEva Mathews, ED Scribe. 12/06/15. 11:56 AM.  I personally performed the services described in this documentation, which was SCRIBED in my presence. The recorded information has been reviewed and considered accurate. It has been edited as necessary during review. Tilda BurrowFERGUSON,Nicholaos Schippers V, MD

## 2015-12-06 NOTE — Progress Notes (Signed)
Dark brown blood discharge 3 days ago

## 2015-12-20 ENCOUNTER — Ambulatory Visit (INDEPENDENT_AMBULATORY_CARE_PROVIDER_SITE_OTHER): Payer: BLUE CROSS/BLUE SHIELD | Admitting: Obstetrics and Gynecology

## 2015-12-20 VITALS — BP 116/70 | HR 90 | Wt 234.4 lb

## 2015-12-20 DIAGNOSIS — R87619 Unspecified abnormal cytological findings in specimens from cervix uteri: Secondary | ICD-10-CM | POA: Diagnosis not present

## 2015-12-20 DIAGNOSIS — Z1389 Encounter for screening for other disorder: Secondary | ICD-10-CM

## 2015-12-20 DIAGNOSIS — Z331 Pregnant state, incidental: Secondary | ICD-10-CM | POA: Diagnosis not present

## 2015-12-20 DIAGNOSIS — Z3402 Encounter for supervision of normal first pregnancy, second trimester: Secondary | ICD-10-CM

## 2015-12-20 DIAGNOSIS — Z3682 Encounter for antenatal screening for nuchal translucency: Secondary | ICD-10-CM

## 2015-12-20 LAB — POCT URINALYSIS DIPSTICK
GLUCOSE UA: NEGATIVE
KETONES UA: NEGATIVE
Leukocytes, UA: NEGATIVE
Nitrite, UA: NEGATIVE
RBC UA: NEGATIVE

## 2015-12-20 NOTE — Progress Notes (Signed)
  Kristina [name] 25 y.o. [id number] here for colposcopy for atypical glandular cells, pap smear on 09/05/15.  Discussed role for HPV in cervical dysplasia, need for surveillance.  Patient given informed consent, signed copy in the chart, time out was performed.  Placed in lithotomy position. Cervix viewed with speculum and colposcope after application of acetic acid.     Smooth appearing cervix, no visible dysplasia; biopsies not obtained.  ECC specimen not obtained. All specimens were labelled and sent to pathology.   Colposcopy IMPRESSION: AGC on pap, no visible dysplasia. Smooth appearing cervix, no visible dysplasia, ECC  Patient was given post procedure instructions. Will follow up pathology and manage accordingly.  Routine preventative health maintenance measures emphasized.   By signing my name below, I, Sonum Patel, attest that this documentation has been prepared under the direction and in the presence of Tilda BurrowJohn V Jjesus Dingley, MD. Electronically Signed: Sonum Patel, Neurosurgeoncribe. 12/20/15. 9:36 AM.  I personally performed the services described in this documentation, which was SCRIBED in my presence. The recorded information has been reviewed and considered accurate. It has been edited as necessary during review. Tilda BurrowFERGUSON,Zygmunt Mcglinn V, MD

## 2015-12-22 LAB — MATERNAL SCREEN, INTEGRATED #2
AFP MARKER: 50.9 ng/mL
AFP MOM: 2.38
CROWN RUMP LENGTH: 54.7 mm
DIA MOM: 1.55
DIA Value: 206.8 pg/mL
ESTRIOL UNCONJUGATED: 0.52 ng/mL
GEST. AGE ON COLLECTION DATE: 12.1 wk
GESTATIONAL AGE: 16.3 wk
Maternal Age at EDD: 28 years
NUCHAL TRANSLUCENCY MOM: 0.83
Nuchal Translucency (NT): 1.2 mm
Number of Fetuses: 1
PAPP-A MOM: 0.65
PAPP-A Value: 296.9 ng/mL
TEST RESULTS: NEGATIVE
WEIGHT: 244 [lb_av]
Weight: 244 [lb_av]
hCG MoM: 2.49
hCG Value: 56.1 IU/mL
uE3 MoM: 0.7

## 2015-12-24 ENCOUNTER — Encounter: Payer: Self-pay | Admitting: Women's Health

## 2015-12-26 ENCOUNTER — Encounter (HOSPITAL_COMMUNITY): Payer: Self-pay

## 2015-12-26 ENCOUNTER — Telehealth: Payer: Self-pay | Admitting: *Deleted

## 2015-12-26 ENCOUNTER — Inpatient Hospital Stay (HOSPITAL_COMMUNITY)
Admission: AD | Admit: 2015-12-26 | Discharge: 2015-12-26 | Disposition: A | Discharge status: Home / Self Care | Payer: BLUE CROSS/BLUE SHIELD | Source: Ambulatory Visit | Attending: Obstetrics & Gynecology | Admitting: Obstetrics & Gynecology

## 2015-12-26 DIAGNOSIS — O26892 Other specified pregnancy related conditions, second trimester: Secondary | ICD-10-CM | POA: Insufficient documentation

## 2015-12-26 DIAGNOSIS — Z87891 Personal history of nicotine dependence: Secondary | ICD-10-CM | POA: Insufficient documentation

## 2015-12-26 DIAGNOSIS — S3992XA Unspecified injury of lower back, initial encounter: Secondary | ICD-10-CM

## 2015-12-26 DIAGNOSIS — R109 Unspecified abdominal pain: Secondary | ICD-10-CM | POA: Diagnosis not present

## 2015-12-26 DIAGNOSIS — O9A212 Injury, poisoning and certain other consequences of external causes complicating pregnancy, second trimester: Secondary | ICD-10-CM | POA: Diagnosis not present

## 2015-12-26 DIAGNOSIS — W109XXA Fall (on) (from) unspecified stairs and steps, initial encounter: Secondary | ICD-10-CM | POA: Insufficient documentation

## 2015-12-26 DIAGNOSIS — O26899 Other specified pregnancy related conditions, unspecified trimester: Secondary | ICD-10-CM

## 2015-12-26 DIAGNOSIS — Z3A17 17 weeks gestation of pregnancy: Secondary | ICD-10-CM | POA: Insufficient documentation

## 2015-12-26 LAB — URINE MICROSCOPIC-ADD ON

## 2015-12-26 LAB — URINALYSIS, ROUTINE W REFLEX MICROSCOPIC
BILIRUBIN URINE: NEGATIVE
GLUCOSE, UA: NEGATIVE mg/dL
KETONES UR: NEGATIVE mg/dL
Nitrite: NEGATIVE
PROTEIN: NEGATIVE mg/dL
Specific Gravity, Urine: 1.025 (ref 1.005–1.030)
pH: 6 (ref 5.0–8.0)

## 2015-12-26 NOTE — MAU Note (Signed)
Pt presents complaining of stomach pain after a fall. States she fell on her bottom around 1600 today. Denies bleeding or leaking. Has not started feeling fetal movement in this pregnancy yet. Has not tried anything for the pain.

## 2015-12-26 NOTE — Telephone Encounter (Signed)
Pt called stating she fell down 5 steps. Pt is having no pain or spotting. I spoke with Chrystal, RN and pt was advised to schedule an appt for tomorrow since pt is having no spotting or pain. Pt was advised if things changed after hours, call the after hours nurse line for advise. Pt voiced understanding. JSY

## 2015-12-26 NOTE — MAU Provider Note (Signed)
History     CSN: [id number]  Arrival date and time: 12/26/15 2252   First Provider Initiated Contact with Patient 12/26/15 2328      Chief Complaint  Patient presents with  . Abdominal Pain  . Fall   HPI Kristina [name] is a 25 y.o. [phone] at 4758w2d who presents with abdominal pain after fall. Reports falling down 3 steps in her home this afternoon at 4 pm. Landed on bottom and slid down remaining steps. Did not hit abdomen & denies LOF. Reports intermittent sharp abdominal pain that feels like gas pain. Rates pain 3/10. Has not treated. Nothing makes better or worse. Denies vaginal bleeding or LOF. Denies n/v/d. [facility] scheduled to be evaluated for fall.   OB History    Gravida Para Term Preterm AB Living   4 2 2   1 2    SAB TAB Ectopic Multiple Live Births   1     0 2      Past Medical History:  Diagnosis Date  . Anemia   . Herpes   . Miscarriage   . Rh negative state in antepartum period   . Vaginal Pap smear, abnormal     Past Surgical History:  Procedure Laterality Date  . CHOLECYSTECTOMY N/A 01/04/2015   Procedure: LAPAROSCOPIC CHOLECYSTECTOMY;  Surgeon: Franky MachoMark Jenkins, MD;  Location: [facility];  Service: General;  Laterality: N/A;  . TONSILLECTOMY      Family History  Problem Relation Age of Onset  . Cancer Maternal Grandmother     breast  . Diabetes Paternal Grandfather   . Jaundice Son     Social History  Substance Use Topics  . Smoking status: Former Smoker    Packs/day: 0.50    Years: 9.00    Types: Cigarettes    Quit date: 01/01/2013  . Smokeless tobacco: Never Used  . Alcohol use No     Comment: occasional    Allergies: No Known Allergies  Prescriptions Prior to Admission  Medication Sig Dispense Refill Last Dose  . acetaminophen (TYLENOL) 325 MG tablet Take 650 mg by mouth every 6 (six) hours as needed.   Taking  . Prenatal Vit-Fe Fumarate-FA (PRENATAL VITAMIN PO) Take 1 tablet by mouth daily.    Taking     Review of Systems  Constitutional: Negative.   Gastrointestinal: Positive for abdominal pain. Negative for constipation, diarrhea, nausea and vomiting.  Genitourinary: Negative.    Physical Exam   Blood pressure 130/78, pulse 97, temperature 98 F (36.7 C), temperature source Oral, resp. rate 18, last menstrual period 08/27/2015, unknown if currently breastfeeding.  Physical Exam  Nursing note and vitals reviewed. Constitutional: She is oriented to person, place, and time. She appears well-developed and well-nourished. No distress.  HENT:  Head: Normocephalic and atraumatic.  Eyes: Conjunctivae are normal. Right eye exhibits no discharge. Left eye exhibits no discharge. No scleral icterus.  Neck: Normal range of motion.  Respiratory: Effort normal. No respiratory distress.  GI: Soft. There is no tenderness. There is no rebound and no guarding.  Genitourinary:  Genitourinary Comments: Cervix closed/thick  Neurological: She is alert and oriented to person, place, and time.  Skin: Skin is warm and dry. She is not diaphoretic.  Psychiatric: She has a normal mood and affect. Her behavior is normal. Judgment and thought content normal.    MAU Course  Procedures Results for orders placed or performed during the hospital encounter of 12/26/15 (from the past 24 hour(s))  Urinalysis, Routine w reflex microscopic (not at [facility])     Status: Abnormal   Collection Time: 12/26/15 10:51 PM  Result Value Ref Range   Color, Urine YELLOW YELLOW   APPearance CLEAR CLEAR   Specific Gravity, Urine 1.025 1.005 - 1.030   pH 6.0 5.0 - 8.0   Glucose, UA NEGATIVE NEGATIVE mg/dL   Hgb urine dipstick TRACE (A) NEGATIVE   Bilirubin Urine NEGATIVE NEGATIVE   Ketones, ur NEGATIVE NEGATIVE mg/dL   Protein, ur NEGATIVE NEGATIVE mg/dL   Nitrite NEGATIVE NEGATIVE   Leukocytes, UA TRACE (A) NEGATIVE  Urine microscopic-add on     Status: Abnormal   Collection Time: 12/26/15 10:51 PM  Result Value Ref  Range   Squamous Epithelial / LPF 0-5 (A) NONE SEEN   WBC, UA 0-5 0 - 5 WBC/hpf   RBC / HPF 0-5 0 - 5 RBC/hpf   Bacteria, UA FEW (A) NONE SEEN   Urine-Other AMORPHOUS URATES/PHOSPHATES     MDM FHT 157 by doppler Cervix closed NAD, VSS Abdomen soft & nontender Assessment and Plan  A: 1. Fall (on) (from) unspecified stairs and steps, initial encounter   2. Abdominal pain affecting pregnancy    P: Discharge home Discussed reasons to return to MAU Call office in morning to discuss f/u appt  Judeth Horn 12/26/2015, 11:28 PM

## 2015-12-26 NOTE — Discharge Instructions (Signed)
What Do I Need to Know About Injuries During Pregnancy? °Trauma is the most common cause of injury and death in pregnant women. This can also result in significant harm or death of the baby. °Your baby is protected in the womb (uterus) by a sac filled with fluid (amniotic sac). Your baby can be harmed if there is direct, high-impact trauma to your abdomen and pelvis. This type of trauma can result in tearing of your uterus, the placenta pulling away from the wall of the uterus (placenta abruption), or the amniotic sac breaking open (rupture of membranes). These injuries can decrease or stop the blood supply to your baby or cause you to go into labor earlier than expected. Minor falls and low-impact automobile accidents do not usually harm your baby, even if they do minimally harm you. °WHAT KIND OF INJURIES CAN AFFECT MY PREGNANCY? °The most common causes of injury or death to a baby include: °· Falls. Falls are more common in the second and third trimester of the pregnancy. Factors that increase your risk of falling include: °¨ Increase in your weight. °¨ The change in your center of gravity. °¨ Tripping over an object that cannot be seen. °¨ Increased looseness (laxity) of your ligaments resulting in less coordinated movements (you may feel clumsy). °¨ Falling during high-risk activities like horseback riding or skiing. °· Automobile accidents. It is important to wear your seat belt properly, with the lap belt below your abdomen, and always practice safe driving. °· Domestic violence or assault. °· Burns (fire or electrical). °The most common causes of injury or death to the pregnant woman include: °· Injuries that cause severe bleeding, shock, and loss of blood flow to major organs. °· Head and neck injuries that result in severe brain or spinal damage. °· Chest trauma that can cause direct injury to the heart and lungs or any injury that affects the area enclosed by the ribs. Trauma to this area can result in  cardiorespiratory arrest. °WHAT CAN I DO TO PROTECT MYSELF AND MY BABY FROM INJURY WHILE I AM PREGNANT? °· Remove slippery rugs and loose objects on the floor that increase your risk of tripping. °· Avoid walking on wet or slippery floors. °· Wear comfortable shoes that have a good grip on the sole. Do not wear high-heeled shoes. °· Always wear your seat belt properly, with the lap belt below your abdomen, and always practice safe driving. Do not ride on a motorcycle while pregnant. °· Do not participate in high-impact activities or sports. °· Avoid fires, starting fires, lifting heavy pots of boiling or hot liquids, and fixing electrical problems. °· Only take over-the-counter or prescription medicines for pain, fever, or discomfort as directed by your health care provider. °· Know your blood type and the father's blood type in case you develop vaginal bleeding or experience an injury for which a blood transfusion may be necessary. °· Call your local emergency services (911 in the [place] if you are a victim of domestic violence or assault. Spousal abuse can be a significant cause of trauma during pregnancy. For help and support, contact the National Domestic Violence Hotline. °WHEN SHOULD I SEEK IMMEDIATE MEDICAL CARE?  °· You fall on your abdomen or experience any high-force accident or injury. °· You have been assaulted (domestic or otherwise). °· You have been in a car accident. °· You develop vaginal bleeding. °· You develop fluid leaking from the vagina. °· You develop uterine contractions (pelvic cramping, pain, or significant low back   pain). °· You become weak or faint, or have uncontrolled vomiting after trauma. °· You had a serious burn. This includes burns to the face, neck, hands, or genitals, or burns greater than the size of your palm anywhere else. °· You develop neck stiffness or pain after a fall or from other trauma. °· You develop a headache or vision problems after a fall or from other  trauma. °· You do not feel the baby moving or the baby is not moving as much as before a fall or other trauma. °  °This information is not intended to replace advice given to you by your health care provider. Make sure you discuss any questions you have with your health care provider. °  °Document Released: 04/04/2004 Document Revised: 03/18/2014 Document Reviewed: 12/02/2012 °Elsevier Interactive Patient Education ©2016 Elsevier Inc. ° °

## 2015-12-27 ENCOUNTER — Encounter: Payer: BLUE CROSS/BLUE SHIELD | Admitting: Advanced Practice Midwife

## 2016-01-15 ENCOUNTER — Other Ambulatory Visit: Payer: Self-pay | Admitting: Obstetrics and Gynecology

## 2016-01-15 DIAGNOSIS — Z363 Encounter for antenatal screening for malformations: Secondary | ICD-10-CM

## 2016-01-17 ENCOUNTER — Encounter: Payer: Self-pay | Admitting: Advanced Practice Midwife

## 2016-01-17 ENCOUNTER — Ambulatory Visit (INDEPENDENT_AMBULATORY_CARE_PROVIDER_SITE_OTHER): Payer: BLUE CROSS/BLUE SHIELD

## 2016-01-17 ENCOUNTER — Ambulatory Visit (INDEPENDENT_AMBULATORY_CARE_PROVIDER_SITE_OTHER): Payer: BLUE CROSS/BLUE SHIELD | Admitting: Advanced Practice Midwife

## 2016-01-17 VITALS — BP 110/70 | HR 74 | Wt 230.3 lb

## 2016-01-17 DIAGNOSIS — Z363 Encounter for antenatal screening for malformations: Secondary | ICD-10-CM | POA: Diagnosis not present

## 2016-01-17 DIAGNOSIS — Z3482 Encounter for supervision of other normal pregnancy, second trimester: Secondary | ICD-10-CM

## 2016-01-17 DIAGNOSIS — Z3402 Encounter for supervision of normal first pregnancy, second trimester: Secondary | ICD-10-CM

## 2016-01-17 DIAGNOSIS — Z331 Pregnant state, incidental: Secondary | ICD-10-CM

## 2016-01-17 DIAGNOSIS — Z3A21 21 weeks gestation of pregnancy: Secondary | ICD-10-CM | POA: Diagnosis not present

## 2016-01-17 DIAGNOSIS — Z1389 Encounter for screening for other disorder: Secondary | ICD-10-CM

## 2016-01-17 LAB — POCT URINALYSIS DIPSTICK
GLUCOSE UA: NEGATIVE
KETONES UA: NEGATIVE
Leukocytes, UA: NEGATIVE
Nitrite, UA: NEGATIVE
RBC UA: NEGATIVE

## 2016-01-17 MED ORDER — ONDANSETRON HCL 4 MG PO TABS: 4.0000 mg | ORAL_TABLET | Freq: Three times a day (TID) | ORAL | 1 refills | Status: DC | PRN

## 2016-01-17 NOTE — Patient Instructions (Signed)

## 2016-01-17 NOTE — Progress Notes (Signed)
[id number] 778w3d Estimated Date of Delivery: 06/02/16  Blood pressure 110/70, pulse 74, weight 230 lb 4.8 oz (104.5 kg), last menstrual period 08/27/2015, unknown if currently breastfeeding.   BP weight and urine results all reviewed and noted.  Please refer to the obstetrical flow sheet for the fundal height and fetal heart rate documentation:  US 20+3 wks,cephalic,post pl gr 0,normal ov's bilat,cx 4.3 cm,fhr 150 bpm,EFW 390 bpm,svp of fluid 5.5 cm,anatomy complete,no obvious abnormalities seen  Patient reports good fetal movement, denies any bleeding and no rupture of membranes symptoms or regular contractions. Patient is without complaints. All questions were answered.  Orders Placed This Encounter  Procedures  . POCT urinalysis dipstick    Plan:  Continued routine obstetrical care,   Return in about 4 weeks (around 02/14/2016) for LROB.

## 2016-01-17 NOTE — Progress Notes (Signed)
US 20+3 wks,cephalic,post pl gr 0,normal ov's bilat,cx 4.3 cm,fhr 150 bpm,EFW 390 bpm,svp of fluid 5.5 cm,anatomy complete,no obvious abnormalities seen

## 2016-02-14 ENCOUNTER — Ambulatory Visit (INDEPENDENT_AMBULATORY_CARE_PROVIDER_SITE_OTHER): Payer: BLUE CROSS/BLUE SHIELD | Admitting: Advanced Practice Midwife

## 2016-02-14 ENCOUNTER — Encounter: Payer: Self-pay | Admitting: Advanced Practice Midwife

## 2016-02-14 VITALS — BP 130/58 | HR 88 | Wt 233.0 lb

## 2016-02-14 DIAGNOSIS — Z1389 Encounter for screening for other disorder: Secondary | ICD-10-CM

## 2016-02-14 DIAGNOSIS — Z3482 Encounter for supervision of other normal pregnancy, second trimester: Secondary | ICD-10-CM

## 2016-02-14 DIAGNOSIS — Z3A25 25 weeks gestation of pregnancy: Secondary | ICD-10-CM

## 2016-02-14 DIAGNOSIS — Z331 Pregnant state, incidental: Secondary | ICD-10-CM

## 2016-02-14 DIAGNOSIS — O368390 Maternal care for abnormalities of the fetal heart rate or rhythm, unspecified trimester, not applicable or unspecified: Secondary | ICD-10-CM | POA: Insufficient documentation

## 2016-02-14 LAB — POCT URINALYSIS DIPSTICK
Glucose, UA: NEGATIVE
Ketones, UA: NEGATIVE
NITRITE UA: NEGATIVE
PROTEIN UA: NEGATIVE
RBC UA: NEGATIVE

## 2016-02-14 NOTE — Progress Notes (Signed)
[id number] 7663w3d Estimated Date of Delivery: 06/02/16  Blood pressure (!) 130/58, pulse 88, weight 233 lb (105.7 kg), last menstrual period 08/27/2015, unknown if currently breastfeeding.   BP weight and urine results all reviewed and noted.  Please refer to the obstetrical flow sheet for the fundal height and fetal heart rate documentation: Fetus has an (almost) regular irregularity to the heartbeat today.  Seems to skip every third beat then make it up w/2 rapid beats afterwards.    Patient reports good fetal movement, denies any bleeding and no rupture of membranes symptoms or regular contractions. Patient is without complaints. All questions were answered.  Orders Placed This Encounter  Procedures  . POCT urinalysis dipstick    Plan:  Continued routine obstetrical care,  [facility]t 12/14   Return in about 3 weeks (around 03/06/2016) for PN2/LROB, LROB.

## 2016-02-14 NOTE — Patient Instructions (Addendum)
1. Before your test, do not eat or drink anything for 8-10 hours prior to your  appointment (a small amount of water is allowed and you may take any medicines you normally take). Be sure to drink lots of water the day before. 2. When you arrive, your blood will be drawn for a 'fasting' blood sugar level.  Then you will be given a sweetened carbonated beverage to drink. You should  complete drinking this beverage within five minutes. After finishing the  beverage, you will have your blood drawn exactly 1 and 2 hours later. Having  your blood drawn on time is an important part of this test. A total of three blood  samples will be done. 3. The test takes approximately 2  hours. During the test, do not have anything to  eat or drink. Do not smoke, chew gum (not even sugarless gum) or use breath mints.  4. During the test you should remain close by and seated as much as possible and  avoid walking around. You may want to bring a book or something else to  occupy your time.  5. After your test, you may eat and drink as normal. You may want to bring a snack  to eat after the test is finished. Your provider will advise you as to the results of  this test and any follow-up if necessary  If your sugar test is positive for gestational diabetes, you will be given an phone call and further instructions discussed. If you wish to know all of your test results before your next appointment, feel free to call the office, or look up your test results on Mychart.  (The range that the lab uses for normal values of the sugar test are not necessarily the range that is used for pregnant women; if your results are within the normal range, they are definitely normal.  However, if a value is deemed "high" by the lab, it may not be too high for a pregnant woman.  We will need to discuss the results if your value(s) fall in the "high" category).     Tdap Vaccine  It is recommended that you get the Tdap vaccine during the  third trimester of EACH pregnancy to help protect your baby from getting pertussis (whooping cough)  27-36 weeks is the BEST time to do this so that you can pass the protection on to your baby. During pregnancy is better than after pregnancy, but if you are unable to get it during pregnancy it will be offered at the hospital.  You can get this vaccine at the health department or your family doctor, as well as some pharmacies.  Everyone who will be around your baby should also be up-to-date on their vaccines. Adults (who are not pregnant) only need 1 dose of Tdap during adulthood.    [facility] Dr. Mayer Camelatum  12/14 at [address]9 am [address] [place], [place] [address] [phone]

## 2016-03-08 ENCOUNTER — Other Ambulatory Visit: Payer: BLUE CROSS/BLUE SHIELD

## 2016-03-08 ENCOUNTER — Encounter: Payer: Self-pay | Admitting: Women's Health

## 2016-03-08 ENCOUNTER — Ambulatory Visit (INDEPENDENT_AMBULATORY_CARE_PROVIDER_SITE_OTHER): Payer: BLUE CROSS/BLUE SHIELD | Admitting: Women's Health

## 2016-03-08 VITALS — BP 123/68 | HR 94 | Wt 234.0 lb

## 2016-03-08 DIAGNOSIS — Z1389 Encounter for screening for other disorder: Secondary | ICD-10-CM

## 2016-03-08 DIAGNOSIS — Z3483 Encounter for supervision of other normal pregnancy, third trimester: Secondary | ICD-10-CM | POA: Diagnosis not present

## 2016-03-08 DIAGNOSIS — O23593 Infection of other part of genital tract in pregnancy, third trimester: Secondary | ICD-10-CM | POA: Diagnosis not present

## 2016-03-08 DIAGNOSIS — Z3482 Encounter for supervision of other normal pregnancy, second trimester: Secondary | ICD-10-CM

## 2016-03-08 DIAGNOSIS — A6000 Herpesviral infection of urogenital system, unspecified: Secondary | ICD-10-CM

## 2016-03-08 DIAGNOSIS — Z3A28 28 weeks gestation of pregnancy: Secondary | ICD-10-CM | POA: Diagnosis not present

## 2016-03-08 DIAGNOSIS — Z331 Pregnant state, incidental: Secondary | ICD-10-CM

## 2016-03-08 DIAGNOSIS — Z131 Encounter for screening for diabetes mellitus: Secondary | ICD-10-CM

## 2016-03-08 LAB — POCT URINALYSIS DIPSTICK
Blood, UA: NEGATIVE
Ketones, UA: NEGATIVE
LEUKOCYTES UA: NEGATIVE
NITRITE UA: NEGATIVE
PROTEIN UA: NEGATIVE

## 2016-03-08 MED ORDER — ACYCLOVIR 400 MG PO TABS: 400.0000 mg | ORAL_TABLET | Freq: Three times a day (TID) | ORAL | 3 refills | Status: DC

## 2016-03-08 NOTE — Progress Notes (Signed)
Low-risk OB appointment [id number] 820w5d Estimated Date of Delivery: 06/02/16 BP 123/68   Pulse 94   Wt 234 lb (106.1 kg)   LMP 08/27/2015 (Approximate)   BMI 36.65 kg/m   BP, weight, and urine reviewed.  Refer to obstetrical flow sheet for FH & FHR. Reports good fm.  Denies regular uc's, lof, vb, or uti s/s. [facility] d/t irregular fetal heart rate- all normal. Pelvic bone pain- can try pregnancy belt/kinesthesiology tape. 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 Rx acyclovir 400mg  TID.  Wants interval salpingectomy, discussed risks/benefits, consent signed today FHR normal today Reviewed ptl s/s, fkc. Recommended Tdap at HD/PCP per CDC guidelines.  Plan:  Continue routine obstetrical care  F/U in 4wks for OB appointment  PN2 today

## 2016-03-08 NOTE — Patient Instructions (Signed)

## 2016-03-09 LAB — RPR: RPR: NONREACTIVE

## 2016-03-09 LAB — CBC
HEMOGLOBIN: 10.7 g/dL — AB (ref 11.1–15.9)
Hematocrit: 32.6 % — ABNORMAL LOW (ref 34.0–46.6)
MCH: 26.8 pg (ref 26.6–33.0)
MCHC: 32.8 g/dL (ref 31.5–35.7)
MCV: 82 fL (ref 79–97)
PLATELETS: 204 10*3/uL (ref 150–379)
RBC: 4 x10E6/uL (ref 3.77–5.28)
RDW: 15.1 % (ref 12.3–15.4)
WBC: 6.5 10*3/uL (ref 3.4–10.8)

## 2016-03-09 LAB — ANTIBODY SCREEN: ANTIBODY SCREEN: NEGATIVE

## 2016-03-09 LAB — HIV ANTIBODY (ROUTINE TESTING W REFLEX): HIV Screen 4th Generation wRfx: NONREACTIVE

## 2016-03-09 LAB — GLUCOSE TOLERANCE, 2 HOURS W/ 1HR
GLUCOSE, 1 HOUR: 128 mg/dL (ref 65–179)
GLUCOSE, FASTING: 81 mg/dL (ref 65–91)
Glucose, 2 hour: 57 mg/dL — ABNORMAL LOW (ref 65–152)

## 2016-03-11 ENCOUNTER — Inpatient Hospital Stay (HOSPITAL_COMMUNITY): Payer: Medicaid Other

## 2016-03-11 ENCOUNTER — Inpatient Hospital Stay (HOSPITAL_COMMUNITY)
Admission: AD | Admit: 2016-03-11 | Discharge: 2016-03-11 | Disposition: A | Discharge status: Home / Self Care | Payer: Medicaid Other | Source: Ambulatory Visit | Attending: Obstetrics and Gynecology | Admitting: Obstetrics and Gynecology

## 2016-03-11 ENCOUNTER — Encounter (HOSPITAL_COMMUNITY): Payer: Self-pay | Admitting: *Deleted

## 2016-03-11 DIAGNOSIS — Z9049 Acquired absence of other specified parts of digestive tract: Secondary | ICD-10-CM | POA: Insufficient documentation

## 2016-03-11 DIAGNOSIS — Z833 Family history of diabetes mellitus: Secondary | ICD-10-CM | POA: Diagnosis not present

## 2016-03-11 DIAGNOSIS — O368120 Decreased fetal movements, second trimester, not applicable or unspecified: Secondary | ICD-10-CM | POA: Diagnosis present

## 2016-03-11 DIAGNOSIS — Z3A28 28 weeks gestation of pregnancy: Secondary | ICD-10-CM | POA: Insufficient documentation

## 2016-03-11 DIAGNOSIS — Z9889 Other specified postprocedural states: Secondary | ICD-10-CM | POA: Insufficient documentation

## 2016-03-11 DIAGNOSIS — Z87891 Personal history of nicotine dependence: Secondary | ICD-10-CM | POA: Insufficient documentation

## 2016-03-11 DIAGNOSIS — Z803 Family history of malignant neoplasm of breast: Secondary | ICD-10-CM | POA: Diagnosis not present

## 2016-03-11 DIAGNOSIS — O368130 Decreased fetal movements, third trimester, not applicable or unspecified: Secondary | ICD-10-CM | POA: Diagnosis not present

## 2016-03-11 DIAGNOSIS — O368390 Maternal care for abnormalities of the fetal heart rate or rhythm, unspecified trimester, not applicable or unspecified: Secondary | ICD-10-CM

## 2016-03-11 NOTE — L&D Delivery Note (Signed)
Vaginal Delivery Note  26 y.o. [id number] at 4848w4d delivered a viable female infant at 1146 in cephalic, LOA position. No nuchal cord. Right anterior shoulder delivered with ease. Sixty sec delayed cord clamping. Cord clamped x2 and cut. Placenta delivered spontaneously intact, with 3VC. Fundus firm on exam with massage and pitocin.  Mother: Anesthesia: Epidural Laceration: None Suture repair: N/A EBL: 100 mL  Baby: Apgars: 8, 9 Weight: Pending Cord pH: Not sent  Good hemostasis noted. Mom to postpartum.  Baby to Couplet care / Skin to Skin.  Kristina Beaversavid J McMullen, DO, PGY-1 05/30/2016, 11:59 AM  OB FELLOW DELIVERY ATTESTATION  I was gloved and present for the delivery in its entirety, and I agree with the above resident's note.    Kristina PennaNicholas Aamiyah Derrick, MD 1:49 PM

## 2016-03-11 NOTE — Discharge Instructions (Signed)
Introduction Patient Name: ________________________________________________ Patient Due Date: ____________________ What is a fetal movement count? A fetal movement count is the number of times that you feel your baby move during a certain amount of time. This may also be called a fetal kick count. A fetal movement count is recommended for every pregnant woman. You may be asked to start counting fetal movements as early as week 28 of your pregnancy. Pay attention to when your baby is most active. You may notice your baby's sleep and wake cycles. You may also notice things that make your baby move more. You should do a fetal movement count:  When your baby is normally most active.  At the same time each day. A good time to count movements is while you are resting, after having something to eat and drink. How do I count fetal movements? 1. Find a quiet, comfortable area. Sit, or lie down on your side. 2. Write down the date, the start time and stop time, and the number of movements that you felt between those two times. Take this information with you to your health care visits. 3. For 2 hours, count kicks, flutters, swishes, rolls, and jabs. You should feel at least 10 movements during 2 hours. 4. You may stop counting after you have felt 10 movements. 5. If you do not feel 10 movements in 2 hours, have something to eat and drink. Then, keep resting and counting for 1 hour. If you feel at least 4 movements during that hour, you may stop counting. Contact a health care provider if:  You feel fewer than 4 movements in 2 hours.  Your baby is not moving like he or she usually does. Date: ____________ Start time: ____________ Stop time: ____________ Movements: ____________ Date: ____________ Start time: ____________ Stop time: ____________ Movements: ____________ Date: ____________ Start time: ____________ Stop time: ____________ Movements: ____________ Date: ____________ Start time: ____________  Stop time: ____________ Movements: ____________ Date: ____________ Start time: ____________ Stop time: ____________ Movements: ____________ Date: ____________ Start time: ____________ Stop time: ____________ Movements: ____________ Date: ____________ Start time: ____________ Stop time: ____________ Movements: ____________ Date: ____________ Start time: ____________ Stop time: ____________ Movements: ____________ Date: ____________ Start time: ____________ Stop time: ____________ Movements: ____________ This information is not intended to replace advice given to you by your health care provider. Make sure you discuss any questions you have with your health care provider. Document Released: 03/27/2006 Document Revised: 10/25/2015 Document Reviewed: 04/06/2015 Elsevier Interactive Patient Education  2017 Elsevier Inc.  

## 2016-03-11 NOTE — MAU Note (Signed)
Last felt movement around 1030.  Did fetal kick counts and only felt 2 movements.  Denies pain, bleeding, or discharge.

## 2016-03-11 NOTE — MAU Provider Note (Signed)
History   [id number] @ 28.1 wks in with decreased fetal movement since 1030 today. Denies ROM or vag bleeding.   CSN: [id number]  Arrival date & time 03/11/16  1608   None     Chief Complaint  Patient presents with  . Decreased Fetal Movement    HPI  Past Medical History:  Diagnosis Date  . Anemia   . Herpes   . Miscarriage   . Rh negative state in antepartum period   . Vaginal Pap smear, abnormal     Past Surgical History:  Procedure Laterality Date  . CHOLECYSTECTOMY N/A 01/04/2015   Procedure: LAPAROSCOPIC CHOLECYSTECTOMY;  Surgeon: Franky MachoMark Jenkins, MD;  Location: [facility];  Service: General;  Laterality: N/A;  . TONSILLECTOMY      Family History  Problem Relation Age of Onset  . Cancer Maternal Grandmother     breast  . Diabetes Paternal Grandfather   . Jaundice Son     Social History  Substance Use Topics  . Smoking status: Former Smoker    Packs/day: 0.50    Years: 9.00    Types: Cigarettes    Quit date: 01/01/2013  . Smokeless tobacco: Never Used  . Alcohol use No     Comment: occasional    OB History    Gravida Para Term Preterm AB Living   4 2 2   1 2    SAB TAB Ectopic Multiple Live Births   1     0 2      Review of Systems  Constitutional: Negative.   HENT: Negative.   Eyes: Negative.   Respiratory: Negative.   Cardiovascular: Negative.   Gastrointestinal: Negative.   Endocrine: Negative.   Genitourinary: Negative.   Musculoskeletal: Negative.   Skin: Negative.   Allergic/Immunologic: Negative.   Neurological: Negative.   Hematological: Negative.   Psychiatric/Behavioral: Negative.     Allergies  Patient has no known allergies.  Home Medications    BP 133/68 (BP Location: Right Arm)   Pulse 92   Resp 18   LMP 08/27/2015 (Approximate)   SpO2 100%   Physical Exam  Constitutional: She is oriented to person, place, and time. She appears well-developed and well-nourished.  HENT:  Head: Normocephalic.  Eyes: Pupils are equal,  round, and reactive to light.  Neck: Normal range of motion.  Cardiovascular: Normal rate, regular rhythm, normal heart sounds and intact distal pulses.   Pulmonary/Chest: Effort normal and breath sounds normal.  Abdominal: Soft. Bowel sounds are normal.  Musculoskeletal: Normal range of motion.  Neurological: She is alert and oriented to person, place, and time. She has normal reflexes.  Skin: Skin is warm and dry.  Psychiatric: She has a normal mood and affect. Her behavior is normal. Judgment and thought content normal.    MAU Course  Procedures (including critical care time)  Labs Reviewed - No data to display No results found.   1. Irregular fetal heart rate       MDM  FHR stable but will get BPP to assess fetal well being. BPP 8/8. Feeling movement now. Will d/c home

## 2016-03-12 ENCOUNTER — Encounter: Payer: Self-pay | Admitting: Advanced Practice Midwife

## 2016-03-14 ENCOUNTER — Encounter: Payer: Self-pay | Admitting: Women's Health

## 2016-04-05 ENCOUNTER — Encounter: Payer: Self-pay | Admitting: Women's Health

## 2016-04-05 ENCOUNTER — Ambulatory Visit (INDEPENDENT_AMBULATORY_CARE_PROVIDER_SITE_OTHER): Payer: Medicaid Other | Admitting: Women's Health

## 2016-04-05 VITALS — BP 100/60 | HR 76 | Wt 233.0 lb

## 2016-04-05 DIAGNOSIS — L292 Pruritus vulvae: Secondary | ICD-10-CM

## 2016-04-05 DIAGNOSIS — O360131 Maternal care for anti-D [Rh] antibodies, third trimester, fetus 1: Secondary | ICD-10-CM

## 2016-04-05 DIAGNOSIS — Z331 Pregnant state, incidental: Secondary | ICD-10-CM | POA: Diagnosis not present

## 2016-04-05 DIAGNOSIS — Z3483 Encounter for supervision of other normal pregnancy, third trimester: Secondary | ICD-10-CM

## 2016-04-05 DIAGNOSIS — Z3A32 32 weeks gestation of pregnancy: Secondary | ICD-10-CM

## 2016-04-05 DIAGNOSIS — O26899 Other specified pregnancy related conditions, unspecified trimester: Secondary | ICD-10-CM

## 2016-04-05 DIAGNOSIS — Z6791 Unspecified blood type, Rh negative: Secondary | ICD-10-CM

## 2016-04-05 DIAGNOSIS — Z1389 Encounter for screening for other disorder: Secondary | ICD-10-CM

## 2016-04-05 DIAGNOSIS — O99013 Anemia complicating pregnancy, third trimester: Secondary | ICD-10-CM

## 2016-04-05 LAB — POCT WET PREP (WET MOUNT)
CLUE CELLS WET PREP WHIFF POC: NEGATIVE
Trichomonas Wet Prep HPF POC: ABSENT

## 2016-04-05 LAB — POCT URINALYSIS DIPSTICK
Blood, UA: NEGATIVE
GLUCOSE UA: NEGATIVE
KETONES UA: NEGATIVE
Nitrite, UA: NEGATIVE

## 2016-04-05 MED ORDER — RHO D IMMUNE GLOBULIN 1500 UNIT/2ML IJ SOSY
300.0000 ug | PREFILLED_SYRINGE | Freq: Once | INTRAMUSCULAR | Status: AC
  Administered 2016-04-05: 300 ug via INTRAMUSCULAR

## 2016-04-05 MED ORDER — FERROUS SULFATE 325 (65 FE) MG PO TABS: 325.0000 mg | ORAL_TABLET | Freq: Two times a day (BID) | ORAL | 3 refills | Status: DC

## 2016-04-05 NOTE — Patient Instructions (Addendum)
Over the counter Monistat 7, no sex while taking  Call the office [phone]) or go to [facility] if:  You begin to have strong, frequent contractions  Your water breaks.  Sometimes it is a big gush of fluid, sometimes it is just a trickle that keeps getting your panties wet or running down your legs  You have vaginal bleeding.  It is normal to have a small amount of spotting if your cervix was checked.   You don't feel your baby moving like normal.  If you don't, get you something to eat and drink and lay down and focus on feeling your baby move.  You should feel at least 10 movements in 2 hours.  If you don't, you should call the office or go to [facility].     Preterm Labor and Birth Information The normal length of a pregnancy is 39-41 weeks. Preterm labor is when labor starts before 37 completed weeks of pregnancy. What are the risk factors for preterm labor? Preterm labor is more likely to occur in women who:  Have certain infections during pregnancy such as a bladder infection, sexually transmitted infection, or infection inside the uterus (chorioamnionitis).  Have a shorter-than-normal cervix.  Have gone into preterm labor before.  Have had surgery on their cervix.  Are younger than age 28 or older than age 28.  Are African American.  Are pregnant with twins or multiple babies (multiple gestation).  Take street drugs or smoke while pregnant.  Do not gain enough weight while pregnant.  Became pregnant shortly after having been pregnant. What are the symptoms of preterm labor? Symptoms of preterm labor include:  Cramps similar to those that can happen during a menstrual period. The cramps may happen with diarrhea.  Pain in the abdomen or lower back.  Regular uterine contractions that may feel like tightening of the abdomen.  A feeling of increased pressure in the pelvis.  Increased watery or bloody mucus discharge from the vagina.  Water breaking  (ruptured amniotic sac). Why is it important to recognize signs of preterm labor? It is important to recognize signs of preterm labor because babies who are born prematurely may not be fully developed. This can put them at an increased risk for:  Long-term (chronic) heart and lung problems.  Difficulty immediately after birth with regulating body systems, including blood sugar, body temperature, heart rate, and breathing rate.  Bleeding in the brain.  Cerebral palsy.  Learning difficulties.  Death. These risks are highest for babies who are born before 34 weeks of pregnancy. How is preterm labor treated? Treatment depends on the length of your pregnancy, your condition, and the health of your baby. It may involve:  Having a stitch (suture) placed in your cervix to prevent your cervix from opening too early (cerclage).  Taking or being given medicines, such as:  Hormone medicines. These may be given early in pregnancy to help support the pregnancy.  Medicine to stop contractions.  Medicines to help mature the baby's lungs. These may be prescribed if the risk of delivery is high.  Medicines to prevent your baby from developing cerebral palsy. If the labor happens before 34 weeks of pregnancy, you may need to stay in the hospital. What should I do if I think I am in preterm labor? If you think that you are going into preterm labor, call your health care provider right away. How can I prevent preterm labor in future pregnancies? To increase your chance of having a  full-term pregnancy:  Do not use any tobacco products, such as cigarettes, chewing tobacco, and e-cigarettes. If you need help quitting, ask your health care provider.  Do not use street drugs or medicines that have not been prescribed to you during your pregnancy.  Talk with your health care provider before taking any herbal supplements, even if you have been taking them regularly.  Make sure you gain a healthy amount  of weight during your pregnancy.  Watch for infection. If you think that you might have an infection, get it checked right away.  Make sure to tell your health care provider if you have gone into preterm labor before. This information is not intended to replace advice given to you by your health care provider. Make sure you discuss any questions you have with your health care provider. Document Released: 05/18/2003 Document Revised: 08/08/2015 Document Reviewed: 07/19/2015 Elsevier Interactive Patient Education  2017 [employer].

## 2016-04-05 NOTE — Progress Notes (Signed)
Low-risk OB appointment Z6X0960G4P2012 663w5d Estimated Date of Delivery: 06/02/16 BP 100/60   Pulse 76   Wt 233 lb (105.7 kg)   LMP 08/27/2015 (Approximate)   BMI 36.49 kg/m   BP, weight, and urine reviewed.  Refer to obstetrical flow sheet for FH & FHR.  Reports good fm.  Denies regular uc's, lof, vb, or uti s/s. Intermittent vulvar itching Spec exam: thick clumpy yellow nonodorous d/c c/w yeast Wet prep + yeast, to use otc monistat 7  Reviewed pn2 results, is anemic- rx fe bid, increase fe rich foods. Discussed ptl s/s, fkc. [facility]- states they wanted written rx Plan:  Continue routine obstetrical care  F/U in 2wks for OB appointment  Rhogam today

## 2016-04-05 NOTE — Addendum Note (Signed)
Addended by: Federico FlakeNES, PEGGY A on: 04/05/2016 09:57 AM   Modules accepted: Orders

## 2016-04-19 ENCOUNTER — Ambulatory Visit (INDEPENDENT_AMBULATORY_CARE_PROVIDER_SITE_OTHER): Payer: Medicaid Other | Admitting: Women's Health

## 2016-04-19 ENCOUNTER — Encounter: Payer: Self-pay | Admitting: Women's Health

## 2016-04-19 VITALS — BP 126/66 | HR 92 | Wt 234.0 lb

## 2016-04-19 DIAGNOSIS — Z1389 Encounter for screening for other disorder: Secondary | ICD-10-CM

## 2016-04-19 DIAGNOSIS — Z331 Pregnant state, incidental: Secondary | ICD-10-CM

## 2016-04-19 DIAGNOSIS — O26899 Other specified pregnancy related conditions, unspecified trimester: Secondary | ICD-10-CM

## 2016-04-19 DIAGNOSIS — Z3A34 34 weeks gestation of pregnancy: Secondary | ICD-10-CM

## 2016-04-19 DIAGNOSIS — Z6791 Unspecified blood type, Rh negative: Secondary | ICD-10-CM

## 2016-04-19 DIAGNOSIS — Z3483 Encounter for supervision of other normal pregnancy, third trimester: Secondary | ICD-10-CM

## 2016-04-19 LAB — POCT URINALYSIS DIPSTICK
Glucose, UA: NEGATIVE
Ketones, UA: NEGATIVE
NITRITE UA: NEGATIVE

## 2016-04-19 NOTE — Progress Notes (Signed)
Low-risk OB appointment [id number] 6068w5d Estimated Date of Delivery: 06/02/16 BP 126/66   Pulse 92   Wt 234 lb (106.1 kg)   LMP 08/27/2015 (Approximate)   BMI 36.65 kg/m   BP, weight, and urine reviewed.  Refer to obstetrical flow sheet for FH & FHR.  Reports good fm.  Denies regular uc's, lof, vb, or uti s/s. Symphysis pubis pain, pregnancy belt not helping, recommended support underwear, can try chiropractor Reviewed ptl s/s, fkc. Plan:  Continue routine obstetrical care  F/U in 2wks for OB appointment

## 2016-04-19 NOTE — Patient Instructions (Signed)
Call the office ([phone]) or go to [facility] if:  You begin to have strong, frequent contractions  Your water breaks.  Sometimes it is a big gush of fluid, sometimes it is just a trickle that keeps getting your panties wet or running down your legs  You have vaginal bleeding.  It is normal to have a small amount of spotting if your cervix was checked.   You don't feel your baby moving like normal.  If you don't, get you something to eat and drink and lay down and focus on feeling your baby move.  You should feel at least 10 movements in 2 hours.  If you don't, you should call the office or go to [facility].     Preterm Labor and Birth Information The normal length of a pregnancy is 39-41 weeks. Preterm labor is when labor starts before 37 completed weeks of pregnancy. What are the risk factors for preterm labor? Preterm labor is more likely to occur in women who:  Have certain infections during pregnancy such as a bladder infection, sexually transmitted infection, or infection inside the uterus (chorioamnionitis).  Have a shorter-than-normal cervix.  Have gone into preterm labor before.  Have had surgery on their cervix.  Are younger than age 17 or older than age 35.  Are African American.  Are pregnant with twins or multiple babies (multiple gestation).  Take street drugs or smoke while pregnant.  Do not gain enough weight while pregnant.  Became pregnant shortly after having been pregnant. What are the symptoms of preterm labor? Symptoms of preterm labor include:  Cramps similar to those that can happen during a menstrual period. The cramps may happen with diarrhea.  Pain in the abdomen or lower back.  Regular uterine contractions that may feel like tightening of the abdomen.  A feeling of increased pressure in the pelvis.  Increased watery or bloody mucus discharge from the vagina.  Water breaking (ruptured amniotic sac). Why is it important to  recognize signs of preterm labor? It is important to recognize signs of preterm labor because babies who are born prematurely may not be fully developed. This can put them at an increased risk for:  Long-term (chronic) heart and lung problems.  Difficulty immediately after birth with regulating body systems, including blood sugar, body temperature, heart rate, and breathing rate.  Bleeding in the brain.  Cerebral palsy.  Learning difficulties.  Death. These risks are highest for babies who are born before 34 weeks of pregnancy. How is preterm labor treated? Treatment depends on the length of your pregnancy, your condition, and the health of your baby. It may involve:  Having a stitch (suture) placed in your cervix to prevent your cervix from opening too early (cerclage).  Taking or being given medicines, such as:  Hormone medicines. These may be given early in pregnancy to help support the pregnancy.  Medicine to stop contractions.  Medicines to help mature the baby's lungs. These may be prescribed if the risk of delivery is high.  Medicines to prevent your baby from developing cerebral palsy. If the labor happens before 34 weeks of pregnancy, you may need to stay in the hospital. What should I do if I think I am in preterm labor? If you think that you are going into preterm labor, call your health care provider right away. How can I prevent preterm labor in future pregnancies? To increase your chance of having a full-term pregnancy:  Do not use any tobacco products, such   as cigarettes, chewing tobacco, and e-cigarettes. If you need help quitting, ask your health care provider.  Do not use street drugs or medicines that have not been prescribed to you during your pregnancy.  Talk with your health care provider before taking any herbal supplements, even if you have been taking them regularly.  Make sure you gain a healthy amount of weight during your pregnancy.  Watch for  infection. If you think that you might have an infection, get it checked right away.  Make sure to tell your health care provider if you have gone into preterm labor before. This information is not intended to replace advice given to you by your health care provider. Make sure you discuss any questions you have with your health care provider. Document Released: 05/18/2003 Document Revised: 08/08/2015 Document Reviewed: 07/19/2015 Elsevier Interactive Patient Education  2017 Elsevier Inc.  

## 2016-05-01 ENCOUNTER — Inpatient Hospital Stay (HOSPITAL_COMMUNITY)
Admission: AD | Admit: 2016-05-01 | Discharge: 2016-05-01 | Disposition: A | Discharge status: Home / Self Care | Payer: Medicaid Other | Source: Ambulatory Visit | Attending: Obstetrics & Gynecology | Admitting: Obstetrics & Gynecology

## 2016-05-01 ENCOUNTER — Encounter (HOSPITAL_COMMUNITY): Payer: Self-pay

## 2016-05-01 DIAGNOSIS — R197 Diarrhea, unspecified: Secondary | ICD-10-CM | POA: Diagnosis present

## 2016-05-01 DIAGNOSIS — A084 Viral intestinal infection, unspecified: Secondary | ICD-10-CM | POA: Insufficient documentation

## 2016-05-01 DIAGNOSIS — Z3483 Encounter for supervision of other normal pregnancy, third trimester: Secondary | ICD-10-CM

## 2016-05-01 DIAGNOSIS — O368390 Maternal care for abnormalities of the fetal heart rate or rhythm, unspecified trimester, not applicable or unspecified: Secondary | ICD-10-CM

## 2016-05-01 DIAGNOSIS — Z3A35 35 weeks gestation of pregnancy: Secondary | ICD-10-CM | POA: Insufficient documentation

## 2016-05-01 DIAGNOSIS — Z87891 Personal history of nicotine dependence: Secondary | ICD-10-CM | POA: Diagnosis not present

## 2016-05-01 DIAGNOSIS — O99613 Diseases of the digestive system complicating pregnancy, third trimester: Secondary | ICD-10-CM | POA: Insufficient documentation

## 2016-05-01 DIAGNOSIS — O219 Vomiting of pregnancy, unspecified: Secondary | ICD-10-CM

## 2016-05-01 DIAGNOSIS — O9989 Other specified diseases and conditions complicating pregnancy, childbirth and the puerperium: Secondary | ICD-10-CM | POA: Diagnosis not present

## 2016-05-01 LAB — URINALYSIS, ROUTINE W REFLEX MICROSCOPIC
GLUCOSE, UA: NEGATIVE mg/dL
Ketones, ur: >80 mg/dL — AB
Leukocytes, UA: NEGATIVE
Nitrite: NEGATIVE
Protein, ur: NEGATIVE mg/dL
SPECIFIC GRAVITY, URINE: 1.025 (ref 1.005–1.030)
pH: 6 (ref 5.0–8.0)

## 2016-05-01 LAB — URINALYSIS, MICROSCOPIC (REFLEX)

## 2016-05-01 MED ORDER — ONDANSETRON HCL 4 MG/2ML IJ SOLN
4.0000 mg | Freq: Once | INTRAMUSCULAR | Status: AC
  Administered 2016-05-01: 4 mg via INTRAVENOUS
  Filled 2016-05-01: qty 2

## 2016-05-01 MED ORDER — FAMOTIDINE IN NACL 20-0.9 MG/50ML-% IV SOLN
20.0000 mg | Freq: Once | INTRAVENOUS | Status: AC
  Administered 2016-05-01: 20 mg via INTRAVENOUS
  Filled 2016-05-01: qty 50

## 2016-05-01 MED ORDER — LACTATED RINGERS IV BOLUS (SEPSIS)
1000.0000 mL | Freq: Once | INTRAVENOUS | Status: AC
  Administered 2016-05-01: 1000 mL via INTRAVENOUS

## 2016-05-01 MED ORDER — ONDANSETRON HCL 4 MG PO TABS: 4.0000 mg | ORAL_TABLET | Freq: Four times a day (QID) | ORAL | 0 refills | Status: DC

## 2016-05-01 MED ORDER — PROMETHAZINE HCL 25 MG/ML IJ SOLN
12.5000 mg | Freq: Once | INTRAMUSCULAR | Status: AC
  Administered 2016-05-01: 12.5 mg via INTRAVENOUS
  Filled 2016-05-01: qty 1

## 2016-05-01 NOTE — Progress Notes (Signed)
Pt was able to tolerate PO fluids

## 2016-05-01 NOTE — MAU Note (Signed)
Got sick around 0100, vomiting and diarrhea. Has continued.  Stomach had been hurting really bad off and on

## 2016-05-01 NOTE — MAU Note (Signed)
Urine in lab 

## 2016-05-01 NOTE — Progress Notes (Signed)
Pt given ginger ale, was feeling less nauseous.

## 2016-05-01 NOTE — MAU Provider Note (Signed)
History   [id number]   Chief Complaint  Patient presents with  . Emesis  . Diarrhea    HPI Kristina [name] is a 26 y.o. female  [phone] at [redacted]w[redacted]d IUP here with report of vomiting and diarrhea that started at 0100.  Denies fever.  Reports vomiting 7x in past 24 hours with 12 loose stools.  Generalized abdominal cramping that's intermittent in nature.  Reports multiple people in home have had similar illness.  Denies vaginal bleeding, contractions, or leaking of fluid.    Patient's last menstrual period was 08/27/2015 (approximate).  OB History  Gravida Para Term Preterm AB Living  4 2 2   1 2   SAB TAB Ectopic Multiple Live Births  1     0 2    # Outcome Date GA Lbr Len/2nd Weight Sex Delivery Anes PTL Lv  4 Current           3 Term 06/10/14 [redacted]w[redacted]d 06:40 / 00:19 8 lb 1.5 oz (3.67 kg) M Vag-Spont EPI N LIV  2 SAB 05/08/13 [redacted]w[redacted]d  3 oz (0.085 kg)   None       Birth Comments: Delivered at home at [redacted]w[redacted]d, but fetus had been deceased for a few weeks/appeared to be 16-17wk size  1 Term 12/09/07 [redacted]w[redacted]d  7 lb 10 oz (3.459 kg) F Vag-Spont EPI N LIV      Past Medical History:  Diagnosis Date  . Anemia   . Herpes   . Miscarriage   . Rh negative state in antepartum period   . Vaginal Pap smear, abnormal     Family History  Problem Relation Age of Onset  . Cancer Maternal Grandmother     breast  . Diabetes Paternal Grandfather   . Jaundice Son     Social History   Social History  . Marital status: Married    Spouse name: N/A  . Number of children: N/A  . Years of education: N/A   Social History Main Topics  . Smoking status: Former Smoker    Packs/day: 0.50    Years: 9.00    Types: Cigarettes    Quit date: 01/01/2013  . Smokeless tobacco: Never Used  . Alcohol use No     Comment: occasional  . Drug use: No  . Sexual activity: Yes    Birth control/ protection: None   Other Topics Concern  . None   Social History Narrative  . None    No Known Allergies  No  current facility-administered medications on file prior to encounter.    Current Outpatient Prescriptions on File Prior to Encounter  Medication Sig Dispense Refill  . acetaminophen (TYLENOL) 325 MG tablet Take 650 mg by mouth every 6 (six) hours as needed for mild pain or headache.     Marland Kitchen acyclovir (ZOVIRAX) 400 MG tablet Take 400 mg by mouth 3 (three) times daily.    . ferrous sulfate 325 (65 FE) MG tablet Take 1 tablet (325 mg total) by mouth 2 (two) times daily with a meal. 60 tablet 3  . Prenatal Vit-Fe Fumarate-FA (PRENATAL VITAMIN PO) Take 1 tablet by mouth at bedtime.        Review of Systems  Constitutional: Negative for fever.  Gastrointestinal: Positive for abdominal pain, diarrhea, nausea and vomiting. Negative for constipation.  Genitourinary: Negative for dysuria, frequency, pelvic pain, vaginal bleeding and vaginal discharge.  Neurological: Positive for dizziness. Negative for syncope.     Physical Exam   Vitals:   05/01/16 1123  BP: 112/58  Pulse: (!) 130  Resp: 18  Temp: 98.1 F (36.7 C)  TempSrc: Oral  SpO2: 100%  Weight: 231 lb 8 oz (105 kg)    Physical Exam  Constitutional: She is oriented to person, place, and time. She appears well-developed and well-nourished. No distress.  HENT:  Head: Normocephalic.  Neck: Normal range of motion. Neck supple.  Cardiovascular: Regular rhythm and normal heart sounds.   Tachycardia  Respiratory: Effort normal and breath sounds normal. No respiratory distress.  GI: Soft. There is no rebound and no guarding.  Hyperactive bowel sounds  Musculoskeletal: Normal range of motion. She exhibits no edema.  Neurological: She is alert and oriented to person, place, and time. She has normal reflexes.  Skin: Skin is warm and dry.    MAU Course  Procedures Results for orders placed or performed during the hospital encounter of 05/01/16 (from the past 24 hour(s))  Urinalysis, Routine w reflex microscopic     Status: Abnormal    Collection Time: 05/01/16 11:25 AM  Result Value Ref Range   Color, Urine YELLOW YELLOW   APPearance CLEAR CLEAR   Specific Gravity, Urine 1.025 1.005 - 1.030   pH 6.0 5.0 - 8.0   Glucose, UA NEGATIVE NEGATIVE mg/dL   Hgb urine dipstick SMALL (A) NEGATIVE   Bilirubin Urine SMALL (A) NEGATIVE   Ketones, ur >80 (A) NEGATIVE mg/dL   Protein, ur NEGATIVE NEGATIVE mg/dL   Nitrite NEGATIVE NEGATIVE   Leukocytes, UA NEGATIVE NEGATIVE  Urinalysis, Microscopic (reflex)     Status: Abnormal   Collection Time: 05/01/16 11:25 AM  Result Value Ref Range   RBC / HPF 0-5 0 - 5 RBC/hpf   WBC, UA 0-5 0 - 5 WBC/hpf   Bacteria, UA RARE (A) NONE SEEN   Squamous Epithelial / LPF 0-5 (A) NONE SEEN   1250 Report given to J. Magnus SinningWenzel who assumes care of patient Marlis EdelsonWalidah N Karim, [place]  [id number] - Care assumed from [place], [place]. Patient reports improvement in symptoms. Able to tolerate PO while in MAU.   Fetal Monitoring: Baseline: 160 bpm initially, after IV fluids 140 bpm Variability: moderate Accelerations: 15 x 15 Decelerations: none Contractions: none  Assessment and Plan  A: SIUP at 1851w3d Viral gastroenteritis  P:  Discharge home Rx for Zofran given to patient  BRAT diet included on AVS Preterm labor precautions discussed Patient [facility]  Patient may return to MAU as needed or if her condition were to change or worsen  Marny LowensteinJulie N Zilla Shartzer, PA-C 05/01/2016 1:35 PM

## 2016-05-01 NOTE — Discharge Instructions (Signed)
Food Choices to Help Relieve Diarrhea, Adult When you have diarrhea, the foods you eat and your eating habits are very important. Choosing the right foods and drinks can help relieve diarrhea. Also, because diarrhea can last up to 7 days, you need to replace lost fluids and electrolytes (such as sodium, potassium, and chloride) in order to help prevent dehydration. What general guidelines do I need to follow?  Slowly drink 1 cup (8 oz) of fluid for each episode of diarrhea. If you are getting enough fluid, your urine will be clear or pale yellow.  Eat starchy foods. Some good choices include white rice, white toast, pasta, low-fiber cereal, baked potatoes (without the skin), saltine crackers, and bagels.  Avoid large servings of any cooked vegetables.  Limit fruit to two servings per day. A serving is  cup or 1 small piece.  Choose foods with less than 2 g of fiber per serving.  Limit fats to less than 8 tsp (38 g) per day.  Avoid fried foods.  Eat foods that have probiotics in them. Probiotics can be found in certain dairy products.  Avoid foods and beverages that may increase the speed at which food moves through the stomach and intestines (gastrointestinal tract). Things to avoid include:  High-fiber foods, such as dried fruit, raw fruits and vegetables, nuts, seeds, and whole grain foods.  Spicy foods and high-fat foods.  Foods and beverages sweetened with high-fructose corn syrup, honey, or sugar alcohols such as xylitol, sorbitol, and mannitol. What foods are recommended? Grains  White rice. White, French, or pita breads (fresh or toasted), including plain rolls, buns, or bagels. White pasta. Saltine, soda, or graham crackers. Pretzels. Low-fiber cereal. Cooked cereals made with water (such as cornmeal, farina, or cream cereals). Plain muffins. Matzo. Melba toast. Zwieback. Vegetables  Potatoes (without the skin). Strained tomato and vegetable juices. Most well-cooked and canned  vegetables without seeds. Tender lettuce. Fruits  Cooked or canned applesauce, apricots, cherries, fruit cocktail, grapefruit, peaches, pears, or plums. Fresh bananas, apples without skin, cherries, grapes, cantaloupe, grapefruit, peaches, oranges, or plums. Meat and Other Protein Products  Baked or boiled chicken. Eggs. Tofu. Fish. Seafood. Smooth peanut butter. Ground or well-cooked tender beef, ham, veal, lamb, pork, or poultry. Dairy  Plain yogurt, kefir, and unsweetened liquid yogurt. Lactose-free milk, buttermilk, or soy milk. Plain hard cheese. Beverages  Sport drinks. Clear broths. Diluted fruit juices (except prune). Regular, caffeine-free sodas such as ginger ale. Water. Decaffeinated teas. Oral rehydration solutions. Sugar-free beverages not sweetened with sugar alcohols. Other  Bouillon, broth, or soups made from recommended foods. The items listed above may not be a complete list of recommended foods or beverages. Contact your dietitian for more options.  What foods are not recommended? Grains  Whole grain, whole wheat, bran, or rye breads, rolls, pastas, crackers, and cereals. Wild or brown rice. Cereals that contain more than 2 g of fiber per serving. Corn tortillas or taco shells. Cooked or dry oatmeal. Granola. Popcorn. Vegetables  Raw vegetables. Cabbage, broccoli, Brussels sprouts, artichokes, baked beans, beet greens, corn, kale, legumes, peas, sweet potatoes, and yams. Potato skins. Cooked spinach and cabbage. Fruits  Dried fruit, including raisins and dates. Raw fruits. Stewed or dried prunes. Fresh apples with skin, apricots, mangoes, pears, raspberries, and strawberries. Meat and Other Protein Products  Chunky peanut butter. Nuts and seeds. Beans and lentils. Bacon. Dairy  High-fat cheeses. Milk, chocolate milk, and beverages made with milk, such as milk shakes. Cream. Ice cream. Sweets and Desserts    Sweet rolls, doughnuts, and sweet breads. Pancakes and waffles. Fats  and Oils  Butter. Cream sauces. Margarine. Salad oils. Plain salad dressings. Olives. Avocados. Beverages  Caffeinated beverages (such as coffee, tea, soda, or energy drinks). Alcoholic beverages. Fruit juices with pulp. Prune juice. Soft drinks sweetened with high-fructose corn syrup or sugar alcohols. Other  Coconut. Hot sauce. Chili powder. Mayonnaise. Gravy. Cream-based or milk-based soups. The items listed above may not be a complete list of foods and beverages to avoid. Contact your dietitian for more information.  What should I do if I become dehydrated? Diarrhea can sometimes lead to dehydration. Signs of dehydration include dark urine and dry mouth and skin. If you think you are dehydrated, you should rehydrate with an oral rehydration solution. These solutions can be purchased at pharmacies, retail stores, or online. Drink -1 cup (120-240 mL) of oral rehydration solution each time you have an episode of diarrhea. If drinking this amount makes your diarrhea worse, try drinking smaller amounts more often. For example, drink 1-3 tsp (5-15 mL) every 5-10 minutes. A general rule for staying hydrated is to drink 1-2 L of fluid per day. Talk to your health care provider about the specific amount you should be drinking each day. Drink enough fluids to keep your urine clear or pale yellow. This information is not intended to replace advice given to you by your health care provider. Make sure you discuss any questions you have with your health care provider. Document Released: 05/18/2003 Document Revised: 08/03/2015 Document Reviewed: 01/18/2013 Elsevier Interactive Patient Education  2017 Elsevier Inc.   Viral Gastroenteritis, Adult Introduction Viral gastroenteritis is also known as the stomach flu. This condition is caused by certain germs (viruses). These germs can be passed from person to person very easily (are very contagious). This condition can cause sudden watery poop (diarrhea),  fever, and throwing up (vomiting). Having watery poop and throwing up can make you feel weak and cause you to get dehydrated. Dehydration can make you tired and thirsty, make you have a dry mouth, and make it so you pee (urinate) less often. Older adults and people with other diseases or a weak defense system (immune system) are at higher risk for dehydration. It is important to replace the fluids that you lose from having watery poop and throwing up. Follow these instructions at home: Follow instructions from your doctor about how to care for yourself at home. Eating and drinking Follow these instructions as told by your doctor:  Take an oral rehydration solution (ORS). This is a drink that is sold at pharmacies and stores.  Drink clear fluids in small amounts as you are able, such as:  Water.  Ice chips.  Diluted fruit juice.  Low-calorie sports drinks.  Eat bland, easy-to-digest foods in small amounts as you are able, such as:  Bananas.  Applesauce.  Rice.  Low-fat (lean) meats.  Toast.  Crackers.  Avoid fluids that have a lot of sugar or caffeine in them.  Avoid alcohol.  Avoid spicy or fatty foods. General instructions  Drink enough fluid to keep your pee (urine) clear or pale yellow.  Wash your hands often. If you cannot use soap and water, use hand sanitizer.  Make sure that all people in your home wash their hands well and often.  Rest at home while you get better.  Take over-the-counter and prescription medicines only as told by your doctor.  Watch your condition for any changes.  Take a warm bath to help with   any burning or pain from having watery poop.  Keep all follow-up visits as told by your doctor. This is important. Contact a doctor if:  You cannot keep fluids down.  Your symptoms get worse.  You have new symptoms.  You feel light-headed or dizzy.  You have muscle cramps. Get help right away if:  You have chest pain.  You feel very  weak or you pass out (faint).  You see blood in your throw-up.  Your throw-up looks like coffee grounds.  You have bloody or black poop (stools) or poop that look like tar.  You have a very bad headache, a stiff neck, or both.  You have a rash.  You have very bad pain, cramping, or bloating in your belly (abdomen).  You have trouble breathing.  You are breathing very quickly.  Your heart is beating very quickly.  Your skin feels cold and clammy.  You feel confused.  You have pain when you pee.  You have signs of dehydration, such as:  Dark pee, hardly any pee, or no pee.  Cracked lips.  Dry mouth.  Sunken eyes.  Sleepiness.  Weakness. This information is not intended to replace advice given to you by your health care provider. Make sure you discuss any questions you have with your health care provider. Document Released: 08/14/2007 Document Revised: 09/15/2015 Document Reviewed: 11/01/2014  2017 Elsevier  

## 2016-05-03 ENCOUNTER — Ambulatory Visit (INDEPENDENT_AMBULATORY_CARE_PROVIDER_SITE_OTHER): Payer: Medicaid Other | Admitting: Women's Health

## 2016-05-03 ENCOUNTER — Encounter: Payer: Self-pay | Admitting: Women's Health

## 2016-05-03 VITALS — BP 104/60 | HR 92 | Wt 236.0 lb

## 2016-05-03 DIAGNOSIS — Z3A36 36 weeks gestation of pregnancy: Secondary | ICD-10-CM

## 2016-05-03 DIAGNOSIS — Z3483 Encounter for supervision of other normal pregnancy, third trimester: Secondary | ICD-10-CM

## 2016-05-03 DIAGNOSIS — Z331 Pregnant state, incidental: Secondary | ICD-10-CM

## 2016-05-03 DIAGNOSIS — Z1389 Encounter for screening for other disorder: Secondary | ICD-10-CM

## 2016-05-03 LAB — POCT URINALYSIS DIPSTICK
GLUCOSE UA: NEGATIVE
KETONES UA: NEGATIVE
Leukocytes, UA: NEGATIVE
Nitrite, UA: NEGATIVE
RBC UA: NEGATIVE

## 2016-05-03 NOTE — Patient Instructions (Addendum)
Call the office ([phone]) or go to [facility] if:  You begin to have strong, frequent contractions  Your water breaks.  Sometimes it is a big gush of fluid, sometimes it is just a trickle that keeps getting your panties wet or running down your legs  You have vaginal bleeding.  It is normal to have a small amount of spotting if your cervix was checked.   You don't feel your baby moving like normal.  If you don't, get you something to eat and drink and lay down and focus on feeling your baby move.  You should feel at least 10 movements in 2 hours.  If you don't, you should call the office or go to [facility].     Braxton Hicks Contractions Contractions of the uterus can occur throughout pregnancy. Contractions are not always a sign that you are in labor.  WHAT ARE BRAXTON HICKS CONTRACTIONS?  Contractions that occur before labor are called Braxton Hicks contractions, or false labor. Toward the end of pregnancy (32-34 weeks), these contractions can develop more often and may become more forceful. This is not true labor because these contractions do not result in opening (dilatation) and thinning of the cervix. They are sometimes difficult to tell apart from true labor because these contractions can be forceful and people have different pain tolerances. You should not feel embarrassed if you go to the hospital with false labor. Sometimes, the only way to tell if you are in true labor is for your health care provider to look for changes in the cervix. If there are no prenatal problems or other health problems associated with the pregnancy, it is completely safe to be sent home with false labor and await the onset of true labor. HOW CAN YOU TELL THE DIFFERENCE BETWEEN TRUE AND FALSE LABOR? False Labor   The contractions of false labor are usually shorter and not as hard as those of true labor.   The contractions are usually irregular.   The contractions are often felt in the front of  the lower abdomen and in the groin.   The contractions may go away when you walk around or change positions while lying down.   The contractions get weaker and are shorter lasting as time goes on.   The contractions do not usually become progressively stronger, regular, and closer together as with true labor.  True Labor   Contractions in true labor last 30-70 seconds, become very regular, usually become more intense, and increase in frequency.   The contractions do not go away with walking.   The discomfort is usually felt in the top of the uterus and spreads to the lower abdomen and low back.   True labor can be determined by your health care provider with an exam. This will show that the cervix is dilating and getting thinner.  WHAT TO REMEMBER  Keep up with your usual exercises and follow other instructions given by your health care provider.   Take medicines as directed by your health care provider.   Keep your regular prenatal appointments.   Eat and drink lightly if you think you are going into labor.   If Braxton Hicks contractions are making you uncomfortable:   Change your position from lying down or resting to walking, or from walking to resting.   Sit and rest in a tub of warm water.   Drink 2-3 glasses of water. Dehydration may cause these contractions.   Do slow and deep breathing several   times an hour.  WHEN SHOULD I SEEK IMMEDIATE MEDICAL CARE? Seek immediate medical care if:  Your contractions become stronger, more regular, and closer together.   You have fluid leaking or gushing from your vagina.   You have a fever.   You pass blood-tinged mucus.   You have vaginal bleeding.   You have continuous abdominal pain.   You have low back pain that you never had before.   You feel your baby's head pushing down and causing pelvic pressure.   Your baby is not moving as much as it used to.  This information is not intended to  replace advice given to you by your health care provider. Make sure you discuss any questions you have with your health care provider. Document Released: 02/25/2005 Document Revised: 06/19/2015 Document Reviewed: 12/07/2012 Elsevier Interactive Patient Education  2017 Elsevier Inc.  

## 2016-05-03 NOTE — Progress Notes (Signed)
Low-risk OB appointment [id number] Estimated Date of Delivery: 06/02/16 BP 104/60   Pulse 92   Wt 236 lb (107 kg)   LMP 08/27/2015 (Approximate)   BMI 36.96 kg/m   BP, weight, and urine reviewed.  Refer to obstetrical flow sheet for FH & FHR.  Reports good fm.  Denies regular uc's, lof, vb, or uti s/s. No complaints. [facility]- much better now. In-hospital btl vs. IUD, consent signed back in Dec, still thinking about what she wants Reviewed ptl s/s, fkc. Plan:  Continue routine obstetrical care  F/U in 1wk for OB appointment and gbs

## 2016-05-13 ENCOUNTER — Encounter: Payer: Self-pay | Admitting: Women's Health

## 2016-05-13 ENCOUNTER — Ambulatory Visit (INDEPENDENT_AMBULATORY_CARE_PROVIDER_SITE_OTHER): Payer: Medicaid Other | Admitting: Women's Health

## 2016-05-13 VITALS — BP 110/80 | HR 76 | Wt 233.0 lb

## 2016-05-13 DIAGNOSIS — Z3685 Encounter for antenatal screening for Streptococcus B: Secondary | ICD-10-CM

## 2016-05-13 DIAGNOSIS — Z1389 Encounter for screening for other disorder: Secondary | ICD-10-CM

## 2016-05-13 DIAGNOSIS — Z3A37 37 weeks gestation of pregnancy: Secondary | ICD-10-CM

## 2016-05-13 DIAGNOSIS — Z3483 Encounter for supervision of other normal pregnancy, third trimester: Secondary | ICD-10-CM

## 2016-05-13 DIAGNOSIS — Z331 Pregnant state, incidental: Secondary | ICD-10-CM

## 2016-05-13 LAB — POCT URINALYSIS DIPSTICK
Blood, UA: NEGATIVE
GLUCOSE UA: NEGATIVE
Ketones, UA: NEGATIVE
LEUKOCYTES UA: NEGATIVE
Nitrite, UA: NEGATIVE
PROTEIN UA: NEGATIVE

## 2016-05-13 NOTE — Progress Notes (Signed)
Low-risk OB appointment [id number] 6940w1d Estimated Date of Delivery: 06/02/16 BP 110/80   Pulse 76   Wt 233 lb (105.7 kg)   LMP 08/27/2015 (Approximate)   BMI 36.49 kg/m   BP, weight, and urine reviewed.  Refer to obstetrical flow sheet for FH & FHR.  Reports good fm.  Denies regular uc's, lof, vb, or uti s/s. No complaints. GBS, gc/ct collected SVE: 1/th/-2, vtx Reviewed labor s/s, fkc. Plan:  Continue routine obstetrical care  F/U in 1wk for OB appointment

## 2016-05-13 NOTE — Patient Instructions (Signed)
Call the office ([phone]) or go to [facility] if:  You begin to have strong, frequent contractions  Your water breaks.  Sometimes it is a big gush of fluid, sometimes it is just a trickle that keeps getting your panties wet or running down your legs  You have vaginal bleeding.  It is normal to have a small amount of spotting if your cervix was checked.   You don't feel your baby moving like normal.  If you don't, get you something to eat and drink and lay down and focus on feeling your baby move.  You should feel at least 10 movements in 2 hours.  If you don't, you should call the office or go to [facility].     Braxton Hicks Contractions Contractions of the uterus can occur throughout pregnancy, but they are not always a sign that you are in labor. You may have practice contractions called Braxton Hicks contractions. These false labor contractions are sometimes confused with true labor. What are Braxton Hicks contractions? Braxton Hicks contractions are tightening movements that occur in the muscles of the uterus before labor. Unlike true labor contractions, these contractions do not result in opening (dilation) and thinning of the cervix. Toward the end of pregnancy (32-34 weeks), Braxton Hicks contractions can happen more often and may become stronger. These contractions are sometimes difficult to tell apart from true labor because they can be very uncomfortable. You should not feel embarrassed if you go to the hospital with false labor. Sometimes, the only way to tell if you are in true labor is for your health care provider to look for changes in the cervix. The health care provider will do a physical exam and may monitor your contractions. If you are not in true labor, the exam should show that your cervix is not dilating and your water has not broken. If there are no prenatal problems or other health problems associated with your pregnancy, it is completely safe for you to be sent  home with false labor. You may continue to have Braxton Hicks contractions until you go into true labor. How can I tell the difference between true labor and false labor?  Differences ? False labor ? Contractions last 30-70 seconds.: Contractions are usually shorter and not as strong as true labor contractions. ? Contractions become very regular.: Contractions are usually irregular. ? Discomfort is usually felt in the top of the uterus, and it spreads to the lower abdomen and low back.: Contractions are often felt in the front of the lower abdomen and in the groin. ? Contractions do not go away with walking.: Contractions may go away when you walk around or change positions while lying down. ? Contractions usually become more intense and increase in frequency.: Contractions get weaker and are shorter-lasting as time goes on. ? The cervix dilates and gets thinner.: The cervix usually does not dilate or become thin. Follow these instructions at home:  Take over-the-counter and prescription medicines only as told by your health care provider.  Keep up with your usual exercises and follow other instructions from your health care provider.  Eat and drink lightly if you think you are going into labor.  If Braxton Hicks contractions are making you uncomfortable: ? Change your position from lying down or resting to walking, or change from walking to resting. ? Sit and rest in a tub of warm water. ? Drink enough fluid to keep your urine clear or pale yellow. Dehydration may cause these contractions. ?   Do slow and deep breathing several times an hour.  Keep all follow-up prenatal visits as told by your health care provider. This is important. Contact a health care provider if:  You have a fever.  You have continuous pain in your abdomen. Get help right away if:  Your contractions become stronger, more regular, and closer together.  You have fluid leaking or gushing from your vagina.  You  pass blood-tinged mucus (bloody show).  You have bleeding from your vagina.  You have low back pain that you never had before.  You feel your baby's head pushing down and causing pelvic pressure.  Your baby is not moving inside you as much as it used to. Summary  Contractions that occur before labor are called Braxton Hicks contractions, false labor, or practice contractions.  Braxton Hicks contractions are usually shorter, weaker, farther apart, and less regular than true labor contractions. True labor contractions usually become progressively stronger and regular and they become more frequent.  Manage discomfort from Braxton Hicks contractions by changing position, resting in a warm bath, drinking plenty of water, or practicing deep breathing. This information is not intended to replace advice given to you by your health care provider. Make sure you discuss any questions you have with your health care provider. Document Released: 02/25/2005 Document Revised: 01/15/2016 Document Reviewed: 01/15/2016 Elsevier Interactive Patient Education  2017 Elsevier Inc.  

## 2016-05-14 LAB — GC/CHLAMYDIA PROBE AMP
Chlamydia trachomatis, NAA: NEGATIVE
Neisseria gonorrhoeae by PCR: NEGATIVE

## 2016-05-14 LAB — OB RESULTS CONSOLE GBS: STREP GROUP B AG: NEGATIVE

## 2016-05-15 LAB — STREP GP B NAA: Strep Gp B NAA: NEGATIVE

## 2016-05-20 ENCOUNTER — Ambulatory Visit (INDEPENDENT_AMBULATORY_CARE_PROVIDER_SITE_OTHER): Payer: Medicaid Other | Admitting: Women's Health

## 2016-05-20 ENCOUNTER — Encounter: Payer: Self-pay | Admitting: Women's Health

## 2016-05-20 VITALS — BP 110/60 | HR 102 | Wt 237.5 lb

## 2016-05-20 DIAGNOSIS — Z1389 Encounter for screening for other disorder: Secondary | ICD-10-CM

## 2016-05-20 DIAGNOSIS — Z331 Pregnant state, incidental: Secondary | ICD-10-CM

## 2016-05-20 DIAGNOSIS — Z3A38 38 weeks gestation of pregnancy: Secondary | ICD-10-CM

## 2016-05-20 DIAGNOSIS — Z3483 Encounter for supervision of other normal pregnancy, third trimester: Secondary | ICD-10-CM

## 2016-05-20 LAB — POCT URINALYSIS DIPSTICK
Glucose, UA: NEGATIVE
Ketones, UA: NEGATIVE
Nitrite, UA: NEGATIVE
PROTEIN UA: NEGATIVE

## 2016-05-20 NOTE — Progress Notes (Signed)
Low-risk OB appointment [id number] 6461w1d Estimated Date of Delivery: 06/02/16 BP 110/60   Pulse (!) 102   Wt 237 lb 8 oz (107.7 kg)   LMP 08/27/2015 (Approximate)   BMI 37.20 kg/m   BP, weight, and urine reviewed.  Refer to obstetrical flow sheet for FH & FHR.  Reports good fm.  Denies regular uc's, lof, vb, or uti s/s. No complaints. SVE per request: 1.5/50/-2, vtx Reviewed labor s/s, fkc, gbs-. Plan:  Continue routine obstetrical care  F/U in 1wk for OB appointment

## 2016-05-20 NOTE — Patient Instructions (Signed)
Call the office ([phone]) or go to [facility] if:  You begin to have strong, frequent contractions  Your water breaks.  Sometimes it is a big gush of fluid, sometimes it is just a trickle that keeps getting your panties wet or running down your legs  You have vaginal bleeding.  It is normal to have a small amount of spotting if your cervix was checked.   You don't feel your baby moving like normal.  If you don't, get you something to eat and drink and lay down and focus on feeling your baby move.  You should feel at least 10 movements in 2 hours.  If you don't, you should call the office or go to [facility].     Braxton Hicks Contractions Contractions of the uterus can occur throughout pregnancy, but they are not always a sign that you are in labor. You may have practice contractions called Braxton Hicks contractions. These false labor contractions are sometimes confused with true labor. What are Braxton Hicks contractions? Braxton Hicks contractions are tightening movements that occur in the muscles of the uterus before labor. Unlike true labor contractions, these contractions do not result in opening (dilation) and thinning of the cervix. Toward the end of pregnancy (32-34 weeks), Braxton Hicks contractions can happen more often and may become stronger. These contractions are sometimes difficult to tell apart from true labor because they can be very uncomfortable. You should not feel embarrassed if you go to the hospital with false labor. Sometimes, the only way to tell if you are in true labor is for your health care provider to look for changes in the cervix. The health care provider will do a physical exam and may monitor your contractions. If you are not in true labor, the exam should show that your cervix is not dilating and your water has not broken. If there are no prenatal problems or other health problems associated with your pregnancy, it is completely safe for you to be sent  home with false labor. You may continue to have Braxton Hicks contractions until you go into true labor. How can I tell the difference between true labor and false labor?  Differences ? False labor ? Contractions last 30-70 seconds.: Contractions are usually shorter and not as strong as true labor contractions. ? Contractions become very regular.: Contractions are usually irregular. ? Discomfort is usually felt in the top of the uterus, and it spreads to the lower abdomen and low back.: Contractions are often felt in the front of the lower abdomen and in the groin. ? Contractions do not go away with walking.: Contractions may go away when you walk around or change positions while lying down. ? Contractions usually become more intense and increase in frequency.: Contractions get weaker and are shorter-lasting as time goes on. ? The cervix dilates and gets thinner.: The cervix usually does not dilate or become thin. Follow these instructions at home:  Take over-the-counter and prescription medicines only as told by your health care provider.  Keep up with your usual exercises and follow other instructions from your health care provider.  Eat and drink lightly if you think you are going into labor.  If Braxton Hicks contractions are making you uncomfortable: ? Change your position from lying down or resting to walking, or change from walking to resting. ? Sit and rest in a tub of warm water. ? Drink enough fluid to keep your urine clear or pale yellow. Dehydration may cause these contractions. ?   Do slow and deep breathing several times an hour.  Keep all follow-up prenatal visits as told by your health care provider. This is important. Contact a health care provider if:  You have a fever.  You have continuous pain in your abdomen. Get help right away if:  Your contractions become stronger, more regular, and closer together.  You have fluid leaking or gushing from your vagina.  You  pass blood-tinged mucus (bloody show).  You have bleeding from your vagina.  You have low back pain that you never had before.  You feel your baby's head pushing down and causing pelvic pressure.  Your baby is not moving inside you as much as it used to. Summary  Contractions that occur before labor are called Braxton Hicks contractions, false labor, or practice contractions.  Braxton Hicks contractions are usually shorter, weaker, farther apart, and less regular than true labor contractions. True labor contractions usually become progressively stronger and regular and they become more frequent.  Manage discomfort from Braxton Hicks contractions by changing position, resting in a warm bath, drinking plenty of water, or practicing deep breathing. This information is not intended to replace advice given to you by your health care provider. Make sure you discuss any questions you have with your health care provider. Document Released: 02/25/2005 Document Revised: 01/15/2016 Document Reviewed: 01/15/2016 Elsevier Interactive Patient Education  2017 Elsevier Inc.  

## 2016-05-28 ENCOUNTER — Ambulatory Visit (INDEPENDENT_AMBULATORY_CARE_PROVIDER_SITE_OTHER): Payer: Medicaid Other | Admitting: Women's Health

## 2016-05-28 ENCOUNTER — Encounter: Payer: Self-pay | Admitting: Women's Health

## 2016-05-28 VITALS — BP 110/70 | HR 74 | Wt 235.4 lb

## 2016-05-28 DIAGNOSIS — Z331 Pregnant state, incidental: Secondary | ICD-10-CM

## 2016-05-28 DIAGNOSIS — Z1389 Encounter for screening for other disorder: Secondary | ICD-10-CM

## 2016-05-28 DIAGNOSIS — Z3483 Encounter for supervision of other normal pregnancy, third trimester: Secondary | ICD-10-CM

## 2016-05-28 LAB — POCT URINALYSIS DIPSTICK
GLUCOSE UA: NEGATIVE
Ketones, UA: NEGATIVE
Leukocytes, UA: NEGATIVE
NITRITE UA: NEGATIVE
Protein, UA: NEGATIVE
RBC UA: NEGATIVE

## 2016-05-28 NOTE — Patient Instructions (Signed)
Call the office ([phone]) or go to [facility] if:  You begin to have strong, frequent contractions  Your water breaks.  Sometimes it is a big gush of fluid, sometimes it is just a trickle that keeps getting your panties wet or running down your legs  You have vaginal bleeding.  It is normal to have a small amount of spotting if your cervix was checked.   You don't feel your baby moving like normal.  If you don't, get you something to eat and drink and lay down and focus on feeling your baby move.  You should feel at least 10 movements in 2 hours.  If you don't, you should call the office or go to [facility].     Braxton Hicks Contractions Contractions of the uterus can occur throughout pregnancy, but they are not always a sign that you are in labor. You may have practice contractions called Braxton Hicks contractions. These false labor contractions are sometimes confused with true labor. What are Braxton Hicks contractions? Braxton Hicks contractions are tightening movements that occur in the muscles of the uterus before labor. Unlike true labor contractions, these contractions do not result in opening (dilation) and thinning of the cervix. Toward the end of pregnancy (32-34 weeks), Braxton Hicks contractions can happen more often and may become stronger. These contractions are sometimes difficult to tell apart from true labor because they can be very uncomfortable. You should not feel embarrassed if you go to the hospital with false labor. Sometimes, the only way to tell if you are in true labor is for your health care provider to look for changes in the cervix. The health care provider will do a physical exam and may monitor your contractions. If you are not in true labor, the exam should show that your cervix is not dilating and your water has not broken. If there are no prenatal problems or other health problems associated with your pregnancy, it is completely safe for you to be sent  home with false labor. You may continue to have Braxton Hicks contractions until you go into true labor. How can I tell the difference between true labor and false labor?  Differences ? False labor ? Contractions last 30-70 seconds.: Contractions are usually shorter and not as strong as true labor contractions. ? Contractions become very regular.: Contractions are usually irregular. ? Discomfort is usually felt in the top of the uterus, and it spreads to the lower abdomen and low back.: Contractions are often felt in the front of the lower abdomen and in the groin. ? Contractions do not go away with walking.: Contractions may go away when you walk around or change positions while lying down. ? Contractions usually become more intense and increase in frequency.: Contractions get weaker and are shorter-lasting as time goes on. ? The cervix dilates and gets thinner.: The cervix usually does not dilate or become thin. Follow these instructions at home:  Take over-the-counter and prescription medicines only as told by your health care provider.  Keep up with your usual exercises and follow other instructions from your health care provider.  Eat and drink lightly if you think you are going into labor.  If Braxton Hicks contractions are making you uncomfortable: ? Change your position from lying down or resting to walking, or change from walking to resting. ? Sit and rest in a tub of warm water. ? Drink enough fluid to keep your urine clear or pale yellow. Dehydration may cause these contractions. ?   Do slow and deep breathing several times an hour.  Keep all follow-up prenatal visits as told by your health care provider. This is important. Contact a health care provider if:  You have a fever.  You have continuous pain in your abdomen. Get help right away if:  Your contractions become stronger, more regular, and closer together.  You have fluid leaking or gushing from your vagina.  You  pass blood-tinged mucus (bloody show).  You have bleeding from your vagina.  You have low back pain that you never had before.  You feel your baby's head pushing down and causing pelvic pressure.  Your baby is not moving inside you as much as it used to. Summary  Contractions that occur before labor are called Braxton Hicks contractions, false labor, or practice contractions.  Braxton Hicks contractions are usually shorter, weaker, farther apart, and less regular than true labor contractions. True labor contractions usually become progressively stronger and regular and they become more frequent.  Manage discomfort from Braxton Hicks contractions by changing position, resting in a warm bath, drinking plenty of water, or practicing deep breathing. This information is not intended to replace advice given to you by your health care provider. Make sure you discuss any questions you have with your health care provider. Document Released: 02/25/2005 Document Revised: 01/15/2016 Document Reviewed: 01/15/2016 Elsevier Interactive Patient Education  2017 Elsevier Inc.  

## 2016-05-28 NOTE — Progress Notes (Signed)
Low-risk OB appointment [id number] 6827w2d Estimated Date of Delivery: 06/02/16 BP 110/70   Pulse 74   Wt 235 lb 6.4 oz (106.8 kg)   LMP 08/27/2015 (Approximate)   BMI 36.87 kg/m   BP, weight, and urine reviewed.  Refer to obstetrical flow sheet for FH & FHR.  Reports good fm.  Denies regular uc's, lof, vb, or uti s/s. No complaints. SVE per request: 3/50/-2, vtx Offered membrane sweeping, discussed r/b- pt decided to proceed, so membranes swept.  FHR irregular today Reviewed labor s/s, fkc. Plan:  Continue routine obstetrical care  F/U in 1wk for OB appointment

## 2016-05-29 ENCOUNTER — Inpatient Hospital Stay (EMERGENCY_DEPARTMENT_HOSPITAL)
Admission: AD | Admit: 2016-05-29 | Discharge: 2016-05-29 | Disposition: A | Discharge status: Home / Self Care | Payer: Medicaid Other | Source: Ambulatory Visit | Attending: Obstetrics & Gynecology | Admitting: Obstetrics & Gynecology

## 2016-05-29 ENCOUNTER — Encounter (HOSPITAL_COMMUNITY): Payer: Self-pay

## 2016-05-29 DIAGNOSIS — O368130 Decreased fetal movements, third trimester, not applicable or unspecified: Secondary | ICD-10-CM | POA: Diagnosis not present

## 2016-05-29 DIAGNOSIS — O368390 Maternal care for abnormalities of the fetal heart rate or rhythm, unspecified trimester, not applicable or unspecified: Secondary | ICD-10-CM

## 2016-05-29 DIAGNOSIS — Z87891 Personal history of nicotine dependence: Secondary | ICD-10-CM

## 2016-05-29 DIAGNOSIS — Z3A39 39 weeks gestation of pregnancy: Secondary | ICD-10-CM | POA: Insufficient documentation

## 2016-05-29 DIAGNOSIS — Z6791 Unspecified blood type, Rh negative: Secondary | ICD-10-CM | POA: Insufficient documentation

## 2016-05-29 DIAGNOSIS — O26893 Other specified pregnancy related conditions, third trimester: Secondary | ICD-10-CM | POA: Insufficient documentation

## 2016-05-29 DIAGNOSIS — Z3483 Encounter for supervision of other normal pregnancy, third trimester: Secondary | ICD-10-CM

## 2016-05-29 NOTE — MAU Provider Note (Signed)
History     CSN: [id number]  Arrival date and time: 05/29/16 1655   None     Chief Complaint  Patient presents with  . Decreased Fetal Movement   Patient is a 26 y/o G4P2 who presents with decreased fetal movement. She reports she woke up this morning and did not feel much movement. She did kick counts and did not feel movement. She then ate some cookies and drank some milk and felt three small movements. She called the oncall nurse and was instructed to come to MAU for evaluation. She reports irregular abdominal cramping that is low in intensity 3 out of 10 or so. She denies any vaginal bleeding.     OB History    Gravida Para Term Preterm AB Living   4 2 2   1 2    SAB TAB Ectopic Multiple Live Births   1     0 2      Past Medical History:  Diagnosis Date  . Anemia   . Herpes   . Miscarriage   . Rh negative state in antepartum period   . Vaginal Pap smear, abnormal     Past Surgical History:  Procedure Laterality Date  . CHOLECYSTECTOMY N/A 01/04/2015   Procedure: LAPAROSCOPIC CHOLECYSTECTOMY;  Surgeon: Franky Macho, MD;  Location: [facility];  Service: General;  Laterality: N/A;  . TONSILLECTOMY      Family History  Problem Relation Age of Onset  . Cancer Maternal Grandmother     breast  . Diabetes Paternal Grandfather   . Jaundice Son     Social History  Substance Use Topics  . Smoking status: Former Smoker    Packs/day: 0.50    Years: 9.00    Types: Cigarettes    Quit date: 01/01/2013  . Smokeless tobacco: Never Used  . Alcohol use No     Comment: occasional    Allergies: No Known Allergies  Prescriptions Prior to Admission  Medication Sig Dispense Refill Last Dose  . acetaminophen (TYLENOL) 325 MG tablet Take 650 mg by mouth every 6 (six) hours as needed for mild pain or headache.    Taking  . acyclovir (ZOVIRAX) 400 MG tablet Take 400 mg by mouth 3 (three) times daily.   Taking  . ferrous sulfate 325 (65 FE) MG tablet Take 1 tablet (325 mg total)  by mouth 2 (two) times daily with a meal. 60 tablet 3 Taking  . Prenatal Vit-Fe Fumarate-FA (PRENATAL VITAMIN PO) Take 1 tablet by mouth at bedtime.    Taking    Review of Systems  Constitutional: Negative for chills and fever.  HENT: Negative for congestion and rhinorrhea.   Respiratory: Negative for cough and shortness of breath.   Cardiovascular: Negative for chest pain and palpitations.  Gastrointestinal: Positive for abdominal pain. Negative for abdominal distention, constipation, diarrhea, nausea and vomiting.  Genitourinary: Negative for difficulty urinating, dysuria and frequency.  Neurological: Negative for dizziness and weakness.   Physical Exam   Blood pressure 108/66, pulse 93, resp. rate 18, last menstrual period 08/27/2015, unknown if currently breastfeeding.  Physical Exam  Vitals reviewed. Constitutional: She is oriented to person, place, and time. She appears well-developed and well-nourished.  HENT:  Head: Normocephalic and atraumatic.  Neck: Normal range of motion. Neck supple.  Cardiovascular: Normal rate and intact distal pulses.   Respiratory: Effort normal and breath sounds normal.  GI: Soft. Bowel sounds are normal. She exhibits no distension. There is no tenderness. There is no rebound and no  guarding.  Musculoskeletal: Normal range of motion. She exhibits no edema.  Neurological: She is alert and oriented to person, place, and time. No cranial nerve deficit.  Skin: Skin is warm and dry. No erythema.  Psychiatric: She has a normal mood and affect. Her behavior is normal.    MAU Course  Procedures  MDM In MAU, Patient underwent NST. She had no contractions on the monitor. Patient felt regular fetal movment in mAU.  HR was 145/mod var/reactive  Assessment and Plan  1. Decreased fetal movement: reactive NST. Advised kick counts. OK to D/C home.   Ernestina PennaNicholas Jaimee Corum MD 05/29/2016, 5:31 PM

## 2016-05-29 NOTE — Discharge Instructions (Signed)
Fetal Movement Counts  Patient Name: ________________________________________________ Patient Due Date: ____________________  What is a fetal movement count?  A fetal movement count is the number of times that you feel your baby move during a certain amount of time. This may also be called a fetal kick count. A fetal movement count is recommended for every pregnant woman. You may be asked to start counting fetal movements as early as week 28 of your pregnancy.  Pay attention to when your baby is most active. You may notice your baby's sleep and wake cycles. You may also notice things that make your baby move more. You should do a fetal movement count:  · When your baby is normally most active.  · At the same time each day.    A good time to count movements is while you are resting, after having something to eat and drink.  How do I count fetal movements?  1. Find a quiet, comfortable area. Sit, or lie down on your side.  2. Write down the date, the start time and stop time, and the number of movements that you felt between those two times. Take this information with you to your health care visits.  3. For 2 hours, count kicks, flutters, swishes, rolls, and jabs. You should feel at least 10 movements during 2 hours.  4. You may stop counting after you have felt 10 movements.  5. If you do not feel 10 movements in 2 hours, have something to eat and drink. Then, keep resting and counting for 1 hour. If you feel at least 4 movements during that hour, you may stop counting.  Contact a health care provider if:  · You feel fewer than 4 movements in 2 hours.  · Your baby is not moving like he or she usually does.  Date: ____________ Start time: ____________ Stop time: ____________ Movements: ____________  Date: ____________ Start time: ____________ Stop time: ____________ Movements: ____________  Date: ____________ Start time: ____________ Stop time: ____________ Movements: ____________  Date: ____________ Start time:  ____________ Stop time: ____________ Movements: ____________  Date: ____________ Start time: ____________ Stop time: ____________ Movements: ____________  Date: ____________ Start time: ____________ Stop time: ____________ Movements: ____________  Date: ____________ Start time: ____________ Stop time: ____________ Movements: ____________  Date: ____________ Start time: ____________ Stop time: ____________ Movements: ____________  Date: ____________ Start time: ____________ Stop time: ____________ Movements: ____________  This information is not intended to replace advice given to you by your health care provider. Make sure you discuss any questions you have with your health care provider.  Document Released: 03/27/2006 Document Revised: 10/25/2015 Document Reviewed: 04/06/2015  Elsevier Interactive Patient Education © 2017 Elsevier Inc.

## 2016-05-29 NOTE — MAU Note (Signed)
Pt presents to MAU with decreased fetal movement. Reports she last felt fetal movement on the way here. Pt reports she did fetal kick count today and felt 4 movements in 2 hours. Denies any bleeding or leaking of fluid.

## 2016-05-30 ENCOUNTER — Encounter (HOSPITAL_COMMUNITY): Payer: Self-pay | Admitting: *Deleted

## 2016-05-30 ENCOUNTER — Inpatient Hospital Stay (HOSPITAL_COMMUNITY): Payer: Medicaid Other | Admitting: Anesthesiology

## 2016-05-30 ENCOUNTER — Inpatient Hospital Stay (HOSPITAL_COMMUNITY)
Admission: AD | Admit: 2016-05-30 | Discharge: 2016-06-01 | DRG: 774 | Disposition: A | Discharge status: Home / Self Care | Payer: Medicaid Other | Source: Ambulatory Visit | Attending: Obstetrics & Gynecology | Admitting: Obstetrics & Gynecology

## 2016-05-30 DIAGNOSIS — Z87891 Personal history of nicotine dependence: Secondary | ICD-10-CM | POA: Diagnosis not present

## 2016-05-30 DIAGNOSIS — O26893 Other specified pregnancy related conditions, third trimester: Secondary | ICD-10-CM | POA: Diagnosis present

## 2016-05-30 DIAGNOSIS — O368390 Maternal care for abnormalities of the fetal heart rate or rhythm, unspecified trimester, not applicable or unspecified: Secondary | ICD-10-CM

## 2016-05-30 DIAGNOSIS — O4202 Full-term premature rupture of membranes, onset of labor within 24 hours of rupture: Secondary | ICD-10-CM | POA: Diagnosis present

## 2016-05-30 DIAGNOSIS — Z6791 Unspecified blood type, Rh negative: Secondary | ICD-10-CM | POA: Diagnosis not present

## 2016-05-30 DIAGNOSIS — Z3A39 39 weeks gestation of pregnancy: Secondary | ICD-10-CM | POA: Diagnosis not present

## 2016-05-30 DIAGNOSIS — O368130 Decreased fetal movements, third trimester, not applicable or unspecified: Secondary | ICD-10-CM | POA: Diagnosis not present

## 2016-05-30 DIAGNOSIS — O9832 Other infections with a predominantly sexual mode of transmission complicating childbirth: Secondary | ICD-10-CM | POA: Diagnosis present

## 2016-05-30 DIAGNOSIS — O4292 Full-term premature rupture of membranes, unspecified as to length of time between rupture and onset of labor: Secondary | ICD-10-CM | POA: Diagnosis present

## 2016-05-30 DIAGNOSIS — A6000 Herpesviral infection of urogenital system, unspecified: Secondary | ICD-10-CM | POA: Diagnosis present

## 2016-05-30 DIAGNOSIS — Z833 Family history of diabetes mellitus: Secondary | ICD-10-CM | POA: Diagnosis not present

## 2016-05-30 DIAGNOSIS — Z3483 Encounter for supervision of other normal pregnancy, third trimester: Secondary | ICD-10-CM

## 2016-05-30 LAB — POCT FERN TEST: POCT Fern Test: POSITIVE

## 2016-05-30 LAB — TYPE AND SCREEN
ABO/RH(D): A NEG
Antibody Screen: NEGATIVE

## 2016-05-30 LAB — CBC
HCT: 34.3 % — ABNORMAL LOW (ref 36.0–46.0)
Hemoglobin: 11.4 g/dL — ABNORMAL LOW (ref 12.0–15.0)
MCH: 27.7 pg (ref 26.0–34.0)
MCHC: 33.2 g/dL (ref 30.0–36.0)
MCV: 83.3 fL (ref 78.0–100.0)
Platelets: 166 10*3/uL (ref 150–400)
RBC: 4.12 MIL/uL (ref 3.87–5.11)
RDW: 16.1 % — ABNORMAL HIGH (ref 11.5–15.5)
WBC: 10.7 10*3/uL — ABNORMAL HIGH (ref 4.0–10.5)

## 2016-05-30 MED ORDER — FLEET ENEMA 7-19 GM/118ML RE ENEM: 1.0000 | ENEMA | Freq: Every day | RECTAL | Status: DC | PRN

## 2016-05-30 MED ORDER — ACETAMINOPHEN 325 MG PO TABS: 650.0000 mg | ORAL_TABLET | ORAL | Status: DC | PRN

## 2016-05-30 MED ORDER — OXYTOCIN BOLUS FROM INFUSION
500.0000 mL | Freq: Once | INTRAVENOUS | Status: DC
Start: 2016-05-30 — End: 2016-05-30

## 2016-05-30 MED ORDER — LACTATED RINGERS IV SOLN
INTRAVENOUS | Status: DC
  Administered 2016-05-30 (×2): via INTRAVENOUS

## 2016-05-30 MED ORDER — PHENYLEPHRINE 40 MCG/ML (10ML) SYRINGE FOR IV PUSH (FOR BLOOD PRESSURE SUPPORT)
80.0000 ug | PREFILLED_SYRINGE | INTRAVENOUS | Status: DC | PRN
  Filled 2016-05-30: qty 5
  Filled 2016-05-30: qty 10

## 2016-05-30 MED ORDER — OXYCODONE-ACETAMINOPHEN 5-325 MG PO TABS: 2.0000 | ORAL_TABLET | ORAL | Status: DC | PRN

## 2016-05-30 MED ORDER — WITCH HAZEL-GLYCERIN EX PADS: 1.0000 "application " | MEDICATED_PAD | CUTANEOUS | Status: DC | PRN

## 2016-05-30 MED ORDER — IBUPROFEN 600 MG PO TABS
600.0000 mg | ORAL_TABLET | Freq: Four times a day (QID) | ORAL | Status: DC
  Administered 2016-05-30 – 2016-06-01 (×8): 600 mg via ORAL
  Filled 2016-05-30 (×8): qty 1

## 2016-05-30 MED ORDER — TETANUS-DIPHTH-ACELL PERTUSSIS 5-2.5-18.5 LF-MCG/0.5 IM SUSP: 0.5000 mL | Freq: Once | INTRAMUSCULAR | Status: DC

## 2016-05-30 MED ORDER — PHENYLEPHRINE 40 MCG/ML (10ML) SYRINGE FOR IV PUSH (FOR BLOOD PRESSURE SUPPORT)
80.0000 ug | PREFILLED_SYRINGE | INTRAVENOUS | Status: DC | PRN
  Filled 2016-05-30: qty 5

## 2016-05-30 MED ORDER — PRENATAL MULTIVITAMIN CH
1.0000 | ORAL_TABLET | Freq: Every day | ORAL | Status: DC
  Administered 2016-05-30 – 2016-06-01 (×2): 1 via ORAL
  Filled 2016-05-30 (×2): qty 1

## 2016-05-30 MED ORDER — SIMETHICONE 80 MG PO CHEW: 80.0000 mg | CHEWABLE_TABLET | ORAL | Status: DC | PRN

## 2016-05-30 MED ORDER — EPHEDRINE 5 MG/ML INJ
10.0000 mg | INTRAVENOUS | Status: DC | PRN
  Filled 2016-05-30: qty 2

## 2016-05-30 MED ORDER — DIPHENHYDRAMINE HCL 50 MG/ML IJ SOLN: 12.5000 mg | INTRAMUSCULAR | Status: DC | PRN

## 2016-05-30 MED ORDER — OXYTOCIN 40 UNITS IN LACTATED RINGERS INFUSION - SIMPLE MED
2.5000 [IU]/h | INTRAVENOUS | Status: DC
  Administered 2016-05-30: 2.5 [IU]/h via INTRAVENOUS

## 2016-05-30 MED ORDER — TERBUTALINE SULFATE 1 MG/ML IJ SOLN
0.2500 mg | Freq: Once | INTRAMUSCULAR | Status: DC | PRN
  Filled 2016-05-30: qty 1

## 2016-05-30 MED ORDER — LACTATED RINGERS IV SOLN: 500.0000 mL | INTRAVENOUS | Status: DC | PRN

## 2016-05-30 MED ORDER — ZOLPIDEM TARTRATE 5 MG PO TABS
5.0000 mg | ORAL_TABLET | Freq: Every evening | ORAL | Status: DC | PRN
  Administered 2016-05-30: 5 mg via ORAL
  Filled 2016-05-30: qty 1

## 2016-05-30 MED ORDER — OXYCODONE-ACETAMINOPHEN 5-325 MG PO TABS: 1.0000 | ORAL_TABLET | ORAL | Status: DC | PRN

## 2016-05-30 MED ORDER — SENNOSIDES-DOCUSATE SODIUM 8.6-50 MG PO TABS
2.0000 | ORAL_TABLET | ORAL | Status: DC
  Administered 2016-05-31 (×2): 2 via ORAL
  Filled 2016-05-30 (×3): qty 2

## 2016-05-30 MED ORDER — ZOLPIDEM TARTRATE 5 MG PO TABS: 5.0000 mg | ORAL_TABLET | Freq: Every evening | ORAL | Status: DC | PRN

## 2016-05-30 MED ORDER — ONDANSETRON HCL 4 MG PO TABS: 4.0000 mg | ORAL_TABLET | ORAL | Status: DC | PRN

## 2016-05-30 MED ORDER — SOD CITRATE-CITRIC ACID 500-334 MG/5ML PO SOLN: 30.0000 mL | ORAL | Status: DC | PRN

## 2016-05-30 MED ORDER — LACTATED RINGERS IV SOLN: 500.0000 mL | Freq: Once | INTRAVENOUS | Status: DC

## 2016-05-30 MED ORDER — OXYTOCIN 40 UNITS IN LACTATED RINGERS INFUSION - SIMPLE MED
1.0000 m[IU]/min | INTRAVENOUS | Status: DC
  Administered 2016-05-30: 2 m[IU]/min via INTRAVENOUS
  Filled 2016-05-30: qty 1000

## 2016-05-30 MED ORDER — DIPHENHYDRAMINE HCL 25 MG PO CAPS: 25.0000 mg | ORAL_CAPSULE | Freq: Four times a day (QID) | ORAL | Status: DC | PRN

## 2016-05-30 MED ORDER — LIDOCAINE HCL (PF) 1 % IJ SOLN
INTRAMUSCULAR | Status: DC | PRN
  Administered 2016-05-30: 4 mL via EPIDURAL

## 2016-05-30 MED ORDER — LIDOCAINE HCL (PF) 1 % IJ SOLN
30.0000 mL | INTRAMUSCULAR | Status: DC | PRN
  Filled 2016-05-30: qty 30

## 2016-05-30 MED ORDER — ONDANSETRON HCL 4 MG/2ML IJ SOLN: 4.0000 mg | Freq: Four times a day (QID) | INTRAMUSCULAR | Status: DC | PRN

## 2016-05-30 MED ORDER — COCONUT OIL OIL: 1.0000 "application " | TOPICAL_OIL | Status: DC | PRN

## 2016-05-30 MED ORDER — ONDANSETRON HCL 4 MG/2ML IJ SOLN: 4.0000 mg | INTRAMUSCULAR | Status: DC | PRN

## 2016-05-30 MED ORDER — DIBUCAINE 1 % RE OINT: 1.0000 "application " | TOPICAL_OINTMENT | RECTAL | Status: DC | PRN

## 2016-05-30 MED ORDER — FENTANYL 2.5 MCG/ML BUPIVACAINE 1/10 % EPIDURAL INFUSION (WH - ANES)
14.0000 mL/h | INTRAMUSCULAR | Status: DC | PRN
  Administered 2016-05-30: 14 mL/h via EPIDURAL
  Filled 2016-05-30: qty 100

## 2016-05-30 MED ORDER — ACETAMINOPHEN 325 MG PO TABS
650.0000 mg | ORAL_TABLET | ORAL | Status: DC | PRN
  Administered 2016-05-30 – 2016-06-01 (×3): 650 mg via ORAL
  Filled 2016-05-30 (×3): qty 2

## 2016-05-30 MED ORDER — BENZOCAINE-MENTHOL 20-0.5 % EX AERO: 1.0000 "application " | INHALATION_SPRAY | CUTANEOUS | Status: DC | PRN

## 2016-05-30 NOTE — Anesthesia Procedure Notes (Signed)
Epidural Patient location during procedure: OB Start time: 05/30/2016 8:45 AM End time: 05/30/2016 8:52 AM  Staffing Anesthesiologist: Shona SimpsonHOLLIS, Kristina [name] Performed: anesthesiologist   Preanesthetic Checklist Completed: patient identified, site marked, surgical consent, pre-op evaluation, timeout performed, IV checked, risks and benefits discussed and monitors and equipment checked  Epidural Patient position: sitting Prep: ChloraPrep Patient monitoring: heart rate, continuous pulse ox and blood pressure Approach: midline Location: L3-L4 Injection technique: LOR saline  Needle:  Needle type: Tuohy  Needle gauge: 17 G Needle length: 9 cm Catheter type: closed end flexible Catheter size: 20 Guage Test dose: negative and 1.5% lidocaine  Assessment Events: blood not aspirated, injection not painful, no injection resistance and no paresthesia  Additional Notes LOR @ 6  Patient identified. Risks/Benefits/Options discussed with patient including but not limited to bleeding, infection, nerve damage, paralysis, failed block, incomplete pain control, headache, blood pressure changes, nausea, vomiting, reactions to medications, itching and postpartum back pain. Confirmed with bedside nurse the patient's most recent platelet count. Confirmed with patient that they are not currently taking any anticoagulation, have any bleeding history or any family history of bleeding disorders. Patient expressed understanding and wished to proceed. All questions were answered. Sterile technique was used throughout the entire procedure. Please see nursing notes for vital signs. Test dose was given through epidural catheter and negative prior to continuing to dose epidural or start infusion. Warning signs of high block given to the patient including shortness of breath, tingling/numbness in hands, complete motor block, or any concerning symptoms with instructions to call for help. Patient was given instructions on fall  risk and not to get out of bed. All questions and concerns addressed with instructions to call with any issues or inadequate analgesia.    Reason for block:procedure for pain

## 2016-05-30 NOTE — Anesthesia Preprocedure Evaluation (Addendum)
Anesthesia Evaluation  Patient identified by MRN, date of birth, ID band Patient awake    Reviewed: Allergy & Precautions, NPO status , Patient's Chart, lab work & pertinent test results  Airway Mallampati: III  TM Distance: >3 FB Neck ROM: Full    Dental  (+) Teeth Intact, Dental Advisory Given   Pulmonary former smoker,    breath sounds clear to auscultation       Cardiovascular negative cardio ROS   Rhythm:Regular Rate:Normal     Neuro/Psych  Headaches, PSYCHIATRIC DISORDERS Depression    GI/Hepatic negative GI ROS, Neg liver ROS,   Endo/Other  negative endocrine ROS  Renal/GU negative Renal ROS  negative genitourinary   Musculoskeletal negative musculoskeletal ROS (+)   Abdominal   Peds negative pediatric ROS (+)  Hematology negative hematology ROS (+)   Anesthesia Other Findings   Reproductive/Obstetrics (+) Pregnancy                            Lab Results  Component Value Date   WBC 10.7 (H) 05/30/2016   HGB 11.4 (L) 05/30/2016   HCT 34.3 (L) 05/30/2016   MCV 83.3 05/30/2016   PLT 166 05/30/2016    Anesthesia Physical Anesthesia Plan  ASA: II  Anesthesia Plan: Epidural   Post-op Pain Management:    Induction:   Airway Management Planned:   Additional Equipment:   Intra-op Plan:   Post-operative Plan:   Informed Consent: I have reviewed the patients History and Physical, chart, labs and discussed the procedure including the risks, benefits and alternatives for the proposed anesthesia with the patient or authorized representative who has indicated his/her understanding and acceptance.     Plan Discussed with:   Anesthesia Plan Comments:         Anesthesia Quick Evaluation

## 2016-05-30 NOTE — H&P (Signed)
LABOR AND DELIVERY ADMISSION HISTORY AND PHYSICAL NOTE  Kristina [name] is a 26 y.o. female [phone] with IUP at 5019w4d by LMP consistent w 6 week US presenting for term PROM.  Patient states that she felt a gush of fluid around 12:30am today.  She denies contractions, but reports some painful cramps, 6/10 intensity.   She reports positive fetal movement. She denies vaginal bleeding.  Prenatal History/Complications:  Past Medical History: Past Medical History:  Diagnosis Date  . Anemia   . Herpes   . Miscarriage   . Rh negative state in antepartum period   . Vaginal Pap smear, abnormal     Past Surgical History: Past Surgical History:  Procedure Laterality Date  . CHOLECYSTECTOMY N/A 01/04/2015   Procedure: LAPAROSCOPIC CHOLECYSTECTOMY;  Surgeon: Franky MachoMark Jenkins, MD;  Location: [facility];  Service: General;  Laterality: N/A;  . TONSILLECTOMY      Obstetrical History: OB History    Gravida Para Term Preterm AB Living   4 2 2   1 2    SAB TAB Ectopic Multiple Live Births   1     0 2      Social History: Social History   Social History  . Marital status: Married    Spouse name: N/A  . Number of children: N/A  . Years of education: N/A   Social History Main Topics  . Smoking status: Former Smoker    Packs/day: 0.50    Years: 9.00    Types: Cigarettes    Quit date: 01/01/2013  . Smokeless tobacco: Never Used  . Alcohol use No     Comment: occasional  . Drug use: No  . Sexual activity: Yes    Birth control/ protection: None   Other Topics Concern  . None   Social History Narrative  . None    Family History: Family History  Problem Relation Age of Onset  . Cancer Maternal Grandmother     breast  . Diabetes Paternal Grandfather   . Jaundice Son     Allergies: No Known Allergies  Prescriptions Prior to Admission  Medication Sig Dispense Refill Last Dose  . acyclovir (ZOVIRAX) 400 MG tablet Take 400 mg by mouth 3 (three) times daily.   05/29/2016 at Unknown  time  . ferrous sulfate 325 (65 FE) MG tablet Take 1 tablet (325 mg total) by mouth 2 (two) times daily with a meal. 60 tablet 3 05/29/2016 at Unknown time  . Prenatal Vit-Fe Fumarate-FA (PRENATAL VITAMIN PO) Take 1 tablet by mouth at bedtime.    05/29/2016 at Unknown time  . acetaminophen (TYLENOL) 325 MG tablet Take 650 mg by mouth every 6 (six) hours as needed for mild pain or headache.    Taking     Review of Systems   All systems reviewed and negative except as stated in HPI  Blood pressure 120/78, pulse (!) 106, temperature 98.2 F (36.8 C), temperature source Oral, resp. rate 20, height 5\' 7"  (1.702 m), weight 235 lb (106.6 kg), last menstrual period 08/27/2015, unknown if currently breastfeeding. General appearance: alert, cooperative and no distress Lungs: clear to auscultation bilaterally Heart: regular rate and rhythm Abdomen: soft, non-tender; bowel sounds normal Extremities: No calf swelling or tenderness Presentation: cephalic by US Fetal monitoring: 140s Uterine activity: irritability  Station: +2   Prenatal labs: ABO, Rh: A/Negative/-- (08/15 1121) Antibody: Negative (12/29 0902) Rubella: !Error! RPR: Non Reactive (12/29 0902)  HBsAg: Negative (08/15 1121)  HIV: Non Reactive (12/29 0902)  GBS: Negative (  03/05 1330)  2 hr Glucola: normal Genetic screening:  negative Anatomy US: normal  Prenatal Transfer Tool  Maternal Diabetes: No Genetic Screening: Normal Maternal Ultrasounds/Referrals: NA Fetal Ultrasounds or other Referrals:  None Maternal Substance Abuse:  No Significant Maternal Medications:  Meds include: Other: Acyclovir Significant Maternal Lab Results: None  Results for orders placed or performed during the hospital encounter of 05/30/16 (from the past 24 hour(s))  [facility] Time: 05/30/16  2:07 AM  Result Value Ref Range   POCT Fern Test Positive = ruptured amniotic membanes     Patient Active Problem List   Diagnosis Date Noted   . Full-term premature rupture of membranes 05/30/2016  . Irregular fetal heart rate 02/14/2016  . Dehydration, moderate 11/28/2015  . Gastroenteritis 11/28/2015  . Hypokalemia 11/28/2015  . Pregnancy 11/28/2015  . Viral gastroenteritis 11/28/2015  . Depression 11/21/2015  . First trimester bleeding 11/21/2015  . Supervision of normal pregnancy 10/24/2015  . Left ovarian cyst 10/24/2015  . Frequent headaches 09/05/2015  . Obese 09/05/2015  . Genital herpes 08/09/2015  . Rh negative state in antepartum period 11/09/2013  . Abnormal Pap smear of cervix 04/06/2013    Assessment: Kristina [name] is a 26 y.o. 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

## 2016-05-30 NOTE — Progress Notes (Signed)
HPI: Kristina BetterJanie R [name] is a 26 y.o. year old 574P2012 female at 531w4d weeks gestation who presents to MAU reporting Spontaneous rupture of membranes. Fern-positive per RN. Rare, mild contractions. Asked to verify presentation by ultrasound. Informal ultrasound shows vertex presentation. Admit to labor and delivery. Philipp DeputyKim Shaw, CNM assumes care of patient.  [place] Lerline Valdivia, CNM 05/30/2016 2:35 AM

## 2016-05-30 NOTE — Progress Notes (Signed)
Patient ID: Kristina [name], female   DOB: Mar 16, 1990, 26 y.o.   MRN: [id number]  Feeling well, some cramping; Pit just started  BP 129/81, 120/74, other VSS FHR 130s, +accels, no decels Irreg ctx w/ Pit @ 552mu/min Cx deferred (was 3cm in office)  IUP@term  PROM x 7+ hrs GBS neg  Continue to increase Pit to achieve reg ctx Anticipate SVD  Cam HaiSHAW, KIMBERLY CNM 05/30/2016 7:48 AM

## 2016-05-30 NOTE — MAU Note (Addendum)
PT  SAYS SROM  AT   0030-  WHILE  GETTING  READY  FOR  BED.    PNC-  [facility].   VE  3 CM  ON  Monday.      HAS HX OF HSV- LAST OUTBREAK  - 06-2015-  DENIES ANY S/S NOW- TAKES  VAL[facility]REX.       DENIES MRSA.  GBS- NEG  FEELS  SOME  CRAMPS.

## 2016-05-30 NOTE — Anesthesia Pain Management Evaluation Note (Signed)
  CRNA Pain Management Visit Note  Patient: Kristina [name], 26 y.o., female  "Hello I am a member of the anesthesia team at [facility]. We have an anesthesia team available at all times to provide care throughout the hospital, including epidural management and anesthesia for C-section. I don't know your plan for the delivery whether it a natural birth, water birth, IV sedation, nitrous supplementation, doula or epidural, but we want to meet your pain goals."   1.Was your pain managed to your expectations on prior hospitalizations?   Yes   2.What is your expectation for pain management during this hospitalization?     Epidural  3.How can we help you reach that goal?   Record the patient's initial score and the patient's pain goal.   Pain: 6  Pain Goal: 8 [facility] wants you to be able to say your pain was always managed very well.  Laban EmperorMalinova,Crixus Mcaulay Hristova 05/30/2016

## 2016-05-30 NOTE — Progress Notes (Signed)
Kristina [name] Kristina [name] is a 26 y.o. [id number] at 4377w4d   Subjective: Patient says epidural not working well. Still feeling sharp pains. Has some nausea.  Objective: BP 116/78   Pulse (!) 104   Temp 98 F (36.7 C) (Oral)   Resp 20   Ht 5\' 7"  (1.702 m)   Wt 235 lb (106.6 kg)   LMP 08/27/2015 (Approximate)   BMI 36.81 kg/m  No intake/output data recorded. No intake/output data recorded.  FHT:  FHR: 130 bpm, variability: moderate,  accelerations:  Present,  decelerations:  Absent UC:   irregular, every 5-7 minutes SVE:   Dilation: 5 Effacement (%): 80 Station: -1 Exam by:: m wilkins rnc  Labs: Lab Results  Component Value Date   WBC 10.7 (H) 05/30/2016   HGB 11.4 (L) 05/30/2016   HCT 34.3 (L) 05/30/2016   MCV 83.3 05/30/2016   PLT 166 05/30/2016    Assessment / Plan: Spontaneous labor, progressing normally. S/p PROM since 0000.  Labor: Progressing on Pitocin, will titrate to achieve adequate labor Preeclampsia:  None Fetal Wellbeing:  Category I Pain Control:  Epidural, RN aware of ineffective epidural I/D:  GBS negative Anticipated MOD:  NSVD  Wendee Beaversavid J Ricci Dirocco, DO, PGY-1 05/30/2016, 9:24 AM

## 2016-05-30 NOTE — Plan of Care (Signed)
Problem: Urinary Elimination: Goal: Ability to reestablish a normal urinary elimination pattern will improve Outcome: Completed/Met Date Met: 05/30/16 Pt voiding post catheter removal without difficulty.

## 2016-05-31 DIAGNOSIS — Z3A39 39 weeks gestation of pregnancy: Secondary | ICD-10-CM

## 2016-05-31 DIAGNOSIS — O4202 Full-term premature rupture of membranes, onset of labor within 24 hours of rupture: Secondary | ICD-10-CM

## 2016-05-31 LAB — RPR: RPR Ser Ql: NONREACTIVE

## 2016-05-31 MED ORDER — RHO D IMMUNE GLOBULIN 1500 UNIT/2ML IJ SOSY
300.0000 ug | PREFILLED_SYRINGE | Freq: Once | INTRAMUSCULAR | Status: DC
  Filled 2016-05-31: qty 2

## 2016-05-31 NOTE — Progress Notes (Signed)
Post Partum Day 1 Subjective: no complaints, up ad lib, voiding and tolerating PO  Objective: Blood pressure 113/61, pulse 67, temperature 98 F (36.7 C), temperature source Oral, resp. rate 18, height 5\' 7"  (1.702 m), weight 106.6 kg (235 lb), last menstrual period 08/27/2015, SpO2 98 %, unknown if currently breastfeeding.  Physical Exam:  General: alert, cooperative and no distress Lochia: appropriate Uterine Fundus: firm Incision: none DVT Evaluation: No evidence of DVT seen on physical exam.   Recent Labs  05/30/16 0245  HGB 11.4*  HCT 34.3*    Assessment/Plan: Plan for discharge tomorrow. Would like Nuvaring for contraception.   LOS: 1 day   Melburn PopperJames Kirby  PA Student  05/31/2016, 7:26 AM   I confirm that I have verified the information documented in the physician assistant's note and that I have also personally reperformed the physical exam and all medical decision making activities.

## 2016-05-31 NOTE — Progress Notes (Signed)
CSW acknowledges consult.  CSW attempted to meet with MOB, however MOB had several room guest.  CSW will attempt to visit with MOB at a later time.   Levada Bowersox Boyd-Gilyard, MSW, LCSW Clinical Social Work [phone]  

## 2016-05-31 NOTE — Anesthesia Postprocedure Evaluation (Signed)
Anesthesia Post Note  Patient: Kristina [name]  Procedure(s) Performed: * No procedures listed *  Patient location during evaluation: Mother Baby Anesthesia Type: Epidural Level of consciousness: awake and alert, oriented and patient cooperative Pain management: pain level controlled Vital Signs Assessment: post-procedure vital signs reviewed and stable Respiratory status: spontaneous breathing Cardiovascular status: stable Postop Assessment: no headache, epidural receding, patient able to bend at knees and no signs of nausea or vomiting Anesthetic complications: no Comments: Pain score 0.        Last Vitals:  Vitals:   05/31/16 0230 05/31/16 0553  BP: 112/76 113/61  Pulse: 96 67  Resp: 18 18  Temp: 36.5 C 36.7 C    Last Pain:  Vitals:   05/31/16 0624  TempSrc:   PainSc: 0-No pain   Pain Goal:                 [facility] HospitalWRINKLE,[name]

## 2016-06-01 MED ORDER — IBUPROFEN 600 MG PO TABS: 600.0000 mg | ORAL_TABLET | Freq: Four times a day (QID) | ORAL | 0 refills | Status: DC

## 2016-06-01 NOTE — Clinical Social Work Maternal (Signed)
  CLINICAL SOCIAL WORK MATERNAL/CHILD NOTE  Patient Details  Name: Kristina [name] MRN: [id number] Date of Birth: 12/26/1990  Date:  06/01/2016  Clinical Social Worker Initiating Note:  Hanley Benhanelle Dymond Gutt, MSW, LCSW-A  Date/ Time Initiated:  06/01/16/1621     Child's Name:  Kristina [name]   Legal Guardian:  Kristina [name]   Need for Interpreter:  None   Date of Referral:  05/30/16     Reason for Referral:  Other (Comment) (MOB hx of depression )   [facility]   Address:  [address] [place],  [place] [address]  Phone number:  [phone]   Household Members:  Self, Spouse   Natural Supports (not living in the home):  Friends, Extended Family, Immediate Family   Professional Supports: None   Employment: Other (comment)   Type of Work: Unknown to Conservation officer, naturethis writer at this time.    Education:  9 to 11 years   Financial Resources:  Medicaid   Other Resources:      Cultural/Religious Considerations Which May Impact Care:  None reported.   Strengths:  Ability to meet basic needs , Compliance with medical plan , Pediatrician chosen , Home prepared for child    Risk Factors/Current Problems:  Other (Comment) (Hx of depression )   Cognitive State:  Alert , Able to Concentrate , Goal Oriented , Insightful    Mood/Affect:  Calm , Comfortable , Interested    CSW Assessment: CSW completed assessment with MOB over the phone for consult regarding hx of depression. MOB was pleasant and willing to discuss with CSW hx of depression. MOB notes she experienced minor depression during her pregnancy at which time she was prescribed an anti-depressant. CSW inquired if this was a new dx during her pregnancy. MOB noted she had a hx of it prior to pregnancy; however has always been able to manage it. She notes this was the first time she ever to be put on medication for it. CSW inquired if MOB felt medications worked well and if she wanted to continue taking them. MOB notes she does not  feel the need to continue taking them and has no desire to. MOB further noted that she has a great support system for she and baby. CSW educated MOB on PPD and safe sleeping/SIDS. MOB verbalized understanding. This Clinical research associatewriter also informed MOB that if she ever begins to feel PPD symptoms onset to seek medical attention. MOB verbalized understanding. At this time, this writer has no further questions and/or concerns. Case closed to this CSW.   CSW Plan/Description:  No Further Intervention Required/No Barriers to Discharge, Information/Referral to [facility] , Patient/Family Education     Eaton CorporationChanelle Roise Emert, MSW, Kristina SupplyLCSW-A Clinical Social Worker   [facility]  Office: [phone]

## 2016-06-01 NOTE — Discharge Summary (Signed)
OB Discharge Summary     Patient Name: Kristina [name] DOB: [birth year] MRN: [id number]  Date of admission: 05/30/2016 Delivering MD: Wendee BeaversMCMULLEN, DAVID J   Date of discharge: 06/01/2016  Admitting diagnosis: 39 WEEKS POSSIBLE ROM Intrauterine pregnancy: 5369w4d     Secondary diagnosis:  Active Problems:   Full-term premature rupture of membranes  Additional problems: HSV     Discharge diagnosis: Term Pregnancy Delivered                                                                                                Post partum procedures:None  Augmentation: Pitocin  Complications: None  Hospital course:  Induction of Labor With Vaginal Delivery   26 y.o. yo [id number] at 7669w4d was admitted to the hospital 05/30/2016 for induction of labor.  Indication for induction: PROM.  Patient had an uncomplicated labor course as follows: Membrane Rupture Time/Date: 11:45 PM ,05/29/2016   Intrapartum Procedures: Episiotomy: None [1]                                         Lacerations:  None [1]  Patient had delivery of a Viable infant.  Information for the patient's newborn:  Kristina [name], Boy Season [[id number]][[id number]]  Delivery Method: Vaginal, Spontaneous Delivery (Filed from Delivery Summary)   05/30/2016  Details of delivery can be found in separate delivery note.  Patient had a routine postpartum course. Patient is discharged home 06/01/16.  Physical exam  Vitals:   05/31/16 0230 05/31/16 0553 05/31/16 1748 06/01/16 0534  BP: 112/76 113/61 122/74 (!) 90/52  Pulse: 96 67 85 73  Resp: 18 18 16 16   Temp: 97.7 F (36.5 C) 98 F (36.7 C) 98.3 F (36.8 C) 98.5 F (36.9 C)  TempSrc: Oral Oral Oral Oral  SpO2: 98%  99%   Weight:      Height:       General: alert, cooperative and no distress Lochia: appropriate Uterine Fundus: firm Incision: N/A DVT Evaluation: No evidence of DVT seen on physical exam. Labs: Lab Results  Component Value Date   WBC 10.7 (H) 05/30/2016   HGB 11.4 (L) 05/30/2016   HCT 34.3 (L) 05/30/2016   MCV 83.3 05/30/2016   PLT 166 05/30/2016   CMP Latest Ref Rng & Units 11/30/2015  Glucose 65 - 99 mg/dL 98  BUN 6 - 20 mg/dL <9(G<5(L)  Creatinine 2.950.44 - 1.00 mg/dL 2.840.64  Sodium 132135 - 440145 mmol/L 136  Potassium 3.5 - 5.1 mmol/L 3.1(L)  Chloride 101 - 111 mmol/L 108  CO2 22 - 32 mmol/L 22  Calcium 8.9 - 10.3 mg/dL 8.3(L)  Total Protein 6.5 - 8.1 g/dL -  Total Bilirubin 0.3 - 1.2 mg/dL -  Alkaline Phos 38 - 102126 U/L -  AST 15 - 41 U/L -  ALT 14 - 54 U/L -    Discharge instruction: per After Visit Summary and "Baby and Me Booklet".  After visit meds:  Allergies as of 06/01/2016   No Known Allergies  Medication List    STOP taking these medications   acyclovir 400 MG tablet Commonly known as:  ZOVIRAX     TAKE these medications   acetaminophen 325 MG tablet Commonly known as:  TYLENOL Take 650 mg by mouth every 6 (six) hours as needed for mild pain or headache.   ferrous sulfate 325 (65 FE) MG tablet Take 1 tablet (325 mg total) by mouth 2 (two) times daily with a meal.   ibuprofen 600 MG tablet Commonly known as:  ADVIL,MOTRIN Take 1 tablet (600 mg total) by mouth every 6 (six) hours.   PRENATAL VITAMIN PO Take 1 tablet by mouth at bedtime.        Diet: routine diet  Activity: Advance as tolerated. Pelvic rest for 6 weeks.   Outpatient follow up:6 weeks Follow up Appt: Future Appointments Date Time Provider Department Center  06/04/2016 10:45 AM Lazaro Arms, MD [facility]   Follow up Visit:No Follow-up on file.  Postpartum contraception: Nuvaring  Newborn Data: Live born female  Birth Weight: 7 lb 8 oz (3402 g) APGAR: 8, 9  Baby Feeding: Bottle Disposition:home with mother   06/01/2016 Gwenevere Abbot, MD   CNM attestation I have seen and examined this patient and agree with above documentation in the resident's note.   Kristina [name] is a 26 y.o. [phone] s/p SVD.   Pain is well controlled.  Plan for birth  control is NuvaRing vaginal inserts.  Method of Feeding: bottle  PE:  BP (!) 90/52 (BP Location: Left Arm) Comment: mom lying on side sleeping  Pulse 73   Temp 98.5 F (36.9 C) (Oral)   Resp 16   Ht 5\' 7"  (1.702 m)   Wt 106.6 kg (235 lb)   LMP 08/27/2015 (Approximate)   SpO2 99%   Breastfeeding? Unknown   BMI 36.81 kg/m  Fundus firm   Recent Labs  05/30/16 0245  HGB 11.4*  HCT 34.3*     Plan: discharge today - postpartum care discussed - f/u clinic in 4-6 weeks for postpartum visit   Cam Hai, CNM 9:35 AM  06/01/2016

## 2016-06-03 ENCOUNTER — Encounter: Payer: Self-pay | Admitting: Women's Health

## 2016-06-04 ENCOUNTER — Encounter: Payer: Medicaid Other | Admitting: Obstetrics & Gynecology

## 2016-06-13 ENCOUNTER — Ambulatory Visit: Payer: Medicaid Other | Admitting: Advanced Practice Midwife

## 2016-06-14 ENCOUNTER — Emergency Department (HOSPITAL_COMMUNITY): Payer: Medicaid Other

## 2016-06-14 ENCOUNTER — Emergency Department (HOSPITAL_COMMUNITY)
Admission: EM | Admit: 2016-06-14 | Discharge: 2016-06-14 | Disposition: A | Discharge status: Home / Self Care | Payer: Medicaid Other | Attending: Emergency Medicine | Admitting: Emergency Medicine

## 2016-06-14 ENCOUNTER — Encounter (HOSPITAL_COMMUNITY): Payer: Self-pay

## 2016-06-14 DIAGNOSIS — R1031 Right lower quadrant pain: Secondary | ICD-10-CM | POA: Diagnosis not present

## 2016-06-14 DIAGNOSIS — R300 Dysuria: Secondary | ICD-10-CM | POA: Diagnosis not present

## 2016-06-14 DIAGNOSIS — M549 Dorsalgia, unspecified: Secondary | ICD-10-CM | POA: Diagnosis not present

## 2016-06-14 DIAGNOSIS — O9989 Other specified diseases and conditions complicating pregnancy, childbirth and the puerperium: Secondary | ICD-10-CM | POA: Insufficient documentation

## 2016-06-14 DIAGNOSIS — R109 Unspecified abdominal pain: Secondary | ICD-10-CM

## 2016-06-14 DIAGNOSIS — O0289 Other abnormal products of conception: Secondary | ICD-10-CM | POA: Diagnosis not present

## 2016-06-14 DIAGNOSIS — Z87891 Personal history of nicotine dependence: Secondary | ICD-10-CM | POA: Insufficient documentation

## 2016-06-14 HISTORY — DX: Urinary tract infection, site not specified: N39.0

## 2016-06-14 LAB — CBC
HEMATOCRIT: 42.5 % (ref 36.0–46.0)
Hemoglobin: 13.8 g/dL (ref 12.0–15.0)
MCH: 27.5 pg (ref 26.0–34.0)
MCHC: 32.5 g/dL (ref 30.0–36.0)
MCV: 84.7 fL (ref 78.0–100.0)
PLATELETS: 216 10*3/uL (ref 150–400)
RBC: 5.02 MIL/uL (ref 3.87–5.11)
RDW: 14.6 % (ref 11.5–15.5)
WBC: 7.4 10*3/uL (ref 4.0–10.5)

## 2016-06-14 LAB — URINALYSIS, ROUTINE W REFLEX MICROSCOPIC
BILIRUBIN URINE: NEGATIVE
Bacteria, UA: NONE SEEN
Glucose, UA: NEGATIVE mg/dL
Ketones, ur: NEGATIVE mg/dL
NITRITE: NEGATIVE
PH: 6 (ref 5.0–8.0)
Protein, ur: NEGATIVE mg/dL
SPECIFIC GRAVITY, URINE: 1.016 (ref 1.005–1.030)

## 2016-06-14 LAB — COMPREHENSIVE METABOLIC PANEL
ALT: 36 U/L (ref 14–54)
AST: 35 U/L (ref 15–41)
Albumin: 4 g/dL (ref 3.5–5.0)
Alkaline Phosphatase: 104 U/L (ref 38–126)
Anion gap: 10 (ref 5–15)
BILIRUBIN TOTAL: 0.6 mg/dL (ref 0.3–1.2)
BUN: 13 mg/dL (ref 6–20)
CO2: 26 mmol/L (ref 22–32)
CREATININE: 0.73 mg/dL (ref 0.44–1.00)
Calcium: 9.5 mg/dL (ref 8.9–10.3)
Chloride: 104 mmol/L (ref 101–111)
GFR calc Af Amer: >60 mL/min (ref >60)
Glucose, Bld: 85 mg/dL (ref 65–99)
POTASSIUM: 3.7 mmol/L (ref 3.5–5.1)
Sodium: 140 mmol/L (ref 135–145)
TOTAL PROTEIN: 7.6 g/dL (ref 6.5–8.1)

## 2016-06-14 LAB — PREGNANCY, URINE: PREG TEST UR: POSITIVE — AB

## 2016-06-14 LAB — HCG, QUANTITATIVE, PREGNANCY: HCG, BETA CHAIN, QUANT, S: 15 m[IU]/mL — AB (ref <5)

## 2016-06-14 LAB — LIPASE, BLOOD: Lipase: 41 U/L (ref 11–51)

## 2016-06-14 MED ORDER — MORPHINE SULFATE (PF) 4 MG/ML IV SOLN
4.0000 mg | Freq: Once | INTRAVENOUS | Status: AC
  Administered 2016-06-14: 4 mg via INTRAVENOUS
  Filled 2016-06-14: qty 1

## 2016-06-14 MED ORDER — HYDROCODONE-ACETAMINOPHEN 5-325 MG PO TABS: 1.0000 | ORAL_TABLET | Freq: Four times a day (QID) | ORAL | 0 refills | Status: DC | PRN

## 2016-06-14 NOTE — ED Triage Notes (Signed)
Pt reports that she has been experiencing right lower abdominal pain which radiates to her back x 4 days.pain is intermittent, currently no pain now She went to urgent care 2 days ago and was dx with UTI. And is currently on antibiotics. Pt delivered baby on 05/30/16

## 2016-06-14 NOTE — ED Provider Notes (Signed)
[facility]-EM[facility] DEPT Provider Note   CSN: [id number] Arrival date & time: 06/14/16  1217  By signing my name below, I, Kristina [name], attest that this documentation has been prepared under the direction and in the presence of Doug Sou, MD. Electronically Signed: Modena [name], Scribe. 06/14/2016. 12:36 PM.  History   Chief Complaint Chief Complaint  Patient presents with  . Abdominal Pain   The history is provided by the patient. No language interpreter was used.   HPI Comments: Kristina [name] is a 26 y.o. female who presents to the Emergency Department complaining of intermittent moderate RLQ pain that started about 4 days ago.Pain radiates to right flank She delivered her 3rd baby on 05/30/16. She went to an Urgent Care, was diagnosed with a UTI, and told to return if symptoms persisted after 24 hours from taking prescribed Macrobid. No pain treatment PTA. No nausea or vomiting no anorexia nothing makes symptoms better or worse no other associated symptoms present asymptomatic Her pain comes on sporadically for about 5-10 minutes. No modifying factors. She describes the pain as stabbing sensation radiating to her back. She reports associated dysuria. Her last BM was yesterday and it was normal. Denies any current hx of kidney stones, fever, decreased appetite, vomiting, vaginal discharge/bleeding, or other complaints at this time. Postpartum 2 weeks ago     [facility]  Past Medical History:  Diagnosis Date  . Anemia   . Herpes   . Miscarriage   . Rh negative state in antepartum period   . Urinary tract infection   . Vaginal Pap smear, abnormal     Patient Active Problem List   Diagnosis Date Noted  . Full-term premature rupture of membranes 05/30/2016  . Irregular fetal heart rate 02/14/2016  . Dehydration, moderate 11/28/2015  . Gastroenteritis 11/28/2015  . Hypokalemia 11/28/2015  . Pregnancy 11/28/2015  . Viral gastroenteritis 11/28/2015   . Depression 11/21/2015  . First trimester bleeding 11/21/2015  . Left ovarian cyst 10/24/2015  . Frequent headaches 09/05/2015  . Obese 09/05/2015  . Genital herpes 08/09/2015  . Rh negative state in antepartum period 11/09/2013  . Abnormal Pap smear of cervix 04/06/2013    Past Surgical History:  Procedure Laterality Date  . CHOLECYSTECTOMY N/A 01/04/2015   Procedure: LAPAROSCOPIC CHOLECYSTECTOMY;  Surgeon: Franky Macho, MD;  Location: [facility];  Service: General;  Laterality: N/A;  . TONSILLECTOMY      OB History    Gravida Para Term Preterm AB Living   SAB TAB Ectopic Multiple Live Births   1     0 3       Home Medications    Prior to Admission medications   Medication Sig Start Date End Date Taking? Authorizing Provider  acetaminophen (TYLENOL) 325 MG tablet Take 650 mg by mouth every 6 (six) hours as needed for mild pain or headache.     Historical Provider, MD  ferrous sulfate 325 (65 FE) MG tablet Take 1 tablet (325 mg total) by mouth 2 (two) times daily with a meal. 04/05/16   Cheral Marker, CNM  ibuprofen (ADVIL,MOTRIN) 600 MG tablet Take 1 tablet (600 mg total) by mouth every 6 (six) hours. 06/01/16   Arabella Merles, CNM  Prenatal Vit-Fe Fumarate-FA (PRENATAL VITAMIN PO) Take 1 tablet by mouth at bedtime.     Historical Provider, MD    Family History Family History  Problem Relation Age of Onset  .  Cancer Maternal Grandmother     breast  . Diabetes Paternal Grandfather   . Jaundice Son     Social History Social History  Substance Use Topics  . Smoking status: Former Smoker    Packs/day: 0.50    Years: 9.00    Types: Cigarettes    Quit date: 01/01/2013  . Smokeless tobacco: Never Used  . Alcohol use No     Comment: occasional     Allergies   Patient has no known allergies.   Review of Systems Review of Systems  Constitutional: Negative.  Negative for appetite change and fever.  HENT: Negative.   Respiratory: Negative.     Cardiovascular: Negative.   Gastrointestinal: Positive for abdominal pain. Negative for vomiting.  Genitourinary: Positive for dysuria. Negative for vaginal bleeding and vaginal discharge.       2 weeks postpartum. Not breast-feeding  Musculoskeletal: Positive for back pain.  Skin: Negative.   Neurological: Negative.   Psychiatric/Behavioral: Negative.   All other systems reviewed and are negative.    Physical Exam Updated Vital Signs BP 127/77 (BP Location: Right Arm)   Pulse 79   Temp 97.8 F (36.6 C) (Oral)   Resp 18   Ht  (1.727 m)   Wt 217 lb (98.4 kg)   SpO2 100%   BMI 32.99 kg/m   Physical Exam  Constitutional: She appears well-developed and well-nourished.  HENT:  Head: Normocephalic and atraumatic.  Eyes: Conjunctivae are normal. Pupils are equal, round, and reactive to light.  Neck: Neck supple. No tracheal deviation present. No thyromegaly present.  Cardiovascular: Normal rate and regular rhythm.   No murmur heard. Pulmonary/Chest: Effort normal and breath sounds normal.  Abdominal: Soft. Bowel sounds are normal. She exhibits no distension and no mass. There is tenderness. There is no rebound and no guarding. No hernia.  Minimally tender to RLQ.   Genitourinary:  Genitourinary Comments: No flank tenderness  Musculoskeletal: Normal range of motion. She exhibits no edema or tenderness.  Neurological: She is alert. Coordination normal.  Skin: Skin is warm and dry. No rash noted.  Psychiatric: She has a normal mood and affect.  Nursing note and vitals reviewed.    ED Treatments / Results  DIAGNOSTIC STUDIES: Oxygen Saturation is 100% on RA, normal by my interpretation.    COORDINATION OF CARE: 12:40 PM- Pt advised of plan for treatment and pt agrees.  Labs (all labs ordered are listed, but only abnormal results are displayed) Labs Reviewed  LIPASE, BLOOD  COMPREHENSIVE METABOLIC PANEL  CBC  URINALYSIS, ROUTINE W REFLEX MICROSCOPIC    EKG   EKG Interpretation None       Radiology No results found.  Procedures Procedures (including critical care time)  Medications Ordered in ED Medications - No data to display  Results for orders placed or performed during the hospital encounter of 06/14/16  Lipase, blood  Result Value Ref Range   Lipase 41 11 - 51 U/L  Comprehensive metabolic panel  Result Value Ref Range   Sodium 140 135 - 145 mmol/L   Potassium 3.7 3.5 - 5.1 mmol/L   Chloride 104 101 - 111 mmol/L   CO2 26 22 - 32 mmol/L   Glucose, Bld 85 65 - 99 mg/dL   BUN 13 6 - 20 mg/dL   Creatinine, Ser 1.61 0.44 - 1.00 mg/dL   Calcium 9.5 8.9 - 09.6 mg/dL   Total Protein 7.6 6.5 - 8.1 g/dL   Albumin 4.0 3.5 - 5.0 g/dL  AST 35 15 - 41 U/L   ALT 36 14 - 54 U/L   Alkaline Phosphatase 104 38 - 126 U/L   Total Bilirubin 0.6 0.3 - 1.2 mg/dL   GFR calc non Af Amer >60 >60 mL/min   GFR calc Af Amer >60 >60 mL/min   Anion gap 10 5 - 15  CBC  Result Value Ref Range   WBC 7.4 4.0 - 10.5 K/uL   RBC 5.02 3.87 - 5.11 MIL/uL   Hemoglobin 13.8 12.0 - 15.0 g/dL   HCT 96.0 45.4 - 09.8 %   MCV 84.7 78.0 - 100.0 fL   MCH 27.5 26.0 - 34.0 pg   MCHC 32.5 30.0 - 36.0 g/dL   RDW 11.9 14.7 - 82.9 %   Platelets 216 150 - 400 K/uL  Urinalysis, Routine w reflex microscopic  Result Value Ref Range   Color, Urine YELLOW YELLOW   APPearance CLEAR CLEAR   Specific Gravity, Urine 1.016 1.005 - 1.030   pH 6.0 5.0 - 8.0   Glucose, UA NEGATIVE NEGATIVE mg/dL   Hgb urine dipstick MODERATE (A) NEGATIVE   Bilirubin Urine NEGATIVE NEGATIVE   Ketones, ur NEGATIVE NEGATIVE mg/dL   Protein, ur NEGATIVE NEGATIVE mg/dL   Nitrite NEGATIVE NEGATIVE   Leukocytes, UA TRACE (A) NEGATIVE   RBC / HPF 6-30 0 - 5 RBC/hpf   WBC, UA 0-5 0 - 5 WBC/hpf   Bacteria, UA NONE SEEN NONE SEEN   Squamous Epithelial / LPF 0-5 (A) NONE SEEN  Pregnancy, urine  Result Value Ref Range   Preg Test, Ur POSITIVE (A) NEGATIVE  hCG, quantitative, pregnancy  Result  Value Ref Range   hCG, Beta Chain, Quant, S 15 (H) <5 mIU/mL   US Renal  Result Date: 06/14/2016 CLINICAL DATA:  Patient with right flank pain radiating to the back. EXAM: RENAL / URINARY TRACT ULTRASOUND COMPLETE COMPARISON:  Pelvic ultrasound 01/17/2016 FINDINGS: Right Kidney: Length: 13.5 cm. Echogenicity within normal limits. No mass or hydronephrosis visualized. Left Kidney: Length: 13.5 cm. Echogenicity within normal limits. No mass or hydronephrosis visualized. Bladder: Appears normal for degree of bladder distention. Incidental note is made of a 5.2 cm left ovarian cyst. Increased echogenicity of the hepatic parenchyma. IMPRESSION: No hydronephrosis. Findings suggestive of hepatic steatosis. 5.2 cm left ovarian cyst. Recommend follow-up pelvic ultrasound in 12 months. Electronically Signed   By: Annia Belt M.D.   On: 06/14/2016 15:23   Initial Impression / Assessment and Plan / ED Course  I have reviewed the triage vital signs and the nursing notes.  Pertinent labs & imaging results that were available during my care of the patient were reviewed by me and considered in my medical decision making (see chart for details).     1 PM patient complaining of abdominal pain. writhing on bed. IV Morphine ordered 1:30 PM pain improved . 4:30 PM patient asymptomatic. Feels ready to go home. Case discussed with Dr.Eure plan prescription Norco. Advil for mild pain. Pain is suggestive of ureteral colic. [facility] with [facility] continues by next week. Minimally elevated quantitative hCG likely result patient being 2 weeks postpartum  [place] Controlled Substance reporting System queried   Final Clinical Impressions(s) / ED Diagnoses   #1 right flank pain  #2 microscopic hematuria  Final diagnoses:  None    New Prescriptions New Prescriptions   No medications on file   I personally performed the services described in this documentation, which was scribed in my presence. The  recorded information has been reviewed and considered.     Doug Sou, MD 06/14/16 [phone]

## 2016-06-14 NOTE — Discharge Instructions (Signed)
Take Advil for mild pain or the pain medicine prescribed for bad pain. Call Dr.Eure's office to be seen if continuing to have significant pain by next week. You should also have your urine rechecked for blood within the next month. Dr. Forestine Chute office can do that for you. You have trace microscopic amount of blood in your urine today. Return if your pain is not well controlled, if he developed fever, uncontrolled vomiting or our concern for any reason

## 2016-06-14 NOTE — ED Notes (Signed)
Patient transported to CT 

## 2016-06-14 NOTE — ED Notes (Signed)
Patient transported to Ultrasound 

## 2016-07-04 ENCOUNTER — Ambulatory Visit (INDEPENDENT_AMBULATORY_CARE_PROVIDER_SITE_OTHER): Payer: Medicaid Other | Admitting: Advanced Practice Midwife

## 2016-07-04 ENCOUNTER — Encounter: Payer: Self-pay | Admitting: Advanced Practice Midwife

## 2016-07-04 NOTE — Progress Notes (Signed)
Kristina [name] is a 26 y.o. who presents for a postpartum visit. She is 4 weeks postpartum following a spontaneous vaginal delivery. I have fully reviewed the prenatal and intrapartum course. The delivery was at 39.1 gestational weeks.  Anesthesia: epidural. Postpartum course has been uneventful. Baby's course has been uneventful. Baby is feeding by bottle. Bleeding: no bleeding. Bowel function is normal. Bladder function is normal. Patient is sexually active. Contraception method is none. Postpartum depression screening: negative.   Current Outpatient Prescriptions:  .  acetaminophen (TYLENOL) 325 MG tablet, Take 650 mg by mouth every 6 (six) hours as needed for mild pain or headache. , Disp: , Rfl:  .  ferrous sulfate 325 (65 FE) MG tablet, Take 1 tablet (325 mg total) by mouth 2 (two) times daily with a meal. (Patient not taking: Reported on 06/14/2016), Disp: 60 tablet, Rfl: 3 .  HYDROcodone-acetaminophen (NORCO) 5-325 MG tablet, Take 1-2 tablets by mouth every 6 (six) hours as needed for severe pain. (Patient not taking: Reported on 07/04/2016), Disp: 16 tablet, Rfl: 0 .  ibuprofen (ADVIL,MOTRIN) 600 MG tablet, Take 1 tablet (600 mg total) by mouth every 6 (six) hours. (Patient not taking: Reported on 06/14/2016), Disp: 30 tablet, Rfl: 0 .  nitrofurantoin, macrocrystal-monohydrate, (MACROBID) 100 MG capsule, Take 100 mg by mouth 2 (two) times daily., Disp: , Rfl:   Review of Systems   Constitutional: Negative for fever and chills Eyes: Negative for visual disturbances Respiratory: Negative for shortness of breath, dyspnea Cardiovascular: Negative for chest pain or palpitations  Gastrointestinal: Negative for vomiting, diarrhea and constipation Genitourinary: Negative for dysuria and urgency Musculoskeletal: Negative for back pain, joint pain, myalgias  Neurological: Negative for dizziness and headaches    Objective:     Vitals:   07/04/16 1040  BP: 120/70  Pulse: 74   General:  alert,  cooperative and no distress   Breasts:  negative  Lungs: clear to auscultation bilaterally  Heart:  regular rate and rhythm  Abdomen: Soft, nontender   Vulva:  normal  Vagina: normal vagina  Cervix:  closed  Corpus: Well involuted     Rectal Exam: no hemorrhoids        Assessment:    normal postpartum exam.  Plan:   1. Contraception: tubal ligation 2. Follow up in:  preop for BTL or as needed.

## 2016-07-09 ENCOUNTER — Encounter: Payer: Self-pay | Admitting: Obstetrics & Gynecology

## 2016-07-09 ENCOUNTER — Ambulatory Visit (INDEPENDENT_AMBULATORY_CARE_PROVIDER_SITE_OTHER): Payer: Medicaid Other | Admitting: Obstetrics & Gynecology

## 2016-07-09 VITALS — BP 112/70 | HR 74 | Wt 226.0 lb

## 2016-07-09 DIAGNOSIS — Z01818 Encounter for other preprocedural examination: Secondary | ICD-10-CM

## 2016-07-09 DIAGNOSIS — Z3009 Encounter for other general counseling and advice on contraception: Secondary | ICD-10-CM

## 2016-07-09 NOTE — Progress Notes (Signed)
Preoperative History and Physical  Kristina [name] is a 26 y.o. [phone] with Patient's last menstrual period was 07/07/2016. admitted for a laparoscopic tubal sterilization: electrocautery method. Pt desires ideally bilateral salpingectomy however Medicaid is not currently reimbursing for this and patient declines to pay for it out of pocket.   PMH:    Past Medical History:  Diagnosis Date  . Anemia   . Herpes   . Miscarriage   . Rh negative state in antepartum period   . Urinary tract infection   . Vaginal Pap smear, abnormal     PSH:     Past Surgical History:  Procedure Laterality Date  . CHOLECYSTECTOMY N/A 01/04/2015   Procedure: LAPAROSCOPIC CHOLECYSTECTOMY;  Surgeon: Franky Macho, MD;  Location: [facility];  Service: General;  Laterality: N/A;  . TONSILLECTOMY      POb/GynH:      OB History    Gravida Para Term Preterm AB Living   SAB TAB Ectopic Multiple Live Births   1     0 3      SH:   Social History  Substance Use Topics  . Smoking status: Former Smoker    Packs/day: 0.50    Years: 9.00    Types: Cigarettes    Quit date: 01/01/2013  . Smokeless tobacco: Never Used  . Alcohol use No     Comment: occasional    FH:    Family History  Problem Relation Age of Onset  . Cancer Maternal Grandmother     breast  . Diabetes Paternal Grandfather   . Jaundice Son      Allergies: No Known Allergies  Medications:      No current outpatient prescriptions on file.  Review of Systems:   Review of Systems  Constitutional: Negative for fever, chills, weight loss, malaise/fatigue and diaphoresis.  HENT: Negative for hearing loss, ear pain, nosebleeds, congestion, sore throat, neck pain, tinnitus and ear discharge.   Eyes: Negative for blurred vision, double vision, photophobia, pain, discharge and redness.  Respiratory: Negative for cough, hemoptysis, sputum production, shortness of breath, wheezing and stridor.   Cardiovascular: Negative for chest  pain, palpitations, orthopnea, claudication, leg swelling and PND.  Gastrointestinal: Positive for abdominal pain. Negative for heartburn, nausea, vomiting, diarrhea, constipation, blood in stool and melena.  Genitourinary: Negative for dysuria, urgency, frequency, hematuria and flank pain.  Musculoskeletal: Negative for myalgias, back pain, joint pain and falls.  Skin: Negative for itching and rash.  Neurological: Negative for dizziness, tingling, tremors, sensory change, speech change, focal weakness, seizures, loss of consciousness, weakness and headaches.  Endo/Heme/Allergies: Negative for environmental allergies and polydipsia. Does not bruise/bleed easily.  Psychiatric/Behavioral: Negative for depression, suicidal ideas, hallucinations, memory loss and substance abuse. The patient is not nervous/anxious and does not have insomnia.      PHYSICAL EXAM:  Blood pressure 112/70, pulse 74, weight 226 lb (102.5 kg), last menstrual period 07/07/2016, unknown if currently breastfeeding.    Vitals reviewed. Constitutional: She is oriented to person, place, and time. She appears well-developed and well-nourished.  HENT:  Head: Normocephalic and atraumatic.  Right Ear: External ear normal.  Left Ear: External ear normal.  Nose: Nose normal.  Mouth/Throat: Oropharynx is clear and moist.  Eyes: Conjunctivae and EOM are normal. Pupils are equal, round, and reactive to light. Right eye exhibits no discharge. Left eye exhibits no discharge. No scleral icterus.  Neck: Normal range of motion. Neck supple. No tracheal deviation  present. No thyromegaly present.  Cardiovascular: Normal rate, regular rhythm, normal heart sounds and intact distal pulses.  Exam reveals no gallop and no friction rub.   No murmur heard. Respiratory: Effort normal and breath sounds normal. No respiratory distress. She has no wheezes. She has no rales. She exhibits no tenderness.  GI: Soft. Bowel sounds are normal. She  exhibits no distension and no mass. There is tenderness. There is no rebound and no guarding.  Genitourinary:       Vulva is normal without lesions Vagina is pink moist without discharge Cervix normal in appearance and pap is normal Uterus is normal size, contour, position, consistency, mobility, non-tender Adnexa is negative with normal sized ovaries by sonogram  Musculoskeletal: Normal range of motion. She exhibits no edema and no tenderness.  Neurological: She is alert and oriented to person, place, and time. She has normal reflexes. She displays normal reflexes. No cranial nerve deficit. She exhibits normal muscle tone. Coordination normal.  Skin: Skin is warm and dry. No rash noted. No erythema. No pallor.  Psychiatric: She has a normal mood and affect. Her behavior is normal. Judgment and thought content normal.    Labs: No results found for this or any previous visit (from the past 336 hour(s)).  EKG: No orders found for this or any previous visit.  Imaging Studies: US Renal  Result Date: 06/14/2016 CLINICAL DATA:  Patient with right flank pain radiating to the back. EXAM: RENAL / URINARY TRACT ULTRASOUND COMPLETE COMPARISON:  Pelvic ultrasound 01/17/2016 FINDINGS: Right Kidney: Length: 13.5 cm. Echogenicity within normal limits. No mass or hydronephrosis visualized. Left Kidney: Length: 13.5 cm. Echogenicity within normal limits. No mass or hydronephrosis visualized. Bladder: Appears normal for degree of bladder distention. Incidental note is made of a 5.2 cm left ovarian cyst. Increased echogenicity of the hepatic parenchyma. IMPRESSION: No hydronephrosis. Findings suggestive of hepatic steatosis. 5.2 cm left ovarian cyst. Recommend follow-up pelvic ultrasound in 12 months. Electronically Signed   By: Annia Belt M.D.   On: 06/14/2016 15:23      Assessment: [id number] female desires permanent sterilization: Since bilateral salpingectomy is not an option for meidcaid patients at  present, pt chooses electrocautery method All options discussed  Patient Active Problem List   Diagnosis Date Noted  . Full-term premature rupture of membranes 05/30/2016  . Irregular fetal heart rate 02/14/2016  . Dehydration, moderate 11/28/2015  . Gastroenteritis 11/28/2015  . Hypokalemia 11/28/2015  . Pregnancy 11/28/2015  . Viral gastroenteritis 11/28/2015  . Depression 11/21/2015  . First trimester bleeding 11/21/2015  . Left ovarian cyst 10/24/2015  . Frequent headaches 09/05/2015  . Obese 09/05/2015  . Genital herpes 08/09/2015  . Rh negative state in antepartum period 11/09/2013  . Abnormal Pap smear of cervix 04/06/2013    Plan: Laparoscopic bilateral sterilization: electrocautery method  Understands permanent nature of procedure as well as failure rate of 1/300  Gurtej Noyola H 07/09/2016 11:07 AM

## 2016-07-10 NOTE — Patient Instructions (Signed)
Kristina [name]  07/10/2016     @   Your procedure is scheduled on   07/17/2016   Report to [facility] at  700  A.M.  Call this number if you have problems the morning of surgery:  [phone]   Remember:  Do not eat food or drink liquids after midnight.  Take these medicines the morning of surgery with A SIP OF WATER  None   Do not wear jewelry, make-up or nail polish.  Do not wear lotions, powders, or perfumes, or deoderant.  Do not shave 48 hours prior to surgery.  Men may shave face and neck.  Do not bring valuables to the hospital.  [facility] is not responsible for any belongings or valuables.  Contacts, dentures or bridgework may not be worn into surgery.  Leave your suitcase in the car.  After surgery it may be brought to your room.  For patients admitted to the hospital, discharge time will be determined by your treatment team.  Patients discharged the day of surgery will not be allowed to drive home.   Name and phone number of your driver:   family Special instructions:  None  Please read over the following fact sheets that you were given. Anesthesia Post-op Instructions and Care and Recovery After Surgery       Laparoscopic Tubal Ligation Laparoscopic tubal ligation is a procedure to close the fallopian tubes. This is done so that you cannot get pregnant. When the fallopian tubes are closed, the eggs that your ovaries release cannot enter the uterus, and sperm cannot reach the released eggs. A laparoscopic tubal ligation is sometimes called "getting your tubes tied." You should not have this procedure if you want to get pregnant someday or if you are unsure about having more children. Tell a health care provider about:  Any allergies you have.  All medicines you are taking, including vitamins, herbs, eye drops, creams, and over-the-counter medicines.  Any problems you or family members have had with anesthetic  medicines.  Any blood disorders you have.  Any surgeries you have had.  Any medical conditions you have.  Whether you are pregnant or may be pregnant.  Any past pregnancies. What are the risks? Generally, this is a safe procedure. However, problems may occur, including:  Infection.  Bleeding.  Injury to surrounding organs.  Side effects from anesthetics.  Failure of the procedure. This procedure can increase your risk of a kind of pregnancy in which a fertilized egg attaches to the outside of the uterus (ectopic pregnancy). What happens before the procedure?  Ask your health care provider about:  Changing or stopping your regular medicines. This is especially important if you are taking diabetes medicines or blood thinners.  Taking medicines such as aspirin and ibuprofen. These medicines can thin your blood. Do not take these medicines before your procedure if your health care provider instructs you not to.  Follow instructions from your health care provider about eating and drinking restrictions.  Plan to have someone take you home after the procedure.  If you go home right after the procedure, plan to have someone with you for 24 hours. What happens during the procedure?  You will be given one or more of the following:  A medicine to help you relax (sedative).  A medicine to numb the area (local anesthetic).  A medicine to make you fall asleep (general anesthetic).  A medicine that is  injected into an area of your body to numb everything below the injection site (regional anesthetic).  An IV tube will be inserted into one of your veins. It will be used to give you medicines and fluids during the procedure.  Your bladder may be emptied with a small tube (catheter).  If you have been given a general anesthetic, a tube will be put down your throat to help you breathe.  Two small cuts (incisions) will be made in your lower abdomen and near your belly  button.  Your abdomen will be inflated with a gas. This will let the surgeon see better and will give the surgeon room to work.  A thin, lighted tube (laparoscope) with a camera attached will be inserted into your abdomen through one of the incisions. Small instruments will be inserted through the other incision.  The fallopian tubes will be tied off, burned (cauterized), or blocked with a clip, ring, or clamp. A small portion in the center of each fallopian tube may be removed.  The gas will be released from the abdomen.  The incisions will be closed with stitches (sutures).  A bandage (dressing) will be placed over the incisions. The procedure may vary among health care providers and hospitals. What happens after the procedure?  Your blood pressure, heart rate, breathing rate, and blood oxygen level will be monitored often until the medicines you were given have worn off.  You will be given medicine to help with pain, nausea, and vomiting as needed. This information is not intended to replace advice given to you by your health care provider. Make sure you discuss any questions you have with your health care provider. Document Released: 06/03/2000 Document Revised: 08/03/2015 Document Reviewed: 02/05/2015 Elsevier Interactive Patient Education  2017 [employer]  Laparoscopic Tubal Ligation, Care After Refer to this sheet in the next few weeks. These instructions provide you with information about caring for yourself after your procedure. Your health care provider may also give you more specific instructions. Your treatment has been planned according to current medical practices, but problems sometimes occur. Call your health care provider if you have any problems or questions after your procedure. What can I expect after the procedure? After the procedure, it is common to have:  A sore throat.  Discomfort in your shoulder.  Mild discomfort or cramping in your abdomen.  Gas  pains.  Pain or soreness in the area where the surgical cut (incision) was made.  A bloated feeling.  Tiredness.  Nausea.  Vomiting. Follow these instructions at home: Medicines   Take over-the-counter and prescription medicines only as told by your health care provider.  Do not take aspirin because it can cause bleeding.  Do not drive or operate heavy machinery while taking prescription pain medicine. Activity   Rest for the rest of the day.  Return to your normal activities as told by your health care provider. Ask your health care provider what activities are safe for you. Incision care    Follow instructions from your health care provider about how to take care of your incision. Make sure you:  Wash your hands with soap and water before you change your bandage (dressing). If soap and water are not available, use hand sanitizer.  Change your dressing as told by your health care provider.  Leave stitches (sutures) in place. They may need to stay in place for 2 weeks or longer.  Check your incision area every day for signs of infection. Check for:  More redness, swelling, or pain.  More fluid or blood.  Warmth.  Pus or a bad smell. Other Instructions   Do not take baths, swim, or use a hot tub until your health care provider approves. You may take showers.  Keep all follow-up visits as told by your health care provider. This is important.  Have someone help you with your daily household tasks for the first few days. Contact a health care provider if:  You have more redness, swelling, or pain around your incision.  Your incision feels warm to the touch.  You have pus or a bad smell coming from your incision.  The edges of your incision break open after the sutures have been removed.  Your pain does not improve after 2-3 days.  You have a rash.  You repeatedly become dizzy or light-headed.  Your pain medicine is not helping.  You are  constipated. Get help right away if:  You have a fever.  You faint.  You have increasing pain in your abdomen.  You have severe pain in one or both of your shoulders.  You have fluid or blood coming from your sutures or from your vagina.  You have shortness of breath or difficulty breathing.  You have chest pain or leg pain.  You have ongoing nausea, vomiting, or diarrhea. This information is not intended to replace advice given to you by your health care provider. Make sure you discuss any questions you have with your health care provider. Document Released: 09/14/2004 Document Revised: 07/31/2015 Document Reviewed: 02/05/2015 Elsevier Interactive Patient Education  2017 [employer]  General Anesthesia, Adult General anesthesia is the use of medicines to make a person "go to sleep" (be unconscious) for a medical procedure. General anesthesia is often recommended when a procedure:  Is long.  Requires you to be still or in an unusual position.  Is major and can cause you to lose blood.  Is impossible to do without general anesthesia. The medicines used for general anesthesia are called general anesthetics. In addition to making you sleep, the medicines:  Prevent pain.  Control your blood pressure.  Relax your muscles. Tell a health care provider about:  Any allergies you have.  All medicines you are taking, including vitamins, herbs, eye drops, creams, and over-the-counter medicines.  Any problems you or family members have had with anesthetic medicines.  Types of anesthetics you have had in the past.  Any bleeding disorders you have.  Any surgeries you have had.  Any medical conditions you have.  Any history of heart or lung conditions, such as heart failure, sleep apnea, or chronic obstructive pulmonary disease (COPD).  Whether you are pregnant or may be pregnant.  Whether you use tobacco, alcohol, marijuana, or street drugs.  Any history of Social worker.  Any history of depression or anxiety. What are the risks? Generally, this is a safe procedure. However, problems may occur, including:  Allergic reaction to anesthetics.  Lung and heart problems.  Inhaling food or liquids from your stomach into your lungs (aspiration).  Injury to nerves.  Waking up during your procedure and being unable to move (rare).  Extreme agitation or a state of mental confusion (delirium) when you wake up from the anesthetic.  Air in the bloodstream, which can lead to stroke. These problems are more likely to develop if you are having a major surgery or if you have an advanced medical condition. You can prevent some of these complications by answering all of  your health care provider's questions thoroughly and by following all pre-procedure instructions. General anesthesia can cause side effects, including:  Nausea or vomiting  A sore throat from the breathing tube.  Feeling cold or shivery.  Feeling tired, washed out, or achy.  Sleepiness or drowsiness.  Confusion or agitation. What happens before the procedure? Staying hydrated  Follow instructions from your health care provider about hydration, which may include:  Up to 2 hours before the procedure - you may continue to drink clear liquids, such as water, clear fruit juice, black coffee, and plain tea. Eating and drinking restrictions  Follow instructions from your health care provider about eating and drinking, which may include:  8 hours before the procedure - stop eating heavy meals or foods such as meat, fried foods, or fatty foods.  6 hours before the procedure - stop eating light meals or foods, such as toast or cereal.  6 hours before the procedure - stop drinking milk or drinks that contain milk.  2 hours before the procedure - stop drinking clear liquids. Medicines   Ask your health care provider about:  Changing or stopping your regular medicines. This is especially  important if you are taking diabetes medicines or blood thinners.  Taking medicines such as aspirin and ibuprofen. These medicines can thin your blood. Do not take these medicines before your procedure if your health care provider instructs you not to.  Taking new dietary supplements or medicines. Do not take these during the week before your procedure unless your health care provider approves them.  If you are told to take a medicine or to continue taking a medicine on the day of the procedure, take the medicine with sips of water. General instructions    Ask if you will be going home the same day, the following day, or after a longer hospital stay.  Plan to have someone take you home.  Plan to have someone stay with you for the first 24 hours after you leave the hospital or clinic.  For 3-6 weeks before the procedure, try not to use any tobacco products, such as cigarettes, chewing tobacco, and e-cigarettes.  You may brush your teeth on the morning of the procedure, but make sure to spit out the toothpaste. What happens during the procedure?  You will be given anesthetics through a mask and through an IV tube in one of your veins.  You may receive medicine to help you relax (sedative).  As soon as you are asleep, a breathing tube may be used to help you breathe.  An anesthesia specialist will stay with you throughout the procedure. He or she will help keep you comfortable and safe by continuing to give you medicines and adjusting the amount of medicine that you get. He or she will also watch your blood pressure, pulse, and oxygen levels to make sure that the anesthetics do not cause any problems.  If a breathing tube was used to help you breathe, it will be removed before you wake up. The procedure may vary among health care providers and hospitals. What happens after the procedure?  You will wake up, often slowly, after the procedure is complete, usually in a recovery  area.  Your blood pressure, heart rate, breathing rate, and blood oxygen level will be monitored until the medicines you were given have worn off.  You may be given medicine to help you calm down if you feel anxious or agitated.  If you will be going home the  same day, your health care provider may check to make sure you can stand, drink, and urinate.  Your health care providers will treat your pain and side effects before you go home.  Do not drive for 24 hours if you received a sedative.  You may:  Feel nauseous and vomit.  Have a sore throat.  Have mental slowness.  Feel cold or shivery.  Feel sleepy.  Feel tired.  Feel sore or achy, even in parts of your body where you did not have surgery. This information is not intended to replace advice given to you by your health care provider. Make sure you discuss any questions you have with your health care provider. Document Released: 06/04/2007 Document Revised: 08/08/2015 Document Reviewed: 02/09/2015 Elsevier Interactive Patient Education  2017 [employer] Inc[employer] General Anesthesia, Adult, Care After These instructions provide you with information about caring for yourself after your procedure. Your health care provider may also give you more specific instructions. Your treatment has been planned according to current medical practices, but problems sometimes occur. Call your health care provider if you have any problems or questions after your procedure. What can I expect after the procedure? After the procedure, it is common to have:  Vomiting.  A sore throat.  Mental slowness. It is common to feel:  Nauseous.  Cold or shivery.  Sleepy.  Tired.  Sore or achy, even in parts of your body where you did not have surgery. Follow these instructions at home: For at least 24 hours after the procedure:   Do not:  Participate in activities where you could fall or become injured.  Drive.  Use heavy machinery.  Drink  alcohol.  Take sleeping pills or medicines that cause drowsiness.  Make important decisions or sign legal documents.  Take care of children on your own.  Rest. Eating and drinking   If you vomit, drink water, juice, or soup when you can drink without vomiting.  Drink enough fluid to keep your urine clear or pale yellow.  Make sure you have little or no nausea before eating solid foods.  Follow the diet recommended by your health care provider. General instructions   Have a responsible adult stay with you until you are awake and alert.  Return to your normal activities as told by your health care provider. Ask your health care provider what activities are safe for you.  Take over-the-counter and prescription medicines only as told by your health care provider.  If you smoke, do not smoke without supervision.  Keep all follow-up visits as told by your health care provider. This is important. Contact a health care provider if:  You continue to have nausea or vomiting at home, and medicines are not helpful.  You cannot drink fluids or start eating again.  You cannot urinate after 8-12 hours.  You develop a skin rash.  You have fever.  You have increasing redness at the site of your procedure. Get help right away if:  You have difficulty breathing.  You have chest pain.  You have unexpected bleeding.  You feel that you are having a life-threatening or urgent problem. This information is not intended to replace advice given to you by your health care provider. Make sure you discuss any questions you have with your health care provider. Document Released: 06/03/2000 Document Revised: 07/31/2015 Document Reviewed: 02/09/2015 Elsevier Interactive Patient Education  2017 [employer].

## 2016-07-11 ENCOUNTER — Encounter (HOSPITAL_COMMUNITY): Payer: Self-pay

## 2016-07-11 ENCOUNTER — Encounter (HOSPITAL_COMMUNITY)
Admission: RE | Admit: 2016-07-11 | Discharge: 2016-07-11 | Disposition: A | Discharge status: Home / Self Care | Payer: Medicaid Other | Source: Ambulatory Visit | Attending: Obstetrics & Gynecology | Admitting: Obstetrics & Gynecology

## 2016-07-11 DIAGNOSIS — Z01812 Encounter for preprocedural laboratory examination: Secondary | ICD-10-CM | POA: Diagnosis not present

## 2016-07-11 LAB — URINALYSIS, ROUTINE W REFLEX MICROSCOPIC
Bilirubin Urine: NEGATIVE
Glucose, UA: NEGATIVE mg/dL
Ketones, ur: NEGATIVE mg/dL
Leukocytes, UA: NEGATIVE
Nitrite: NEGATIVE
Protein, ur: NEGATIVE mg/dL
SPECIFIC GRAVITY, URINE: 1.021 (ref 1.005–1.030)
pH: 5 (ref 5.0–8.0)

## 2016-07-11 LAB — HCG, QUANTITATIVE, PREGNANCY: hCG, Beta Chain, Quant, S: 1 m[IU]/mL (ref <5)

## 2016-07-11 LAB — COMPREHENSIVE METABOLIC PANEL
ALBUMIN: 4 g/dL (ref 3.5–5.0)
ALK PHOS: 79 U/L (ref 38–126)
ALT: 52 U/L (ref 14–54)
ANION GAP: 7 (ref 5–15)
AST: 36 U/L (ref 15–41)
BUN: 15 mg/dL (ref 6–20)
CALCIUM: 9.4 mg/dL (ref 8.9–10.3)
CO2: 27 mmol/L (ref 22–32)
CREATININE: 0.73 mg/dL (ref 0.44–1.00)
Chloride: 105 mmol/L (ref 101–111)
GFR calc Af Amer: >60 mL/min (ref >60)
GFR calc non Af Amer: >60 mL/min (ref >60)
GLUCOSE: 95 mg/dL (ref 65–99)
Potassium: 3.8 mmol/L (ref 3.5–5.1)
Sodium: 139 mmol/L (ref 135–145)
TOTAL PROTEIN: 7 g/dL (ref 6.5–8.1)
Total Bilirubin: 0.3 mg/dL (ref 0.3–1.2)

## 2016-07-11 LAB — CBC
HCT: 38.1 % (ref 36.0–46.0)
Hemoglobin: 12.3 g/dL (ref 12.0–15.0)
MCH: 27.2 pg (ref 26.0–34.0)
MCHC: 32.3 g/dL (ref 30.0–36.0)
MCV: 84.3 fL (ref 78.0–100.0)
Platelets: 203 10*3/uL (ref 150–400)
RBC: 4.52 MIL/uL (ref 3.87–5.11)
RDW: 14.6 % (ref 11.5–15.5)
WBC: 7.9 10*3/uL (ref 4.0–10.5)

## 2016-07-17 ENCOUNTER — Ambulatory Visit (HOSPITAL_COMMUNITY): Payer: Medicaid Other | Admitting: Anesthesiology

## 2016-07-17 ENCOUNTER — Encounter (HOSPITAL_COMMUNITY)
Admission: RE | Disposition: A | Discharge status: Home / Self Care | Payer: Self-pay | Source: Ambulatory Visit | Attending: Obstetrics & Gynecology

## 2016-07-17 ENCOUNTER — Ambulatory Visit (HOSPITAL_COMMUNITY)
Admission: RE | Admit: 2016-07-17 | Discharge: 2016-07-17 | Disposition: A | Discharge status: Home / Self Care | Payer: Medicaid Other | Source: Ambulatory Visit | Attending: Obstetrics & Gynecology | Admitting: Obstetrics & Gynecology

## 2016-07-17 ENCOUNTER — Encounter (HOSPITAL_COMMUNITY): Payer: Self-pay | Admitting: *Deleted

## 2016-07-17 DIAGNOSIS — Z302 Encounter for sterilization: Secondary | ICD-10-CM | POA: Diagnosis not present

## 2016-07-17 DIAGNOSIS — E876 Hypokalemia: Secondary | ICD-10-CM | POA: Insufficient documentation

## 2016-07-17 DIAGNOSIS — Z87891 Personal history of nicotine dependence: Secondary | ICD-10-CM | POA: Diagnosis not present

## 2016-07-17 DIAGNOSIS — E669 Obesity, unspecified: Secondary | ICD-10-CM | POA: Insufficient documentation

## 2016-07-17 DIAGNOSIS — N83202 Unspecified ovarian cyst, left side: Secondary | ICD-10-CM | POA: Insufficient documentation

## 2016-07-17 DIAGNOSIS — Z6834 Body mass index (BMI) 34.0-34.9, adult: Secondary | ICD-10-CM | POA: Insufficient documentation

## 2016-07-17 DIAGNOSIS — F329 Major depressive disorder, single episode, unspecified: Secondary | ICD-10-CM | POA: Insufficient documentation

## 2016-07-17 HISTORY — PX: LAPAROSCOPIC TUBAL LIGATION: SHX1937

## 2016-07-17 SURGERY — LIGATION, FALLOPIAN TUBE, LAPAROSCOPIC
Anesthesia: General | Laterality: Bilateral

## 2016-07-17 MED ORDER — CEFAZOLIN SODIUM-DEXTROSE 2-4 GM/100ML-% IV SOLN
2.0000 g | INTRAVENOUS | Status: AC
  Administered 2016-07-17: 2 g via INTRAVENOUS
  Filled 2016-07-17: qty 100

## 2016-07-17 MED ORDER — PROPOFOL 10 MG/ML IV BOLUS
INTRAVENOUS | Status: AC
  Filled 2016-07-17: qty 20

## 2016-07-17 MED ORDER — FENTANYL CITRATE (PF) 100 MCG/2ML IJ SOLN
INTRAMUSCULAR | Status: DC | PRN
  Administered 2016-07-17 (×4): 50 ug via INTRAVENOUS

## 2016-07-17 MED ORDER — HYDROMORPHONE HCL 1 MG/ML IJ SOLN
0.2500 mg | INTRAMUSCULAR | Status: DC | PRN
  Administered 2016-07-17 (×3): 0.5 mg via INTRAVENOUS
  Filled 2016-07-17 (×2): qty 1

## 2016-07-17 MED ORDER — ONDANSETRON HCL 4 MG/2ML IJ SOLN
INTRAMUSCULAR | Status: AC
  Filled 2016-07-17: qty 2

## 2016-07-17 MED ORDER — ONDANSETRON 8 MG PO TBDP: 8.0000 mg | ORAL_TABLET | Freq: Three times a day (TID) | ORAL | 0 refills | Status: DC | PRN

## 2016-07-17 MED ORDER — KETOROLAC TROMETHAMINE 10 MG PO TABS: 10.0000 mg | ORAL_TABLET | Freq: Three times a day (TID) | ORAL | 0 refills | Status: DC | PRN

## 2016-07-17 MED ORDER — LIDOCAINE HCL (PF) 1 % IJ SOLN
INTRAMUSCULAR | Status: AC
  Filled 2016-07-17: qty 5

## 2016-07-17 MED ORDER — NEOSTIGMINE METHYLSULFATE 10 MG/10ML IV SOLN
INTRAVENOUS | Status: DC | PRN
  Administered 2016-07-17: 4 mg via INTRAVENOUS

## 2016-07-17 MED ORDER — MIDAZOLAM HCL 2 MG/2ML IJ SOLN
INTRAMUSCULAR | Status: AC
  Filled 2016-07-17: qty 2

## 2016-07-17 MED ORDER — HYDROCODONE-ACETAMINOPHEN 5-325 MG PO TABS: 1.0000 | ORAL_TABLET | Freq: Four times a day (QID) | ORAL | 0 refills | Status: DC | PRN

## 2016-07-17 MED ORDER — KETOROLAC TROMETHAMINE 30 MG/ML IJ SOLN
30.0000 mg | Freq: Once | INTRAMUSCULAR | Status: AC
  Administered 2016-07-17: 30 mg via INTRAVENOUS
  Filled 2016-07-17: qty 1

## 2016-07-17 MED ORDER — PROPOFOL 10 MG/ML IV BOLUS
INTRAVENOUS | Status: DC | PRN
  Administered 2016-07-17: 140 mg via INTRAVENOUS

## 2016-07-17 MED ORDER — BUPIVACAINE LIPOSOME 1.3 % IJ SUSP
INTRAMUSCULAR | Status: DC | PRN
  Administered 2016-07-17: 20 mL

## 2016-07-17 MED ORDER — GLYCOPYRROLATE 0.2 MG/ML IJ SOLN
INTRAMUSCULAR | Status: DC | PRN
  Administered 2016-07-17: .6 mg via INTRAVENOUS

## 2016-07-17 MED ORDER — LACTATED RINGERS IV SOLN
INTRAVENOUS | Status: DC
  Administered 2016-07-17: 07:00:00 via INTRAVENOUS

## 2016-07-17 MED ORDER — SUCCINYLCHOLINE CHLORIDE 20 MG/ML IJ SOLN
INTRAMUSCULAR | Status: AC
  Filled 2016-07-17: qty 1

## 2016-07-17 MED ORDER — DEXAMETHASONE SODIUM PHOSPHATE 4 MG/ML IJ SOLN
4.0000 mg | Freq: Once | INTRAMUSCULAR | Status: AC
  Administered 2016-07-17: 4 mg via INTRAVENOUS

## 2016-07-17 MED ORDER — FENTANYL CITRATE (PF) 250 MCG/5ML IJ SOLN
INTRAMUSCULAR | Status: AC
  Filled 2016-07-17: qty 5

## 2016-07-17 MED ORDER — ROCURONIUM BROMIDE 50 MG/5ML IV SOLN
INTRAVENOUS | Status: AC
  Filled 2016-07-17: qty 1

## 2016-07-17 MED ORDER — ONDANSETRON HCL 4 MG/2ML IJ SOLN
4.0000 mg | Freq: Once | INTRAMUSCULAR | Status: AC
  Administered 2016-07-17: 4 mg via INTRAVENOUS

## 2016-07-17 MED ORDER — MIDAZOLAM HCL 2 MG/2ML IJ SOLN
1.0000 mg | INTRAMUSCULAR | Status: AC
  Administered 2016-07-17: 2 mg via INTRAVENOUS

## 2016-07-17 MED ORDER — DEXAMETHASONE SODIUM PHOSPHATE 4 MG/ML IJ SOLN
INTRAMUSCULAR | Status: AC
  Filled 2016-07-17: qty 1

## 2016-07-17 MED ORDER — LIDOCAINE HCL (CARDIAC) 10 MG/ML IV SOLN
INTRAVENOUS | Status: DC | PRN
  Administered 2016-07-17: 4 mg via INTRAVENOUS

## 2016-07-17 MED ORDER — ROCURONIUM 10MG/ML (10ML) SYRINGE FOR MEDFUSION PUMP - OPTIME
INTRAVENOUS | Status: DC | PRN
Start: 2016-07-17 — End: 2016-07-17
  Administered 2016-07-17: 10 mg via INTRAVENOUS
  Administered 2016-07-17: 30 mg via INTRAVENOUS

## 2016-07-17 SURGICAL SUPPLY — 31 items
BAG HAMPER (MISCELLANEOUS) ×3 IMPLANT
BLADE SURG SZ11 CARB STEEL (BLADE) ×3 IMPLANT
CATH ROBINSON RED A/P 16FR (CATHETERS) ×6 IMPLANT
CLOTH BEACON ORANGE TIMEOUT ST (SAFETY) ×3 IMPLANT
COVER LIGHT HANDLE STERIS (MISCELLANEOUS) ×6 IMPLANT
ELECT REM PT RETURN 9FT ADLT (ELECTROSURGICAL) ×3
ELECTRODE REM PT RTRN 9FT ADLT (ELECTROSURGICAL) ×1 IMPLANT
GLOVE BIOGEL PI IND STRL 7.0 (GLOVE) ×1 IMPLANT
GLOVE BIOGEL PI IND STRL 8 (GLOVE) ×1 IMPLANT
GLOVE BIOGEL PI INDICATOR 7.0 (GLOVE) ×2
GLOVE BIOGEL PI INDICATOR 8 (GLOVE) ×2
GLOVE ECLIPSE 8.0 STRL XLNG CF (GLOVE) ×3 IMPLANT
GOWN STRL REUS W/TWL LRG LVL3 (GOWN DISPOSABLE) ×6 IMPLANT
GOWN STRL REUS W/TWL XL LVL3 (GOWN DISPOSABLE) ×3 IMPLANT
INST SET LAPROSCOPIC GYN AP (KITS) ×3 IMPLANT
KIT ROOM TURNOVER AP CYSTO (KITS) ×3 IMPLANT
MANIFOLD NEPTUNE II (INSTRUMENTS) ×3 IMPLANT
NEEDLE INSUFFLATION 14GA 120MM (NEEDLE) ×3 IMPLANT
PACK PERI GYN (CUSTOM PROCEDURE TRAY) ×3 IMPLANT
PAD ARMBOARD 7.5X6 YLW CONV (MISCELLANEOUS) ×3 IMPLANT
SET BASIN LINEN APH (SET/KITS/TRAYS/PACK) ×3 IMPLANT
SOLUTION ANTI FOG 6CC (MISCELLANEOUS) ×3 IMPLANT
SPONGE GAUZE 2X2 8PLY STER LF (GAUZE/BANDAGES/DRESSINGS) ×1
SPONGE GAUZE 2X2 8PLY STRL LF (GAUZE/BANDAGES/DRESSINGS) ×2 IMPLANT
STAPLER VISISTAT 35W (STAPLE) ×3 IMPLANT
SUT VICRYL 0 UR6 27IN ABS (SUTURE) ×3 IMPLANT
SYRINGE 10CC LL (SYRINGE) ×3 IMPLANT
TAPE PAPER 3X10 WHT MICROPORE (GAUZE/BANDAGES/DRESSINGS) ×3 IMPLANT
TROCAR ENDO BLADELESS 11MM (ENDOMECHANICALS) ×3 IMPLANT
TUBING INSUFFLATION (TUBING) ×3 IMPLANT
WARMER LAPAROSCOPE (MISCELLANEOUS) ×3 IMPLANT

## 2016-07-17 NOTE — Transfer of Care (Signed)
Immediate Anesthesia Transfer of Care Note  Patient: [name]  Procedure(s) Performed: Procedure(s): LAPAROSCOPIC BILATERAL STERILIZATION USING ELECTROCAUTERY (Bilateral)  Patient Location: PACU  Anesthesia Type:General  Level of Consciousness: awake  Airway & Oxygen Therapy: Patient Spontanous Breathing  Post-op Assessment: Report given to RN and Post -op Vital signs reviewed and stable  Post vital signs: Reviewed  Last Vitals:  Vitals:   07/17/16 0720 07/17/16 0725  BP: 113/72 109/73  Pulse:    Resp: (!) 23 (!) 33  Temp:      Last Pain:  Vitals:   07/17/16 0643  TempSrc: Oral      Patients Stated Pain Goal: 6 (07/17/16 [id number])  Complications: No apparent anesthesia complications

## 2016-07-17 NOTE — Discharge Instructions (Signed)
Laparoscopic Tubal Ligation, Care After °Refer to this sheet in the next few weeks. These instructions provide you with information about caring for yourself after your procedure. Your health care provider may also give you more specific instructions. Your treatment has been planned according to current medical practices, but problems sometimes occur. Call your health care provider if you have any problems or questions after your procedure. °What can I expect after the procedure? °After the procedure, it is common to have: °· A sore throat. °· Discomfort in your shoulder. °· Mild discomfort or cramping in your abdomen. °· Gas pains. °· Pain or soreness in the area where the surgical cut (incision) was made. °· A bloated feeling. °· Tiredness. °· Nausea. °· Vomiting. °Follow these instructions at home: °Medicines °· Take over-the-counter and prescription medicines only as told by your health care provider. °· Do not take aspirin because it can cause bleeding. °· Do not drive or operate heavy machinery while taking prescription pain medicine. °Activity °· Rest for the rest of the day. °· Return to your normal activities as told by your health care provider. Ask your health care provider what activities are safe for you. °Incision care °· Follow instructions from your health care provider about how to take care of your incision. Make sure you: °¨ Wash your hands with soap and water before you change your bandage (dressing). If soap and water are not available, use hand sanitizer. °¨ Change your dressing as told by your health care provider. °¨ Leave stitches (sutures) in place. They may need to stay in place for 2 weeks or longer. °· Check your incision area every day for signs of infection. Check for: °¨ More redness, swelling, or pain. °¨ More fluid or blood. °¨ Warmth. °¨ Pus or a bad smell. °Other Instructions °· Do not take baths, swim, or use a hot tub until your health care provider approves. You may take  showers. °· Keep all follow-up visits as told by your health care provider. This is important. °· Have someone help you with your daily household tasks for the first few days. °Contact a health care provider if: °· You have more redness, swelling, or pain around your incision. °· Your incision feels warm to the touch. °· You have pus or a bad smell coming from your incision. °· The edges of your incision break open after the sutures have been removed. °· Your pain does not improve after 2-3 days. °· You have a rash. °· You repeatedly become dizzy or light-headed. °· Your pain medicine is not helping. °· You are constipated. °Get help right away if: °· You have a fever. °· You faint. °· You have increasing pain in your abdomen. °· You have severe pain in one or both of your shoulders. °· You have fluid or blood coming from your sutures or from your vagina. °· You have shortness of breath or difficulty breathing. °· You have chest pain or leg pain. °· You have ongoing nausea, vomiting, or diarrhea. °This information is not intended to replace advice given to you by your health care provider. Make sure you discuss any questions you have with your health care provider. °Document Released: 09/14/2004 Document Revised: 07/31/2015 Document Reviewed: 02/05/2015 °Elsevier Interactive Patient Education © 2017 [employer] Inc. ° °

## 2016-07-17 NOTE — H&P (Signed)
Preoperative History and Physical  Kristina [name] is a 26 y.o. [phone] with Patient's last menstrual period was 07/07/2016. admitted for a laparoscopic tubal sterilization: electrocautery method. Pt desires ideally bilateral salpingectomy however Medicaid is not currently reimbursing for this and patient declines to pay for it out of pocket.   PMH:        Past Medical History:  Diagnosis Date  . Anemia   . Herpes   . Miscarriage   . Rh negative state in antepartum period   . Urinary tract infection   . Vaginal Pap smear, abnormal     PSH:          Past Surgical History:  Procedure Laterality Date  . CHOLECYSTECTOMY N/A 01/04/2015   Procedure: LAPAROSCOPIC CHOLECYSTECTOMY;  Surgeon: Franky Macho, MD;  Location: [facility];  Service: General;  Laterality: N/A;  . TONSILLECTOMY      POb/GynH:              OB History    Gravida Para Term Preterm AB Living   4 3 3   1 3    SAB TAB Ectopic Multiple Live Births   1     0 3      SH:         Social History  Substance Use Topics  . Smoking status: Former Smoker    Packs/day: 0.50    Years: 9.00    Types: Cigarettes    Quit date: 01/01/2013  . Smokeless tobacco: Never Used  . Alcohol use No     Comment: occasional    FH:          Family History  Problem Relation Age of Onset  . Cancer Maternal Grandmother     breast  . Diabetes Paternal Grandfather   . Jaundice Son      Allergies: No Known Allergies  Medications:      No current outpatient prescriptions on file.  Review of Systems:   Review of Systems  Constitutional: Negative for fever, chills, weight loss, malaise/fatigue and diaphoresis.  HENT: Negative for hearing loss, ear pain, nosebleeds, congestion, sore throat, neck pain, tinnitus and ear discharge.   Eyes: Negative for blurred vision, double vision, photophobia, pain, discharge and redness.  Respiratory: Negative for cough, hemoptysis, sputum  production, shortness of breath, wheezing and stridor.   Cardiovascular: Negative for chest pain, palpitations, orthopnea, claudication, leg swelling and PND.  Gastrointestinal: Positive for abdominal pain. Negative for heartburn, nausea, vomiting, diarrhea, constipation, blood in stool and melena.  Genitourinary: Negative for dysuria, urgency, frequency, hematuria and flank pain.  Musculoskeletal: Negative for myalgias, back pain, joint pain and falls.  Skin: Negative for itching and rash.  Neurological: Negative for dizziness, tingling, tremors, sensory change, speech change, focal weakness, seizures, loss of consciousness, weakness and headaches.  Endo/Heme/Allergies: Negative for environmental allergies and polydipsia. Does not bruise/bleed easily.  Psychiatric/Behavioral: Negative for depression, suicidal ideas, hallucinations, memory loss and substance abuse. The patient is not nervous/anxious and does not have insomnia.      PHYSICAL EXAM:  Blood pressure 112/70, pulse 74, weight 226 lb (102.5 kg), last menstrual period 07/07/2016, unknown if currently breastfeeding.    Vitals reviewed. Constitutional: She is oriented to person, place, and time. She appears well-developed and well-nourished.  HENT:  Head: Normocephalic and atraumatic.  Right Ear: External ear normal.  Left Ear: External ear normal.  Nose: Nose normal.  Mouth/Throat: Oropharynx is clear and moist.  Eyes: Conjunctivae and EOM are normal. Pupils are equal,  round, and reactive to light. Right eye exhibits no discharge. Left eye exhibits no discharge. No scleral icterus.  Neck: Normal range of motion. Neck supple. No tracheal deviation present. No thyromegaly present.  Cardiovascular: Normal rate, regular rhythm, normal heart sounds and intact distal pulses.  Exam reveals no gallop and no friction rub.   No murmur heard. Respiratory: Effort normal and breath sounds normal. No respiratory distress. She has no  wheezes. She has no rales. She exhibits no tenderness.  GI: Soft. Bowel sounds are normal. She exhibits no distension and no mass. There is tenderness. There is no rebound and no guarding.  Genitourinary:       Vulva is normal without lesions Vagina is pink moist without discharge Cervix normal in appearance and pap is normal Uterus is normal size, contour, position, consistency, mobility, non-tender Adnexa is negative with normal sized ovaries by sonogram  Musculoskeletal: Normal range of motion. She exhibits no edema and no tenderness.  Neurological: She is alert and oriented to person, place, and time. She has normal reflexes. She displays normal reflexes. No cranial nerve deficit. She exhibits normal muscle tone. Coordination normal.  Skin: Skin is warm and dry. No rash noted. No erythema. No pallor.  Psychiatric: She has a normal mood and affect. Her behavior is normal. Judgment and thought content normal.    Labs: No results found for this or any previous visit (from the past 336 hour(s)).  EKG: No orders found for this or any previous visit.  Imaging Studies:  ImagingResults  [place] Renal  Result Date: 06/14/2016 CLINICAL DATA:  Patient with right flank pain radiating to the back. EXAM: RENAL / URINARY TRACT ULTRASOUND COMPLETE COMPARISON:  Pelvic ultrasound 01/17/2016 FINDINGS: Right Kidney: Length: 13.5 cm. Echogenicity within normal limits. No mass or hydronephrosis visualized. Left Kidney: Length: 13.5 cm. Echogenicity within normal limits. No mass or hydronephrosis visualized. Bladder: Appears normal for degree of bladder distention. Incidental note is made of a 5.2 cm left ovarian cyst. Increased echogenicity of the hepatic parenchyma. IMPRESSION: No hydronephrosis. Findings suggestive of hepatic steatosis. 5.2 cm left ovarian cyst. Recommend follow-up pelvic ultrasound in 12 months. Electronically Signed   By: Annia Beltrew  Davis M.D.   On: 06/14/2016 15:23        Assessment: [id number] female desires permanent sterilization: Since bilateral salpingectomy is not an option for meidcaid patients at present, pt chooses electrocautery method All options discussed      Patient Active Problem List   Diagnosis Date Noted  . Full-term premature rupture of membranes 05/30/2016  . Irregular fetal heart rate 02/14/2016  . Dehydration, moderate 11/28/2015  . Gastroenteritis 11/28/2015  . Hypokalemia 11/28/2015  . Pregnancy 11/28/2015  . Viral gastroenteritis 11/28/2015  . Depression 11/21/2015  . First trimester bleeding 11/21/2015  . Left ovarian cyst 10/24/2015  . Frequent headaches 09/05/2015  . Obese 09/05/2015  . Genital herpes 08/09/2015  . Rh negative state in antepartum period 11/09/2013  . Abnormal Pap smear of cervix 04/06/2013    Plan: Laparoscopic bilateral sterilization: electrocautery method  Understands permanent nature of procedure as well as failure rate of 1/300  EURE,LUTHER H 07/09/2016 11:07 AM

## 2016-07-17 NOTE — Interval H&P Note (Signed)
History and Physical Interval Note:  07/17/2016 7:09 AM  Kristina [name]  has presented today for surgery, with the diagnosis of Sterilization  The various methods of treatment have been discussed with the patient and family. After consideration of risks, benefits and other options for treatment, the patient has consented to  Procedure(s): LAPAROSCOPIC TUBAL LIGATION (Bilateral) as a surgical intervention .  The patient's history has been reviewed, patient examined, no change in status, stable for surgery.  I have reviewed the patient's chart and labs.  Questions were answered to the patient's satisfaction.     Yvonne Stopher H

## 2016-07-17 NOTE — Anesthesia Preprocedure Evaluation (Signed)
Anesthesia Evaluation  Patient identified by MRN, date of birth, ID band Patient awake    Reviewed: Allergy & Precautions, NPO status , Patient's Chart, lab work & pertinent test results, Unable to perform ROS - Chart review only  Airway Mallampati: II  TM Distance: >3 FB Neck ROM: Full    Dental  (+) Teeth Intact   Pulmonary neg pulmonary ROS, former smoker,    Pulmonary exam normal        Cardiovascular negative cardio ROS Normal cardiovascular exam     Neuro/Psych  Headaches, PSYCHIATRIC DISORDERS Depression    GI/Hepatic negative GI ROS, Neg liver ROS,   Endo/Other  negative endocrine ROS  Renal/GU negative Renal ROS     Musculoskeletal negative musculoskeletal ROS (+)   Abdominal (+) + obese,   Peds  Hematology negative hematology ROS (+)   Anesthesia Other Findings   Reproductive/Obstetrics                             Anesthesia Physical Anesthesia Plan  ASA: II  Anesthesia Plan: General   Post-op Pain Management:    Induction: Intravenous  Airway Management Planned: Oral ETT  Additional Equipment:   Intra-op Plan:   Post-operative Plan: Extubation in OR  Informed Consent: I have reviewed the patients History and Physical, chart, labs and discussed the procedure including the risks, benefits and alternatives for the proposed anesthesia with the patient or authorized representative who has indicated his/her understanding and acceptance.   Dental advisory given  Plan Discussed with: CRNA  Anesthesia Plan Comments:         Anesthesia Quick Evaluation

## 2016-07-17 NOTE — Anesthesia Procedure Notes (Signed)
Procedure Name: Intubation Date/Time: 07/17/2016 7:41 AM Performed by: Tressie Stalker E Pre-anesthesia Checklist: Patient identified, Patient being monitored, Timeout performed, Emergency Drugs available and Suction available Patient Re-evaluated:Patient Re-evaluated prior to inductionOxygen Delivery Method: Circle system utilized Preoxygenation: Pre-oxygenation with 100% oxygen Intubation Type: IV induction Ventilation: Mask ventilation without difficulty Laryngoscope Size: Mac and 3 Grade View: Grade I Tube type: Oral Tube size: 7.0 mm Number of attempts: 1 Airway Equipment and Method: Stylet Placement Confirmation: ETT inserted through vocal cords under direct vision,  positive ETCO2 and breath sounds checked- equal and bilateral Secured at: 21 cm Tube secured with: Tape Dental Injury: Teeth and Oropharynx as per pre-operative assessment

## 2016-07-17 NOTE — Anesthesia Postprocedure Evaluation (Signed)
Anesthesia Post Note  Patient: Hughes BetterJanie R Goto  Procedure(s) Performed: Procedure(s) (LRB): LAPAROSCOPIC BILATERAL STERILIZATION USING ELECTROCAUTERY (Bilateral)  Patient location during evaluation: Short Stay Anesthesia Type: General Level of consciousness: awake and alert and oriented Pain management: pain level controlled Vital Signs Assessment: post-procedure vital signs reviewed and stable Respiratory status: spontaneous breathing Cardiovascular status: blood pressure returned to baseline Postop Assessment: no signs of nausea or vomiting Anesthetic complications: no     Last Vitals:  Vitals:   07/17/16 0900 07/17/16 0927  BP: 112/63 90/67  Pulse: 71 84  Resp: 12 16  Temp:  36.5 C    Last Pain:  Vitals:   07/17/16 0927  TempSrc: Oral  PainSc:                  Glynn OctaveANIEL,Jaelen Soth

## 2016-07-17 NOTE — Op Note (Signed)
Preoperative Diagnosis:  Multiparous female desires permanent sterilization  Postoperative Diagnosis:  Same as above  Procedure:  Laparoscopic Bilateral Sterilization procedure:  Electrocautery method  Surgeon:  Rockne CoonsLuther H Eure  Jr MD  Anaesthesia:  LMA  Findings:  Patient had normal pelvic anatomy and no intraperitoneal abnormalities.  Description of Operation:  Patient was taken to the OR and placed into supine position where she underwent LMA anaesthesia.  She was placed in the dorsal lithotomy position and prepped and draped in the usual sterile fashion.  An incision was made in the umbilicus and dissection taken down to the rectus fascia which was incised and opened.  The non bladed trocar was then placed and the peritoneal cavity was insufflated.  The above noted findings were observed.  The Kleppinger electrocautery unit was employed and both tubes were burned to no resistance and beyond in the distal ishtmic and ampullary regions, approximately a 3.5 cm segment bilaterally.  There was good hemostasis.  The fascia, peritoneum and subcutaneous tissue were closed using 0 vicryl.  The skin was closed using staples.  The patient was awakened from anaesthesia and taken to the PACU with all counts being correct x 3.  The patient received Ancef 1 gram and Toradol 30 mg IV preoperatively.  Lazaro ArmsEURE,LUTHER H, MD 07/17/2016 8:27 AM

## 2016-07-18 ENCOUNTER — Encounter (HOSPITAL_COMMUNITY): Payer: Self-pay | Admitting: Obstetrics & Gynecology

## 2016-07-25 ENCOUNTER — Encounter: Payer: Medicaid Other | Admitting: Obstetrics & Gynecology

## 2016-07-26 ENCOUNTER — Encounter: Payer: Self-pay | Admitting: Obstetrics & Gynecology

## 2016-07-26 ENCOUNTER — Ambulatory Visit (INDEPENDENT_AMBULATORY_CARE_PROVIDER_SITE_OTHER): Payer: Medicaid Other | Admitting: Obstetrics & Gynecology

## 2016-07-26 VITALS — BP 130/80 | HR 78 | Wt 231.0 lb

## 2016-07-26 DIAGNOSIS — Z9889 Other specified postprocedural states: Secondary | ICD-10-CM

## 2016-07-26 NOTE — Progress Notes (Signed)
  HPI: Patient returns for routine postoperative follow-up having undergone laparoscopic bilateral sterilization procedure: electrocautery on 07/18/2014.  The patient's immediate postoperative recovery has been unremarkable. Since hospital discharge the patient reports no problems.   No current outpatient prescriptions on file. No current facility-administered medications for this visit.     Blood pressure 130/80, pulse 78, weight 231 lb (104.8 kg), last menstrual period 07/07/2016, unknown if currently breastfeeding.  Physical Exam: Incision clean dry intact  Diagnostic Tests:   Pathology: na  Impression: S/p laparoscopic bilateral sterilization using electrocautery  Plan:   Follow up: 1  years  Lazaro ArmsEURE,Lareen Mullings H, MD

## 2016-08-09 NOTE — Addendum Note (Signed)
Addendum  created 08/09/16 1118 by Joshue Badal D, MD   Sign clinical note    

## 2016-08-09 NOTE — Anesthesia Postprocedure Evaluation (Signed)
Anesthesia Post Note  Patient: Kristina [name]  Procedure(s) Performed: * No procedures listed *     Anesthesia Post Evaluation  Last Vitals: There were no vitals filed for this visit.  Last Pain: There were no vitals filed for this visit.               Shelton SilvasKevin D Hollis

## 2016-12-10 ENCOUNTER — Ambulatory Visit (INDEPENDENT_AMBULATORY_CARE_PROVIDER_SITE_OTHER): Payer: BLUE CROSS/BLUE SHIELD | Admitting: Advanced Practice Midwife

## 2016-12-10 DIAGNOSIS — B3741 Candidal cystitis and urethritis: Secondary | ICD-10-CM

## 2016-12-10 DIAGNOSIS — R300 Dysuria: Secondary | ICD-10-CM

## 2016-12-10 LAB — POCT URINALYSIS DIPSTICK
Glucose, UA: NEGATIVE
Ketones, UA: NEGATIVE
Leukocytes, UA: NEGATIVE
NITRITE UA: NEGATIVE
Protein, UA: NEGATIVE

## 2016-12-10 MED ORDER — FLUCONAZOLE 150 MG PO TABS
ORAL_TABLET | ORAL | 2 refills | Status: DC
Start: 2016-12-10 — End: 2017-05-26

## 2016-12-10 NOTE — Progress Notes (Signed)
[facility] Visit  Patient name: Kristina [name] MRN [id number]  Date of birth: 1990/05/05  CC & HPI:  Kristina [name] is a 26 y.o. Caucasian female presenting today for burning with urination for 3 weeks.. Really it is her vagina that is burning, not the urethra itself.  No other asso sx, smelled "strong" in the beginning, not so much now. Feels like sx are getting better.    Pertinent History Reviewed:  Medical & Surgical Hx:   Past Medical History:  Diagnosis Date  . Anemia   . Herpes   . Miscarriage   . Rh negative state in antepartum period   . Urinary tract infection   . Vaginal Pap smear, abnormal    Past Surgical History:  Procedure Laterality Date  . CHOLECYSTECTOMY N/A 01/04/2015   Procedure: LAPAROSCOPIC CHOLECYSTECTOMY;  Surgeon: Franky Macho, MD;  Location: [facility];  Service: General;  Laterality: N/A;  . LAPAROSCOPIC TUBAL LIGATION Bilateral 07/17/2016   Procedure: LAPAROSCOPIC BILATERAL STERILIZATION USING ELECTROCAUTERY;  Surgeon: Lazaro Arms, MD;  Location: [facility];  Service: Gynecology;  Laterality: Bilateral;  . TONSILLECTOMY     Family History  Problem Relation Age of Onset  . Cancer Maternal Grandmother        breast  . Diabetes Paternal Grandfather   . Jaundice Son     Current Outpatient Prescriptions:  .  fluconazole (DIFLUCAN) 150 MG tablet, 1 po stat; repeat in 3 days, Disp: 2 tablet, Rfl: 2 Social History: Reviewed -  reports that she quit smoking about 3 years ago. Her smoking use included Cigarettes. She has a 4.50 pack-year smoking history. She has never used smokeless tobacco.  Review of Systems:   Constitutional: Negative for fever and chills Eyes: Negative for visual disturbances Respiratory: Negative for shortness of breath, dyspnea Cardiovascular: Negative for chest pain or palpitations  Gastrointestinal: Negative for vomiting, diarrhea and constipation; no abdominal pain Genitourinary: Negative for dysuria and  urgency Musculoskeletal: Negative for back pain, joint pain, myalgias  Neurological: Negative for dizziness and headaches    Objective Findings:    Physical Examination: General appearance - well appearing, and in no distress Mental status - alert, oriented to person, place, and time Chest:  Normal respiratory effort Heart - normal rate and regular rhythm Abdomen:  Soft, nontender Pelvic: SSE:  Normal appearing scant white dishcarge with no odor.  Wet prep neg clue, neg wbc, few yeast, neg trich.  Musculoskeletal:  Normal range of motion without pain Extremities:  No edema    Results for orders placed or performed in visit on 12/10/16 (from the past 24 hour(s))  POCT urinalysis dipstick   Collection Time: 12/10/16  4:08 PM  Result Value Ref Range   Color, UA     Clarity, UA     Glucose, UA neg    Bilirubin, UA     Ketones, UA neg    Spec Grav, UA  1.010 - 1.025   Blood, UA trace    pH, UA  5.0 - 8.0   Protein, UA neg    Urobilinogen, UA  0.2 or 1.0 E.U./dL   Nitrite, UA neg    Leukocytes, UA Negative Negative      Assessment & Plan:  A:   Yeast, ? UTI P:  Culture urine, rx diflucan   Return for If you have any problems.  CRESENZO-DISHMAN,Kieffer Blatz CNM 12/10/2016 4:28 PM

## 2016-12-12 ENCOUNTER — Other Ambulatory Visit: Payer: Self-pay | Admitting: Advanced Practice Midwife

## 2016-12-12 ENCOUNTER — Encounter: Payer: Self-pay | Admitting: Advanced Practice Midwife

## 2016-12-12 LAB — URINE CULTURE

## 2016-12-12 MED ORDER — SULFAMETHOXAZOLE-TRIMETHOPRIM 800-160 MG PO TABS: 1.0000 | ORAL_TABLET | Freq: Two times a day (BID) | ORAL | 0 refills | Status: DC

## 2016-12-12 NOTE — Progress Notes (Signed)
[facility] for [place]

## 2017-05-05 ENCOUNTER — Encounter: Payer: Self-pay | Admitting: Women's Health

## 2017-05-24 ENCOUNTER — Encounter: Payer: Self-pay | Admitting: Women's Health

## 2017-05-26 ENCOUNTER — Encounter: Payer: Self-pay | Admitting: Women's Health

## 2017-05-26 ENCOUNTER — Other Ambulatory Visit: Payer: Self-pay | Admitting: Women's Health

## 2017-05-26 MED ORDER — FLUCONAZOLE 150 MG PO TABS: ORAL_TABLET | ORAL | 0 refills | Status: DC

## 2017-05-26 NOTE — Progress Notes (Signed)
Per MyChart email:   Hello,  I'm pretty positive that I have a yeast infection. I tried using the Monistat but it burns so bad I can't stand there...is there anything else that I can try for the yeast infection?     Thanks,  Kristina [name]    Rx diflucan, if sx don't improve make appt to be seen.  Cheral MarkerKimberly R. Katera Rybka, CNM, [facility] ClubWHNP-BC 05/26/2017 9:20 AM

## 2017-09-22 ENCOUNTER — Encounter: Payer: Self-pay | Admitting: Advanced Practice Midwife

## 2017-09-23 ENCOUNTER — Other Ambulatory Visit: Payer: Self-pay | Admitting: Advanced Practice Midwife

## 2017-09-23 ENCOUNTER — Encounter: Payer: Self-pay | Admitting: Advanced Practice Midwife

## 2017-09-23 MED ORDER — SULFAMETHOXAZOLE-TRIMETHOPRIM 800-160 MG PO TABS: 1.0000 | ORAL_TABLET | Freq: Two times a day (BID) | ORAL | 0 refills | Status: DC

## 2017-09-23 NOTE — Progress Notes (Signed)
Septra for pt call w/c/o UTI sx.

## 2018-01-06 ENCOUNTER — Other Ambulatory Visit: Payer: BLUE CROSS/BLUE SHIELD | Admitting: Obstetrics & Gynecology

## 2018-05-11 ENCOUNTER — Other Ambulatory Visit (HOSPITAL_COMMUNITY)
Admission: RE | Admit: 2018-05-11 | Discharge: 2018-05-11 | Disposition: A | Discharge status: Home / Self Care | Payer: BLUE CROSS/BLUE SHIELD | Source: Ambulatory Visit | Attending: Obstetrics & Gynecology | Admitting: Obstetrics & Gynecology

## 2018-05-11 ENCOUNTER — Encounter: Payer: Self-pay | Admitting: Obstetrics & Gynecology

## 2018-05-11 ENCOUNTER — Ambulatory Visit (INDEPENDENT_AMBULATORY_CARE_PROVIDER_SITE_OTHER): Payer: BLUE CROSS/BLUE SHIELD | Admitting: Obstetrics & Gynecology

## 2018-05-11 VITALS — BP 123/75 | HR 103 | Ht 68.25 in | Wt 262.5 lb

## 2018-05-11 DIAGNOSIS — Z01419 Encounter for gynecological examination (general) (routine) without abnormal findings: Secondary | ICD-10-CM

## 2018-05-11 NOTE — Progress Notes (Signed)
Subjective:     Kristina [name] is a 28 y.o. female here for a routine exam.  Patient's last menstrual period was 04/12/2018 (approximate). [id number] Birth Control Method:  Lap BTL Menstrual Calendar(currently): regular periods  Current complaints: none.   Current acute medical issues:  none   Recent Gynecologic History Patient's last menstrual period was 04/12/2018 (approximate). Last Pap: 2017,  ASCUS Last mammogram: ,    Past Medical History:  Diagnosis Date  . Anemia   . Herpes   . Miscarriage   . Rh negative state in antepartum period   . Urinary tract infection   . Vaginal Pap smear, abnormal     Past Surgical History:  Procedure Laterality Date  . CHOLECYSTECTOMY N/A 01/04/2015   Procedure: LAPAROSCOPIC CHOLECYSTECTOMY;  Surgeon: Franky Macho, MD;  Location: [facility];  Service: General;  Laterality: N/A;  . LAPAROSCOPIC TUBAL LIGATION Bilateral 07/17/2016   Procedure: LAPAROSCOPIC BILATERAL STERILIZATION USING ELECTROCAUTERY;  Surgeon: Lazaro Arms, MD;  Location: [facility];  Service: Gynecology;  Laterality: Bilateral;  . TONSILLECTOMY      OB History    Gravida  4   Para  3   Term  3   Preterm      AB  1   Living  3     SAB  1   TAB      Ectopic      Multiple  0   Live Births  3           Social History   Socioeconomic History  . Marital status: Married    Spouse name: Not on file  . Number of children: Not on file  . Years of education: Not on file  . Highest education level: Not on file  Occupational History  . Not on file  Social Needs  . Financial resource strain: Not on file  . Food insecurity:    Worry: Not on file    Inability: Not on file  . Transportation needs:    Medical: Not on file    Non-medical: Not on file  Tobacco Use  . Smoking status: Former Smoker    Packs/day: 0.50    Years: 9.00    Pack years: 4.50    Types: Cigarettes    Last attempt to quit: 01/01/2013    Years since quitting: 5.3  . Smokeless tobacco:  Never Used  Substance and Sexual Activity  . Alcohol use: Not Currently  . Drug use: No  . Sexual activity: Yes    Birth control/protection: Surgical    Comment: tubal  Lifestyle  . Physical activity:    Days per week: Not on file    Minutes per session: Not on file  . Stress: Not on file  Relationships  . Social connections:    Talks on phone: Not on file    Gets together: Not on file    Attends religious service: Not on file    Active member of club or organization: Not on file    Attends meetings of clubs or organizations: Not on file    Relationship status: Not on file  Other Topics Concern  . Not on file  Social History Narrative  . Not on file    Family History  Problem Relation Age of Onset  . Cancer Maternal Grandmother        breast  . Diabetes Paternal Grandfather   . Jaundice Son     No current outpatient medications on file.  Review  of Systems  Review of Systems  Constitutional: Negative for fever, chills, weight loss, malaise/fatigue and diaphoresis.  HENT: Negative for hearing loss, ear pain, nosebleeds, congestion, sore throat, neck pain, tinnitus and ear discharge.   Eyes: Negative for blurred vision, double vision, photophobia, pain, discharge and redness.  Respiratory: Negative for cough, hemoptysis, sputum production, shortness of breath, wheezing and stridor.   Cardiovascular: Negative for chest pain, palpitations, orthopnea, claudication, leg swelling and PND.  Gastrointestinal: negative for abdominal pain. Negative for heartburn, nausea, vomiting, diarrhea, constipation, blood in stool and melena.  Genitourinary: Negative for dysuria, urgency, frequency, hematuria and flank pain.  Musculoskeletal: Negative for myalgias, back pain, joint pain and falls.  Skin: Negative for itching and rash.  Neurological: Negative for dizziness, tingling, tremors, sensory change, speech change, focal weakness, seizures, loss of consciousness, weakness and  headaches.  Endo/Heme/Allergies: Negative for environmental allergies and polydipsia. Does not bruise/bleed easily.  Psychiatric/Behavioral: Negative for depression, suicidal ideas, hallucinations, memory loss and substance abuse. The patient is not nervous/anxious and does not have insomnia.        Objective:  Blood pressure 123/75, pulse (!) 103, height 5' 8.25" (1.734 m), weight 262 lb 8 oz (119.1 kg), last menstrual period 04/12/2018, not currently breastfeeding.   Physical Exam  Vitals reviewed. Constitutional: She is oriented to person, place, and time. She appears well-developed and well-nourished.  HENT:  Head: Normocephalic and atraumatic.        Right Ear: External ear normal.  Left Ear: External ear normal.  Nose: Nose normal.  Mouth/Throat: Oropharynx is clear and moist.  Eyes: Conjunctivae and EOM are normal. Pupils are equal, round, and reactive to light. Right eye exhibits no discharge. Left eye exhibits no discharge. No scleral icterus.  Neck: Normal range of motion. Neck supple. No tracheal deviation present. No thyromegaly present.  Cardiovascular: Normal rate, regular rhythm, normal heart sounds and intact distal pulses.  Exam reveals no gallop and no friction rub.   No murmur heard. Respiratory: Effort normal and breath sounds normal. No respiratory distress. She has no wheezes. She has no rales. She exhibits no tenderness.  GI: Soft. Bowel sounds are normal. She exhibits no distension and no mass. There is no tenderness. There is no rebound and no guarding.  Genitourinary:  Breasts no masses skin changes or nipple changes bilaterally      Vulva is normal without lesions Vagina is pink moist without discharge Cervix normal in appearance and pap is done Uterus is normal size shape and contour Adnexa is negative with normal sized ovaries   Musculoskeletal: Normal range of motion. She exhibits no edema and no tenderness.  Neurological: She is alert and oriented to  person, place, and time. She has normal reflexes. She displays normal reflexes. No cranial nerve deficit. She exhibits normal muscle tone. Coordination normal.  Skin: Skin is warm and dry. No rash noted. No erythema. No pallor.  Psychiatric: She has a normal mood and affect. Her behavior is normal. Judgment and thought content normal.       Medications Ordered at today's visit: No orders of the defined types were placed in this encounter.   Other orders placed at today's visit: No orders of the defined types were placed in this encounter.     Assessment:    Healthy female exam.    Plan:    Contraception: tubal ligation.     Return if symptoms worsen or fail to improve.

## 2018-05-13 LAB — CYTOLOGY - PAP
ADEQUACY: ABSENT
Diagnosis: NEGATIVE
HPV: NOT DETECTED

## 2020-07-06 ENCOUNTER — Telehealth: Payer: Self-pay | Admitting: Physician Assistant

## 2020-07-06 DIAGNOSIS — R300 Dysuria: Secondary | ICD-10-CM

## 2020-07-06 MED ORDER — CEPHALEXIN 500 MG PO CAPS: 500.0000 mg | ORAL_CAPSULE | Freq: Two times a day (BID) | ORAL | 0 refills | Status: AC

## 2020-07-06 NOTE — Progress Notes (Signed)
I have spent 5 minutes in review of e-visit questionnaire, review and updating patient chart, medical decision making and response to patient.   Roben Tatsch Cody Shahir Karen, PA-C    

## 2020-07-06 NOTE — Progress Notes (Signed)

## 2021-05-10 ENCOUNTER — Telehealth: Payer: Self-pay | Admitting: Family Medicine

## 2021-05-10 DIAGNOSIS — R300 Dysuria: Secondary | ICD-10-CM

## 2021-05-10 MED ORDER — NITROFURANTOIN MONOHYD MACRO 100 MG PO CAPS: 100.0000 mg | ORAL_CAPSULE | Freq: Two times a day (BID) | ORAL | 0 refills | Status: AC

## 2021-05-10 NOTE — Progress Notes (Signed)

## 2021-06-29 ENCOUNTER — Telehealth: Payer: Self-pay | Admitting: Physician Assistant

## 2021-06-29 DIAGNOSIS — A6000 Herpesviral infection of urogenital system, unspecified: Secondary | ICD-10-CM

## 2021-06-29 MED ORDER — VALACYCLOVIR HCL 500 MG PO TABS: 500.0000 mg | ORAL_TABLET | Freq: Two times a day (BID) | ORAL | 0 refills | Status: AC

## 2021-06-29 NOTE — Progress Notes (Signed)
I have spent 5 minutes in review of e-visit questionnaire, review and updating patient chart, medical decision making and response to patient.   Sharel Behne Cody Lyrick Worland, PA-C    

## 2021-06-29 NOTE — Progress Notes (Signed)
E-Visit for Herpes Simplex  We are sorry that you are not feeling well.  Here is how we plan to help!  Based on what you have shared ith me, it looks like you may be having an outbreak/flare-up of genital herpes.    I have prescribed I have prescribed Valacyclovir 500 mg Take one by mouth twice a day for 3 days.    If you have been prescribed long term medications to be taken on a regular basis, it is important to follow the recommendations and take them as ordered.    Outbreaks usually include blisters and open sores in the genital area. Outbreaks that happen after the first time are usually not as severe and do not last as long. Genital Herpes Simplex is a commonly sexually transmitted viral infection that is found worldwide. Most of these genital infections are caused by one or two herpes simplex viruses that is passed from person to person during vaginal, oral, or anal sex. Sometimes, people do not know they have herpes because they do not have any symptoms.  Please be aware that if you have genital herpes you can be contagious even when you are not having rash or flare-up and you may not have any symptoms, even when you are taking suppressive medicines.  Herpes cannot be cured. The disease usually causes most problems during the first few years. After that, the virus is still there, but it causes few to no symptoms. Even when the virus is active, people with herpes can take medicines to reduce and help prevent symptoms.  Herpes is an infection that can cause blisters and open sores on the genital area. Herpes is caused by a virus that is passed from person to person during vaginal, oral, or anal sex. Sometimes, people do not know they have herpes because they do not have any symptoms. Herpes cannot be cured. The disease usually causes most problems during the first few years. After that, the virus is still there, but it causes few to no symptoms. Even when the virus is active, people with herpes  can take medicines to reduce and help prevent symptoms.  If you have been prescribed medications to be taken on a regular basis, it is important to follow the recommendations and take them as ordered.  Some people with herpes never have any symptoms. But other people can develop symptoms within a few weeks of being infected with the herpes virus   Symptoms usually include blisters in the genital area. In women, this area includes the vagina, buttocks, anus, or thighs. In men, this area includes the penis, scrotum, anus, butt, or thighs. The blisters can become painful open sores, which then crust over as they heal. Sometimes, people can have other symptoms that include:  ?Blisters on the mouth or lips ?Fever, headache, or pain in the joints ?Trouble urinating  Outbreaks might occur every month or more often, or just once or twice a year. Sometimes, people can tell when an outbreak will occur, because they feel itching or pain beforehand. Sometimes they do not know that an outbreak is coming because they have no symptoms. Whatever your pattern is, keep in mind that herpes outbreaks usually become less frequent over time as you get older. Certain things, called "triggers," can make outbreaks more likely to occur. These include stress, sunlight, menstrual periods,or getting sick.  Antiviral therapy can shorten the duration of symptoms and signs in primary infection, which, when untreated, can be associated with significant increase in the   symptoms of the disease.  HOME CARE Use a portable bath (such as a "Sitz bath") where you can sit in warm water for about 20 minutes. Your bathtub could also work. Avoid bubble baths.  Keep the genital area clean and dry and avoid tight clothes.  Take over-the-counter pain medicine such as acetaminophen (brand name: Tylenol) or ibuprofen sample brand names: Advil, Motrin). But avoid aspirin.  Only take medications as instructed by your medical team.  You are  most likely to spread herpes to a sex partner when you have blisters and open sores on your body. But it's also possible to spread herpes to your partner when you do not have any symptoms. That is because herpes can be present on your body without causing any symptoms, like blisters or pain.  Telling your sex partner that you have herpes can be hard. But it can help protect them, since there are ways to lower the risk of spreading the infection.   Using a condom every time you have sex  Not having sex when you have symptoms  Not having oral sex if you have blisters or open sores (in the genital area or around your mouth)  MAKE SURE YOU   Understand these instructions. Do not have sex without using a condom until you have been seen by a doctor and as instructed by the provider If you are not better or improved within 7 days, you MUST have a follow up at your doctor or the health department for evaluation. There are other causes of rashes in the genital region.  Thank you for choosing an e-visit.  Your e-visit answers were reviewed by a board certified advanced clinical practitioner to complete your personal care plan. Depending upon the condition, your plan could have included both over the counter or prescription medications.  Please review your pharmacy choice. Make sure the pharmacy is open so you can pick up prescription now. If there is a problem, you may contact your provider through MyChart messaging and have the prescription routed to another pharmacy.  Your safety is important to us. If you have drug allergies check your prescription carefully.   For the next 24 hours you can use MyChart to ask questions about today's visit, request a non-urgent call back, or ask for a work or school excuse. You will get an email in the next two days asking about your experience. I hope that your e-visit has been valuable and will speed your recovery.      

## 2021-08-13 ENCOUNTER — Ambulatory Visit: Payer: 59 | Admitting: Obstetrics & Gynecology

## 2021-08-20 ENCOUNTER — Encounter: Payer: Self-pay | Admitting: Obstetrics & Gynecology

## 2021-08-20 ENCOUNTER — Ambulatory Visit (INDEPENDENT_AMBULATORY_CARE_PROVIDER_SITE_OTHER): Payer: 59 | Admitting: Obstetrics & Gynecology

## 2021-08-20 ENCOUNTER — Other Ambulatory Visit (HOSPITAL_COMMUNITY)
Admission: RE | Admit: 2021-08-20 | Discharge: 2021-08-20 | Disposition: A | Discharge status: Home / Self Care | Payer: 59 | Source: Ambulatory Visit | Attending: Obstetrics & Gynecology | Admitting: Obstetrics & Gynecology

## 2021-08-20 VITALS — BP 111/82 | HR 87 | Ht 68.25 in | Wt 268.0 lb

## 2021-08-20 DIAGNOSIS — Z01419 Encounter for gynecological examination (general) (routine) without abnormal findings: Secondary | ICD-10-CM | POA: Insufficient documentation

## 2021-08-20 DIAGNOSIS — N946 Dysmenorrhea, unspecified: Secondary | ICD-10-CM

## 2021-08-20 DIAGNOSIS — N920 Excessive and frequent menstruation with regular cycle: Secondary | ICD-10-CM

## 2021-08-20 MED ORDER — DESOGESTREL-ETHINYL ESTRADIOL 0.15-30 MG-MCG PO TABS: 1.0000 | ORAL_TABLET | Freq: Every day | ORAL | 11 refills | Status: DC

## 2021-08-20 NOTE — Progress Notes (Signed)
Subjective:     Kristina [name] is a 31 y.o. female here for a routine exam.  Patient's last menstrual period was 08/11/2021. [id number] Birth Control Method:  BTL Menstrual Calendar(currently): regular  Current complaints: menorrhagia dysmenorrhea-->recent change 1/23.   Current acute medical issues:     Recent Gynecologic History Patient's last menstrual period was 08/11/2021. Last Pap: 2020,  normal Last mammogram: n/a,    Past Medical History:  Diagnosis Date   Anemia    Herpes    Miscarriage    Rh negative state in antepartum period    Urinary tract infection    Vaginal Pap smear, abnormal     Past Surgical History:  Procedure Laterality Date   CHOLECYSTECTOMY N/A 01/04/2015   Procedure: LAPAROSCOPIC CHOLECYSTECTOMY;  Surgeon: Franky MachoMark Jenkins, MD;  Location: [facility];  Service: General;  Laterality: N/A;   LAPAROSCOPIC TUBAL LIGATION Bilateral 07/17/2016   Procedure: LAPAROSCOPIC BILATERAL STERILIZATION USING ELECTROCAUTERY;  Surgeon: Lazaro ArmsEure, Sumiya Mamaril H, MD;  Location: [facility];  Service: Gynecology;  Laterality: Bilateral;   TONSILLECTOMY      OB History     Gravida  4   Para  3   Term  3   Preterm      AB  1   Living  3      SAB  1   IAB      Ectopic      Multiple  0   Live Births  3           Social History   Socioeconomic History   Marital status: Married    Spouse name: Not on file   Number of children: Not on file   Years of education: Not on file   Highest education level: Not on file  Occupational History   Not on file  Tobacco Use   Smoking status: Former    Packs/day: 0.50    Years: 9.00    Total pack years: 4.50    Types: Cigarettes    Quit date: 01/01/2013    Years since quitting: 8.6   Smokeless tobacco: Never  Vaping Use   Vaping Use: Never used  Substance and Sexual Activity   Alcohol use: Not Currently   Drug use: No   Sexual activity: Yes    Birth control/protection: Surgical    Comment: tubal  Other Topics Concern    Not on file  Social History Narrative   Not on file   Social Determinants of Health   Financial Resource Strain: Low Risk  (08/20/2021)   Overall Financial Resource Strain (CARDIA)    Difficulty of Paying Living Expenses: Not hard at all  Food Insecurity: No Food Insecurity (08/20/2021)   Hunger Vital Sign    Worried About Running Out of Food in the Last Year: Never true    Ran Out of Food in the Last Year: Never true  Transportation Needs: No Transportation Needs (08/20/2021)   PRAPARE - Administrator, Civil ServiceTransportation    Lack of Transportation (Medical): No    Lack of Transportation (Non-Medical): No  Physical Activity: Insufficiently Active (08/20/2021)   Exercise Vital Sign    Days of Exercise per Week: 1 day    Minutes of Exercise per Session: 20 min  Stress: No Stress Concern Present (08/20/2021)   [employer] - Occupational Stress Questionnaire    Feeling of Stress : Not at all  Social Connections: Moderately Integrated (08/20/2021)   Social Connection and Isolation Panel [NHANES]    Frequency of  Communication with Friends and Family: More than three times a week    Frequency of Social Gatherings with Friends and Family: Twice a week    Attends Religious Services: More than 4 times per year    Active Member of [employer] or Organizations: No    Attends Engineer, structural: Never    Marital Status: Married    Family History  Problem Relation Age of Onset   Cancer Maternal Grandmother        breast   Diabetes Paternal Grandfather    Jaundice Son      Current Outpatient Medications:    desogestrel-ethinyl estradiol (APRI) 0.15-30 MG-MCG tablet, Take 1 tablet by mouth daily., Disp: 28 tablet, Rfl: 11  Review of Systems  Review of Systems  Constitutional: Negative for fever, chills, weight loss, malaise/fatigue and diaphoresis.  HENT: Negative for hearing loss, ear pain, nosebleeds, congestion, sore throat, neck pain, tinnitus and ear discharge.   Eyes:  Negative for blurred vision, double vision, photophobia, pain, discharge and redness.  Respiratory: Negative for cough, hemoptysis, sputum production, shortness of breath, wheezing and stridor.   Cardiovascular: Negative for chest pain, palpitations, orthopnea, claudication, leg swelling and PND.  Gastrointestinal: negative for abdominal pain. Negative for heartburn, nausea, vomiting, diarrhea, constipation, blood in stool and melena.  Genitourinary: Negative for dysuria, urgency, frequency, hematuria and flank pain.  Musculoskeletal: Negative for myalgias, back pain, joint pain and falls.  Skin: Negative for itching and rash.  Neurological: Negative for dizziness, tingling, tremors, sensory change, speech change, focal weakness, seizures, loss of consciousness, weakness and headaches.  Endo/Heme/Allergies: Negative for environmental allergies and polydipsia. Does not bruise/bleed easily.  Psychiatric/Behavioral: Negative for depression, suicidal ideas, hallucinations, memory loss and substance abuse. The patient is not nervous/anxious and does not have insomnia.        Objective:  Blood pressure 111/82, pulse 87, height 5' 8.25" (1.734 m), weight 268 lb (121.6 kg), last menstrual period 08/11/2021.   Physical Exam  Vitals reviewed. Constitutional: She is oriented to person, place, and time. She appears well-developed and well-nourished.  HENT:  Head: Normocephalic and atraumatic.        Right Ear: External ear normal.  Left Ear: External ear normal.  Nose: Nose normal.  Mouth/Throat: Oropharynx is clear and moist.  Eyes: Conjunctivae and EOM are normal. Pupils are equal, round, and reactive to light. Right eye exhibits no discharge. Left eye exhibits no discharge. No scleral icterus.  Neck: Normal range of motion. Neck supple. No tracheal deviation present. No thyromegaly present.  Cardiovascular: Normal rate, regular rhythm, normal heart sounds and intact distal pulses.  Exam reveals no  gallop and no friction rub.   No murmur heard. Respiratory: Effort normal and breath sounds normal. No respiratory distress. She has no wheezes. She has no rales. She exhibits no tenderness.  GI: Soft. Bowel sounds are normal. She exhibits no distension and no mass. There is no tenderness. There is no rebound and no guarding.  Genitourinary:  Breasts no masses skin changes or nipple changes bilaterally      Vulva is normal without lesions Vagina is pink moist without discharge Cervix normal in appearance and pap is done Uterus is normal size shape and contour Adnexa is negative with normal sized ovaries   Musculoskeletal: Normal range of motion. She exhibits no edema and no tenderness.  Neurological: She is alert and oriented to person, place, and time. She has normal reflexes. She displays normal reflexes. No cranial nerve deficit. She exhibits normal  muscle tone. Coordination normal.  Skin: Skin is warm and dry. No rash noted. No erythema. No pallor.  Psychiatric: She has a normal mood and affect. Her behavior is normal. Judgment and thought content normal.       Medications Ordered at today's visit: Meds ordered this encounter  Medications   desogestrel-ethinyl estradiol (APRI) 0.15-30 MG-MCG tablet    Sig: Take 1 tablet by mouth daily.    Dispense:  28 tablet    Refill:  11    Other orders placed at today's visit: No orders of the defined types were placed in this encounter.     Assessment:    Normal Gyn exam.       ICD-10-CM   1. Well woman exam with routine gynecological exam  Z01.419     2. Encounter for gynecological examination with Papanicolaou smear of cervix  Z01.419 Cytology - PAP( [facility])    3. Menorrhagia with regular cycle  N92.0    started about 1/23    4. Dysmenorrhea  N94.6    started about 1 /23      Plan:    Follow up in: 3 years. Begin COC      Return in about 3 years (around 08/20/2024) for yearly.

## 2021-08-21 LAB — CYTOLOGY - PAP
Comment: NEGATIVE
Diagnosis: NEGATIVE
High risk HPV: NEGATIVE

## 2022-03-24 ENCOUNTER — Telehealth: Payer: Self-pay | Admitting: Physician Assistant

## 2022-03-24 DIAGNOSIS — R3989 Other symptoms and signs involving the genitourinary system: Secondary | ICD-10-CM

## 2022-03-24 MED ORDER — CEPHALEXIN 500 MG PO CAPS: 500.0000 mg | ORAL_CAPSULE | Freq: Two times a day (BID) | ORAL | 0 refills | Status: DC

## 2022-03-24 NOTE — Progress Notes (Signed)

## 2023-05-01 ENCOUNTER — Telehealth: Payer: Self-pay

## 2023-05-01 DIAGNOSIS — B3731 Acute candidiasis of vulva and vagina: Secondary | ICD-10-CM

## 2023-05-02 MED ORDER — FLUCONAZOLE 150 MG PO TABS: 150.0000 mg | ORAL_TABLET | Freq: Every day | ORAL | 0 refills | Status: AC

## 2023-05-02 NOTE — Progress Notes (Signed)

## 2023-06-13 ENCOUNTER — Ambulatory Visit (INDEPENDENT_AMBULATORY_CARE_PROVIDER_SITE_OTHER): Admitting: Orthopedic Surgery

## 2023-06-13 ENCOUNTER — Other Ambulatory Visit (INDEPENDENT_AMBULATORY_CARE_PROVIDER_SITE_OTHER): Payer: Self-pay

## 2023-06-13 VITALS — BP 114/82 | HR 84 | Ht 68.0 in

## 2023-06-13 DIAGNOSIS — M79642 Pain in left hand: Secondary | ICD-10-CM

## 2023-06-13 DIAGNOSIS — M7989 Other specified soft tissue disorders: Secondary | ICD-10-CM

## 2023-06-15 ENCOUNTER — Encounter: Payer: Self-pay | Admitting: Orthopedic Surgery

## 2023-06-15 NOTE — Progress Notes (Signed)
 New Patient Visit  Assessment: Kristina [name] is a 33 y.o. female with the following: 1. Pain in left hand 2. Mass of soft tissue of hand  Plan: Kristina [name] has noticed a small growth of the volar aspect of the left small finger.  She has a small growth in the area of the volar fifth MCP joint, which could represent a cyst.  In addition, she has noted some swelling of the radial aspect of the PIP to the small finger.  She does note that they have gotten bigger at times.  Occasionally they are painful.  I would like to obtain an MRI to further assess these small growths.  She is agreement.  Once the MRI is complete, she will contact the clinic  Follow-up: Return for After MRI.  Subjective:  Chief Complaint  Patient presents with   Hand Pain    Left small finger and hand pain for one year, no injury.    History of Present Illness: Kristina [name] is a 33 y.o. female who has been referred by Jeb Levering, FNP for evaluation of left hand pain.  She is right-hand dominant.  For the past year, she has noted some small growths on the volar aspect of the small finger.  One of the areas overlying the MCP joint.  The other is overlying the PIP joint.  She states that they have gotten bigger at times.  She has not noticed any overlying skin changes.  No redness.  Occasionally, she will grab objects, and due to the location of the growth, it is painful.  Her health has remained stable.   Review of Systems: No fevers or chills No numbness or tingling No chest pain No shortness of breath No bowel or bladder dysfunction No GI distress No headaches   Medical History:  Past Medical History:  Diagnosis Date   Anemia    Herpes    Miscarriage    Rh negative state in antepartum period    Urinary tract infection    Vaginal Pap smear, abnormal     Past Surgical History:  Procedure Laterality Date   CHOLECYSTECTOMY N/A 01/04/2015   Procedure: LAPAROSCOPIC CHOLECYSTECTOMY;  Surgeon:  Franky Macho, MD;  Location: [facility];  Service: General;  Laterality: N/A;   LAPAROSCOPIC TUBAL LIGATION Bilateral 07/17/2016   Procedure: LAPAROSCOPIC BILATERAL STERILIZATION USING ELECTROCAUTERY;  Surgeon: Lazaro Arms, MD;  Location: [facility];  Service: Gynecology;  Laterality: Bilateral;   TONSILLECTOMY      Family History  Problem Relation Age of Onset   Cancer Maternal Grandmother        breast   Diabetes Paternal Grandfather    Jaundice Son    Social History   Tobacco Use   Smoking status: Former    Current packs/day: 0.00    Average packs/day: 0.5 packs/day for 9.0 years (4.5 ttl pk-yrs)    Types: Cigarettes    Start date: 01/02/2004    Quit date: 01/01/2013    Years since quitting: 10.4   Smokeless tobacco: Never  Vaping Use   Vaping status: Never Used  Substance Use Topics   Alcohol use: Not Currently   Drug use: No    No Known Allergies  No outpatient medications have been marked as taking for the 06/13/23 encounter (Office Visit) with Oliver Barre, MD.    Objective: BP 114/82   Pulse 84   Ht 5\' 8"  (1.727 m)   BMI 40.75 kg/m   Physical Exam:  General:  Alert and oriented. and No acute distress. Gait: Normal gait.  Evaluation of the left hand demonstrates no obvious deformity.  No redness.  No swelling.  Overlying the MCP to the small finger, there is a small, mobile growth, with some tenderness to palpation.  She has good grip strength.  Overlying the volar aspect of the PIP joint, there is a larger area which is not as fairly tender to palpation.  This is not as compressible.  Sensation is intact throughout the left hand.  IMAGING: I personally ordered and reviewed the following images   X-rays of the left small finger were obtained in clinic today.  Minimal degenerative changes noted.  No acute injuries.  No dislocation.  No calcifications are noted.  No bony lesions.  No underlying osseous changes.  Impression: Negative left small finger x-ray  New  Medications:  No orders of the defined types were placed in this encounter.     Oliver Barre, MD  06/15/2023 10:52 AM

## 2023-06-26 ENCOUNTER — Ambulatory Visit (HOSPITAL_COMMUNITY): Admission: RE | Admit: 2023-06-26 | Source: Ambulatory Visit

## 2024-03-13 ENCOUNTER — Telehealth: Admitting: Physician Assistant

## 2024-03-13 DIAGNOSIS — T3695XA Adverse effect of unspecified systemic antibiotic, initial encounter: Secondary | ICD-10-CM | POA: Diagnosis not present

## 2024-03-13 DIAGNOSIS — B379 Candidiasis, unspecified: Secondary | ICD-10-CM | POA: Diagnosis not present

## 2024-03-13 MED ORDER — FLUCONAZOLE 150 MG PO TABS: 150.0000 mg | ORAL_TABLET | Freq: Once | ORAL | 0 refills | Status: AC

## 2024-03-13 NOTE — Progress Notes (Signed)
